# Patient Record
Sex: Male | Born: 1950
Health system: Southern US, Community
[De-identification: ages and names within clinical notes are randomized; demographics above are authoritative.]

## PROBLEM LIST (undated history)

## (undated) DIAGNOSIS — I10 Essential (primary) hypertension: Secondary | ICD-10-CM

## (undated) DIAGNOSIS — Q211 Atrial septal defect: Secondary | ICD-10-CM

## (undated) DIAGNOSIS — E785 Hyperlipidemia, unspecified: Secondary | ICD-10-CM

## (undated) DIAGNOSIS — I341 Nonrheumatic mitral (valve) prolapse: Secondary | ICD-10-CM

## (undated) DIAGNOSIS — Z9289 Personal history of other medical treatment: Secondary | ICD-10-CM

## (undated) DIAGNOSIS — R931 Abnormal findings on diagnostic imaging of heart and coronary circulation: Secondary | ICD-10-CM

## (undated) DIAGNOSIS — C801 Malignant (primary) neoplasm, unspecified: Secondary | ICD-10-CM

## (undated) HISTORY — DX: Personal history of other medical treatment: Z92.89

## (undated) HISTORY — DX: Atrial septal defect: Q21.1

## (undated) HISTORY — DX: Hyperlipidemia, unspecified: E78.5

## (undated) HISTORY — DX: Nonrheumatic mitral (valve) prolapse: I34.1

## (undated) HISTORY — DX: Abnormal findings on diagnostic imaging of heart and coronary circulation: R93.1

## (undated) HISTORY — PX: COLONOSCOPY: SHX174

## (undated) HISTORY — PX: I & D EXTREMITY: SHX5045

## (undated) HISTORY — DX: Malignant (primary) neoplasm, unspecified: C80.1

## (undated) HISTORY — DX: Essential (primary) hypertension: I10

---

## 1993-06-10 HISTORY — PX: PROSTATE SURGERY: SHX751

## 1998-06-02 ENCOUNTER — Emergency Department (HOSPITAL_COMMUNITY): Admission: EM | Admit: 1998-06-02 | Discharge: 1998-06-02 | Payer: Self-pay | Admitting: Internal Medicine

## 1998-06-02 ENCOUNTER — Encounter: Payer: Self-pay | Admitting: Internal Medicine

## 2002-03-22 ENCOUNTER — Ambulatory Visit (HOSPITAL_COMMUNITY): Admission: RE | Admit: 2002-03-22 | Discharge: 2002-03-22 | Payer: Self-pay | Admitting: Gastroenterology

## 2002-08-21 ENCOUNTER — Emergency Department (HOSPITAL_COMMUNITY): Admission: EM | Admit: 2002-08-21 | Discharge: 2002-08-21 | Payer: Self-pay | Admitting: Emergency Medicine

## 2002-08-22 ENCOUNTER — Emergency Department (HOSPITAL_COMMUNITY): Admission: EM | Admit: 2002-08-22 | Discharge: 2002-08-22 | Payer: Self-pay | Admitting: Emergency Medicine

## 2005-06-10 DIAGNOSIS — I341 Nonrheumatic mitral (valve) prolapse: Secondary | ICD-10-CM

## 2005-06-10 DIAGNOSIS — Q211 Atrial septal defect, unspecified: Secondary | ICD-10-CM

## 2005-06-10 HISTORY — DX: Nonrheumatic mitral (valve) prolapse: I34.1

## 2005-06-10 HISTORY — PX: HERNIA REPAIR: SHX51

## 2005-06-10 HISTORY — PX: MITRAL VALVE REPAIR: SHX2039

## 2005-06-10 HISTORY — DX: Atrial septal defect: Q21.1

## 2005-06-10 HISTORY — PX: CARDIAC CATHETERIZATION: SHX172

## 2005-06-10 HISTORY — DX: Atrial septal defect, unspecified: Q21.10

## 2006-01-20 ENCOUNTER — Ambulatory Visit: Payer: Self-pay | Admitting: Internal Medicine

## 2006-02-05 ENCOUNTER — Ambulatory Visit (HOSPITAL_COMMUNITY): Admission: RE | Admit: 2006-02-05 | Discharge: 2006-02-05 | Payer: Self-pay | Admitting: Cardiology

## 2006-02-05 ENCOUNTER — Encounter (INDEPENDENT_AMBULATORY_CARE_PROVIDER_SITE_OTHER): Payer: Self-pay | Admitting: Cardiology

## 2006-06-10 DIAGNOSIS — Z9289 Personal history of other medical treatment: Secondary | ICD-10-CM

## 2006-06-10 HISTORY — DX: Personal history of other medical treatment: Z92.89

## 2006-06-26 ENCOUNTER — Ambulatory Visit: Payer: Self-pay | Admitting: Psychology

## 2006-10-23 DIAGNOSIS — Z87898 Personal history of other specified conditions: Secondary | ICD-10-CM

## 2006-10-23 DIAGNOSIS — Z8546 Personal history of malignant neoplasm of prostate: Secondary | ICD-10-CM | POA: Insufficient documentation

## 2006-10-24 ENCOUNTER — Encounter: Payer: Self-pay | Admitting: Internal Medicine

## 2006-10-24 ENCOUNTER — Ambulatory Visit: Payer: Self-pay | Admitting: Internal Medicine

## 2006-12-19 ENCOUNTER — Encounter: Payer: Self-pay | Admitting: Internal Medicine

## 2007-09-28 ENCOUNTER — Telehealth: Payer: Self-pay | Admitting: Internal Medicine

## 2007-09-30 ENCOUNTER — Telehealth (INDEPENDENT_AMBULATORY_CARE_PROVIDER_SITE_OTHER): Payer: Self-pay | Admitting: *Deleted

## 2007-10-02 ENCOUNTER — Encounter: Payer: Self-pay | Admitting: Internal Medicine

## 2007-10-06 ENCOUNTER — Encounter: Payer: Self-pay | Admitting: Internal Medicine

## 2007-10-14 ENCOUNTER — Encounter: Payer: Self-pay | Admitting: Internal Medicine

## 2007-10-21 ENCOUNTER — Ambulatory Visit: Payer: Self-pay | Admitting: Internal Medicine

## 2007-10-21 DIAGNOSIS — C61 Malignant neoplasm of prostate: Secondary | ICD-10-CM

## 2007-10-21 DIAGNOSIS — E785 Hyperlipidemia, unspecified: Secondary | ICD-10-CM

## 2007-10-21 DIAGNOSIS — I080 Rheumatic disorders of both mitral and aortic valves: Secondary | ICD-10-CM

## 2007-10-21 LAB — CONVERTED CEMR LAB
Bilirubin Urine: NEGATIVE
Blood in Urine, dipstick: NEGATIVE
Glucose, Urine, Semiquant: NEGATIVE
Ketones, urine, test strip: NEGATIVE
Nitrite: NEGATIVE
Protein, U semiquant: NEGATIVE
Specific Gravity, Urine: 1.01
Urobilinogen, UA: NEGATIVE
WBC Urine, dipstick: NEGATIVE
pH: 6

## 2008-09-02 ENCOUNTER — Ambulatory Visit: Payer: Self-pay | Admitting: Internal Medicine

## 2008-10-04 ENCOUNTER — Encounter (INDEPENDENT_AMBULATORY_CARE_PROVIDER_SITE_OTHER): Payer: Self-pay | Admitting: *Deleted

## 2008-10-12 ENCOUNTER — Encounter: Payer: Self-pay | Admitting: Internal Medicine

## 2008-10-17 ENCOUNTER — Ambulatory Visit: Payer: Self-pay | Admitting: Internal Medicine

## 2008-10-17 LAB — CONVERTED CEMR LAB
Bilirubin Urine: NEGATIVE
Blood in Urine, dipstick: NEGATIVE
Glucose, Urine, Semiquant: NEGATIVE
Ketones, urine, test strip: NEGATIVE
Nitrite: NEGATIVE
Protein, U semiquant: NEGATIVE
Specific Gravity, Urine: 1.02
Urobilinogen, UA: 0.2
WBC Urine, dipstick: NEGATIVE
pH: 6

## 2008-11-01 ENCOUNTER — Encounter: Payer: Self-pay | Admitting: Internal Medicine

## 2009-02-22 ENCOUNTER — Encounter: Payer: Self-pay | Admitting: Internal Medicine

## 2009-06-22 ENCOUNTER — Telehealth (INDEPENDENT_AMBULATORY_CARE_PROVIDER_SITE_OTHER): Payer: Self-pay | Admitting: *Deleted

## 2010-06-13 ENCOUNTER — Encounter: Payer: Self-pay | Admitting: Internal Medicine

## 2010-06-20 ENCOUNTER — Encounter: Payer: Self-pay | Admitting: Internal Medicine

## 2010-06-27 ENCOUNTER — Encounter: Payer: Self-pay | Admitting: Internal Medicine

## 2010-06-29 ENCOUNTER — Encounter: Payer: Self-pay | Admitting: Internal Medicine

## 2010-06-29 ENCOUNTER — Other Ambulatory Visit: Payer: Self-pay | Admitting: Internal Medicine

## 2010-06-29 ENCOUNTER — Ambulatory Visit
Admission: RE | Admit: 2010-06-29 | Discharge: 2010-06-29 | Payer: Self-pay | Source: Home / Self Care | Attending: Internal Medicine | Admitting: Internal Medicine

## 2010-06-29 DIAGNOSIS — I10 Essential (primary) hypertension: Secondary | ICD-10-CM | POA: Insufficient documentation

## 2010-06-29 DIAGNOSIS — R51 Headache: Secondary | ICD-10-CM | POA: Insufficient documentation

## 2010-06-29 DIAGNOSIS — R519 Headache, unspecified: Secondary | ICD-10-CM | POA: Insufficient documentation

## 2010-06-29 DIAGNOSIS — R5383 Other fatigue: Secondary | ICD-10-CM

## 2010-06-29 DIAGNOSIS — R5381 Other malaise: Secondary | ICD-10-CM | POA: Insufficient documentation

## 2010-06-29 LAB — CBC WITH DIFFERENTIAL/PLATELET
Basophils Absolute: 0 10*3/uL (ref 0.0–0.1)
Basophils Relative: 0.4 % (ref 0.0–3.0)
Eosinophils Absolute: 0.1 10*3/uL (ref 0.0–0.7)
Eosinophils Relative: 1.1 % (ref 0.0–5.0)
HCT: 43 % (ref 39.0–52.0)
Hemoglobin: 14.6 g/dL (ref 13.0–17.0)
Lymphocytes Relative: 19.2 % (ref 12.0–46.0)
Lymphs Abs: 1.5 10*3/uL (ref 0.7–4.0)
MCHC: 33.8 g/dL (ref 30.0–36.0)
MCV: 90 fl (ref 78.0–100.0)
Monocytes Absolute: 0.7 10*3/uL (ref 0.1–1.0)
Monocytes Relative: 8.9 % (ref 3.0–12.0)
Neutro Abs: 5.5 10*3/uL (ref 1.4–7.7)
Neutrophils Relative %: 70.4 % (ref 43.0–77.0)
Platelets: 222 10*3/uL (ref 150.0–400.0)
RBC: 4.78 Mil/uL (ref 4.22–5.81)
RDW: 13.6 % (ref 11.5–14.6)
WBC: 7.9 10*3/uL (ref 4.5–10.5)

## 2010-06-29 LAB — SEDIMENTATION RATE: Sed Rate: 10 mm/hr (ref 0–22)

## 2010-07-02 ENCOUNTER — Telehealth (INDEPENDENT_AMBULATORY_CARE_PROVIDER_SITE_OTHER): Payer: Self-pay | Admitting: *Deleted

## 2010-07-10 NOTE — Progress Notes (Signed)
Summary: vaccine info  Phone Note Call from Patient   Caller: Patient email Summary of Call: pt sent dr hopper E-mail wanting to know if we have certain vaccine(hep A&B, tetanus,typhoid) that he need for his [place] trip. left message to call office................Marland KitchenFelecia Deloach CMA  June 22, 2009 10:33 AM  left message to call office..................Marland KitchenFelecia Deloach CMA  June 23, 2009 8:33 AM  left message to call office.................Marland KitchenFelecia Deloach CMA  June 26, 2009 8:22 AM   Follow-up for Phone Call        pt return call pt need to know last tetanus shot pt already recieved other vaccine [facility] pt advise of information...........Marland KitchenFelecia Deloach CMA  June 27, 2009 11:06 AM

## 2010-07-12 NOTE — Progress Notes (Signed)
Summary: Lab results  Phone Note Outgoing Call Call back at [facility] Phone [phone]   Call placed by: Shonna Chock CMA,  July 02, 2010 4:54 PM Call placed to: Patient Details for Reason: Lab Results Summary of Call: Spoke with patient, all information below ok'd. Copy of labs mailed  Great results; no blood count abnormalities; sed rate measures inflammation . This low value rules out Polymyalgia Rheumatica. Please report any change in symptoms or progression. William Lewis CMA  July 02, 2010 4:55 PM

## 2010-07-12 NOTE — Assessment & Plan Note (Signed)
Summary: headache, achey, nausea, ///sph   Vital Signs:  Patient profile:   60 year old male Height:      71.5 inches (181.61 cm) Weight:      219.13 pounds (99.60 kg) BMI:     30.25 Temp:     98.1 degrees F (36.72 degrees C) oral Resp:     15 per minute BP sitting:   140 / 100  (left arm) Cuff size:   regular  Vitals Entered By: Lucious Groves CMA (June 29, 2010 12:30 PM) CC: Not feeling well-Acut onset of HA, nausea, and neck ache that began on Monday./kb, Headaches Is Patient Diabetic? No Comments Patient denies fever, vomiting, abd pain, cough, and congestion. It seems to be worsened by coffee and somewhat relieved by Advil.   CC:  Not feeling well-Acut onset of HA, nausea, and neck ache that began on Monday./kb, and Headaches.  History of Present Illness:    Onset 01/16 as malaise followed by joint & muscle pain , especially  upper back & neck with headache. The patient reports nausea, but denies vomiting, sweats, tearing of eyes, nasal congestion, sinus pain, sinus pressure, photophobia, and phonophobia.  The headache is described as intermittent and dull.  The location of the pain is  bitemporal as "pressure" .  High-risk features (red flags) include new type of headache and age >50 years.  The patient denies the following high-risk features: fever, vision loss or change, focal weakness, altered mental status, rash, and pain worse with exertion.  The headaches are precipitated by  coffee.  Prior treatment has included a NSAID with some response. No PMH of migraines; "I never get headaches".  He had Flu shot in Oct 2011. BP was 128/90 @ Dr Landry Dyke 01/18; Altace was increased.  Current Medications (verified): 1)  Metoprolol Succinate 25 Mg Xr24h-Tab (Metoprolol Succinate) .... 1/2 Tab Two Times A Day 2)  Altace 5 Mg Caps (Ramipril) .Marland Kitchen.. 1 By Mouth Once Daily 3)  Crestor 10 Mg Tabs (Rosuvastatin Calcium) .Marland Kitchen.. 1 By Mouth Qd 4)  Asa 81mg  .... 1 By Mouth Once Daily  Allergies  (verified): No Known Drug Allergies  Physical Exam  General:  well-nourished,in no acute distress; alert,appropriate and cooperative throughout examination Head:  Normocephalic and atraumatic without obvious abnormalities. No  temple or cervical spine tenderness Eyes:  No corneal or conjunctival inflammation noted. EOMI. Perrla. Field of Vision grossly normal.Slight ptosis OS Ears:  External ear exam shows no significant lesions or deformities.  Otoscopic examination reveals clear canals, tympanic membranes are intact bilaterally without bulging, retraction, inflammation or discharge. Hearing is grossly normal bilaterally.Some wax Nose:  External nasal examination shows no deformity or inflammation. Nasal mucosa are pink and moist without lesions or exudates. Mouth:  Oral mucosa and oropharynx without lesions or exudates.  Teeth in good repair. No tongue deviation. Slight decrease L nasolabial fold Neck:  No deformities, masses, or tenderness noted. Slight pain with L lat rotation Heart:  Normal rate and regular rhythm. S1 and S2 normal without gallop, murmur, click, rub or other extra sounds. Pulses:  R and L carotid  pulses are full and equal bilaterally w/o bruits Neurologic:  alert & oriented X3, cranial nerves II-XII intact, strength normal in all extremities, sensation intact to light touch, gait normal, DTRs symmetrical and normal, finger-to-nose normal, and Romberg negative.   Skin:  Intact without suspicious lesions or rashes Cervical Nodes:  No lymphadenopathy noted Axillary Nodes:  No palpable lymphadenopathy Psych:  memory intact for recent and  remote, normally interactive, and good eye contact.     Impression & Recommendations:  Problem # 1:  HEADACHE (ICD-784.0) R/O PMR His updated medication list for this problem includes:    Metoprolol Succinate 25 Mg Xr24h-tab (Metoprolol succinate) .Marland Kitchen... 1/2 tab two times a day    Tramadol Hcl 50 Mg Tabs (Tramadol hcl) .Marland Kitchen... 1 every 6 hrs  as needed for headache  Orders: Venipuncture (04540) TLB-Sedimentation Rate (ESR) (85652-ESR) Prescription Created Electronically [phone])  Problem # 2:  MALAISE (ICD-780.79)  Orders: Venipuncture (14782) TLB-CBC Platelet - w/Differential (85025-CBCD)  Problem # 3:  HYPERTENSION (ICD-401.9) suboptimal control His updated medication list for this problem includes:    Metoprolol Succinate 25 Mg Xr24h-tab (Metoprolol succinate) .Marland Kitchen... 1/2 tab two times a day    Altace 5 Mg Caps (Ramipril) .Marland Kitchen... 1 by mouth once daily  Complete Medication List: 1)  Metoprolol Succinate 25 Mg Xr24h-tab (Metoprolol succinate) .... 1/2 tab two times a day 2)  Altace 5 Mg Caps (Ramipril) .Marland Kitchen.. 1 by mouth once daily 3)  Crestor 10 Mg Tabs (Rosuvastatin calcium) .Marland Kitchen.. 1 by mouth qd 4)  Asa 81mg   .... 1 by mouth once daily 5)  Tramadol Hcl 50 Mg Tabs (Tramadol hcl) .Marland Kitchen.. 1 every 6 hrs as needed for headache  Patient Instructions: 1)  Check your Blood Pressure regularly. If it is above: 135/85 ON AVERAGE increase Metoprolol to  25 mg two times a day . Prescriptions: TRAMADOL HCL 50 MG TABS (TRAMADOL HCL) 1 every 6 hrs as needed for headache  #30 x 0   Entered and Authorized by:   Marga Melnick MD   Signed by:   Marga Melnick MD on 06/29/2010   Method used:   Electronically to        [facility]. [phone]* (retail)       [address]       [place], [place]  [address]       Ph: [phone]       Fax: [phone]   RxID:   [id number]    Orders Added: 1)  Est. Patient Level IV [95638] 2)  Venipuncture [75643] 3)  TLB-CBC Platelet - w/Differential [85025-CBCD] 4)  TLB-Sedimentation Rate (ESR) [85652-ESR] 5)  Prescription Created Electronically [phone]  Appended Document: headache, achey, nausea, ///sph

## 2010-07-18 NOTE — Letter (Signed)
Summary: E Mail from Patient with Concerns  E Mail from Patient with Concerns   Imported By: Lanelle Bal 07/10/2010 12:25:24  _____________________________________________________________________  External Attachment:    Type:   Image     Comment:   External Document

## 2010-07-26 NOTE — Letter (Signed)
Summary: [facility]  [facility]   Imported By: Lanelle Bal 07/16/2010 14:14:30  _____________________________________________________________________  External Attachment:    Type:   Image     Comment:   External Document

## 2010-10-26 NOTE — Letter (Signed)
January 28, 2006     Lennette Bihari, MD  [address]  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  At that time, I felt that his murmur was at least a 1-1/2 to a 2 on  a scale of 6.  At his last FAA exam, the murmur was graded as a grade 1/2.   When I obtained your evaluation of November 13, 2005, you also felt that there  was at least a grade 2 systolic murmur and had recommended a transesophageal  echocardiogram for further evaluation.  Specifically, you were concerned  about valvular heart disease or a patent foramen ovale.   With this significant increase in the grade of the murmur, he cannot be  cleared for [facility] certification until your evaluation is completed and the FAA  has had a chance to review this data.   I called him personally and informed him of this;  I will help him pursue  the certification after we have your complete evaluation.   Sincerely,     Titus Dubin. Alwyn Ren, MD, FACP, [facility]   [facility]  DD:  01/28/2006  DT:  01/29/2006  Job #:  [id number]  CC:   Mosetta Putt, MD  [name]

## 2011-03-26 ENCOUNTER — Encounter: Payer: Self-pay | Admitting: Internal Medicine

## 2011-03-27 ENCOUNTER — Encounter: Payer: Self-pay | Admitting: Internal Medicine

## 2011-03-27 ENCOUNTER — Ambulatory Visit (INDEPENDENT_AMBULATORY_CARE_PROVIDER_SITE_OTHER): Payer: BC Managed Care – PPO | Admitting: Internal Medicine

## 2011-03-27 VITALS — BP 130/84 | HR 65 | Temp 98.3°F | Resp 12 | Ht 72.0 in | Wt 215.4 lb

## 2011-03-27 DIAGNOSIS — I10 Essential (primary) hypertension: Secondary | ICD-10-CM

## 2011-03-27 DIAGNOSIS — Z23 Encounter for immunization: Secondary | ICD-10-CM

## 2011-03-27 DIAGNOSIS — Z8546 Personal history of malignant neoplasm of prostate: Secondary | ICD-10-CM

## 2011-03-27 DIAGNOSIS — I080 Rheumatic disorders of both mitral and aortic valves: Secondary | ICD-10-CM

## 2011-03-27 DIAGNOSIS — Z125 Encounter for screening for malignant neoplasm of prostate: Secondary | ICD-10-CM

## 2011-03-27 DIAGNOSIS — Z0000 Encounter for general adult medical examination without abnormal findings: Secondary | ICD-10-CM

## 2011-03-27 DIAGNOSIS — E785 Hyperlipidemia, unspecified: Secondary | ICD-10-CM

## 2011-03-27 NOTE — Patient Instructions (Addendum)
Preventive Health Care: Exercise at least 30-45 minutes a day,  3-4 days a week.  Eat a low-fat diet with lots of fruits and vegetables, up to 7-9 servings per day. Consume less than 40 grams of sugar per day from foods & drinks with High Fructose Corn Sugar as # 1,2,3 or # 4 on label. Recommended annual  fasting Labs : BMET,Lipids, hepatic panel, CBC & dif, TSH, PSA.  Blood Pressure Goal  Ideally is an AVERAGE < 135/85. This AVERAGE should be calculated from @ least 5-7 BP readings taken @ different times of day on different days of week. You should not respond to isolated BP readings , but rather the AVERAGE for that week

## 2011-03-27 NOTE — Progress Notes (Signed)
  Subjective:    Patient ID: William Lewis, male    DOB: February 05, 1951, 60 y.o.   MRN: [id number]  HPI  William Lewis  is here for a physical; he has no acute issues.     Review of Systems HYPERTENSION: Disease Monitoring: Blood pressure range-not monitored regularly  Chest pain, palpitations- no      Dyspnea- no Medications: Compliance- yes  Lightheadedness,Syncope- no    Edema- no  HYPERLIPIDEMIA: Medications: Compliance- yes  Abd pain, bowel changes- no   Muscle aches- no        Objective:   Physical Exam Gen.: Healthy and well-nourished in appearance. Alert, appropriate and cooperative throughout exam. Head: Normocephalic without obvious abnormalities Eyes: No corneal or conjunctival inflammation noted. Pupils equal round reactive to light and accommodation. Fundal exam is benign without hemorrhages, exudate, papilledema. Extraocular motion intact. Vision grossly normal. Ears: External  ear exam reveals no significant lesions or deformities. Canals clear .TMs normal. Hearing is grossly normal bilaterally. Nose: External nasal exam reveals no deformity or inflammation. Nasal mucosa are pink and moist. No lesions or exudates noted.  Mouth: Oral mucosa and oropharynx reveal no lesions or exudates. Teeth in good repair. Neck: No deformities, masses, or tenderness noted. Range of motion &  Thyroid normal. Lungs: Normal respiratory effort; chest expands symmetrically. Lungs are clear to auscultation without rales, wheezes, or increased work of breathing. Heart: Normal rate and rhythm. Normal S1 and S2. No gallop, click, or rub. S 4 with slight slurring ; no  murmur. Abdomen: Bowel sounds normal; abdomen soft and nontender. No masses, organomegaly or hernias noted. Genitalia/ DRE: negative genitalia except for mild varicoele on L ; PSA drawn   .                                                                                   Musculoskeletal/extremities: No deformity or scoliosis noted  of  the thoracic or lumbar spine. No clubbing, cyanosis, edema, or deformity noted. Range of motion  normal .Tone & strength  normal.Joints normal. Nail health  good. Vascular: Carotid, radial artery, dorsalis pedis and  posterior tibial pulses are full and equal. No bruits present. Neurologic: Alert and oriented x3. Deep tendon reflexes symmetrical and normal.          Skin: Intact without suspicious lesions or rashes. 3 x 3 mm papule 4th finger Lymph: No cervical, axillary, or inguinal lymphadenopathy present. Psych: Mood and affect are normal. Normally interactive                                                                                         Assessment & Plan:  #1 comprehensive physical exam; no acute findings #2 see Problem List with Assessments & Recommendations Plan: see Orders

## 2011-06-11 DIAGNOSIS — R931 Abnormal findings on diagnostic imaging of heart and coronary circulation: Secondary | ICD-10-CM

## 2011-06-11 HISTORY — DX: Abnormal findings on diagnostic imaging of heart and coronary circulation: R93.1

## 2011-12-23 ENCOUNTER — Ambulatory Visit (INDEPENDENT_AMBULATORY_CARE_PROVIDER_SITE_OTHER): Payer: BC Managed Care – PPO | Admitting: Family Medicine

## 2011-12-23 ENCOUNTER — Encounter: Payer: Self-pay | Admitting: Family Medicine

## 2011-12-23 VITALS — BP 132/80 | HR 60 | Temp 98.2°F | Ht 72.0 in | Wt 215.3 lb

## 2011-12-23 DIAGNOSIS — R5383 Other fatigue: Secondary | ICD-10-CM

## 2011-12-23 DIAGNOSIS — R5381 Other malaise: Secondary | ICD-10-CM

## 2011-12-23 DIAGNOSIS — R0789 Other chest pain: Secondary | ICD-10-CM

## 2011-12-23 NOTE — Progress Notes (Signed)
  Subjective:    Patient ID: William Lewis, male    DOB: February 23, 1951, 61 y.o.   MRN: [id number]  HPI Chest tightness and fatigue- 'i haven't been feeling that well.  i don't feel really bad, just a lack of energy'.  sxs started 2-4 weeks ago.  Has hx of mitral valve repair 2007- sees Dr Tresa Endo (cards).  Last seen 8 weeks ago and started on Vit D.  Played golf yesterday w/out difficulty.  Chest tightness is independent of exercise.  Admits to increased stress over the last few months.  Mild SOB at times.  No difference in sleep patterns/habits.  But is waking up and 'over analyzing things'.  No CP.  Chest tightness has no notable relation to food.  sxs seem to improve while golfing.   Review of Systems For ROS see HPI     Objective:   Physical Exam  Constitutional: He is oriented to person, place, and time. He appears well-developed and well-nourished. No distress.  HENT:  Head: Normocephalic and atraumatic.  Eyes: Conjunctivae and EOM are normal. Pupils are equal, round, and reactive to light.  Neck: Normal range of motion. Neck supple. No thyromegaly present.  Cardiovascular: Normal rate, regular rhythm and intact distal pulses.   Murmur (faint mitral click) heard. Pulmonary/Chest: Effort normal and breath sounds normal. No respiratory distress.  Abdominal: Soft. Bowel sounds are normal. He exhibits no distension.  Musculoskeletal: He exhibits no edema.  Lymphadenopathy:    He has no cervical adenopathy.  Neurological: He is alert and oriented to person, place, and time. No cranial nerve deficit.  Skin: Skin is warm and dry.  Psychiatric: He has a normal mood and affect. His behavior is normal.          Assessment & Plan:

## 2011-12-23 NOTE — Patient Instructions (Addendum)
We'll notify you of your lab results Try and keep track of where and when symptoms occur, how long your symptoms last, what makes them better or worse, etc This may all be stress related- try and find an outlet! Call with any questions or concerns Hang in there!

## 2011-12-24 LAB — CBC WITH DIFFERENTIAL/PLATELET
Basophils Absolute: 0 10*3/uL (ref 0.0–0.1)
Basophils Relative: 0.5 % (ref 0.0–3.0)
Eosinophils Absolute: 0.1 10*3/uL (ref 0.0–0.7)
Lymphocytes Relative: 21.9 % (ref 12.0–46.0)
MCHC: 32.5 g/dL (ref 30.0–36.0)
MCV: 90 fl (ref 78.0–100.0)
Monocytes Absolute: 0.8 10*3/uL (ref 0.1–1.0)
Neutrophils Relative %: 63.7 % (ref 43.0–77.0)
Platelets: 212 10*3/uL (ref 150.0–400.0)
RBC: 4.59 Mil/uL (ref 4.22–5.81)
RDW: 13.9 % (ref 11.5–14.6)

## 2011-12-24 LAB — BASIC METABOLIC PANEL
BUN: 19 mg/dL (ref 6–23)
GFR: 74.25 mL/min (ref >60.00)
Potassium: 4.7 mEq/L (ref 3.5–5.1)

## 2011-12-24 NOTE — Assessment & Plan Note (Signed)
New.  Since pt's sxs improve w/ activity and worsen w/ stress suspect that this is anxiety related.  EKG done- see document for interpretation- no cause for chest tightness identified.  Pt to try and monitor sxs- if they don't improve, will refer to cards for complete evaluation.  Reviewed supportive care and red flags that should prompt return.  Pt expressed understanding and is in agreement w/ plan.

## 2011-12-24 NOTE — Assessment & Plan Note (Signed)
New.  Check labs to r/o metabolic cause but again suspect this is due to pt's current stress level and anxiety.  Encouraged him to continue his regular exercise as this will be a good release.  Pt expressed understanding and is in agreement w/ plan.

## 2011-12-25 LAB — TSH: TSH: 2.25 u[IU]/mL (ref 0.35–5.50)

## 2012-03-30 ENCOUNTER — Encounter: Payer: Self-pay | Admitting: Internal Medicine

## 2012-03-30 ENCOUNTER — Ambulatory Visit (INDEPENDENT_AMBULATORY_CARE_PROVIDER_SITE_OTHER): Payer: BC Managed Care – PPO | Admitting: Internal Medicine

## 2012-03-30 VITALS — BP 126/84 | HR 70 | Temp 98.4°F | Resp 12 | Ht 72.5 in | Wt 213.4 lb

## 2012-03-30 DIAGNOSIS — E559 Vitamin D deficiency, unspecified: Secondary | ICD-10-CM

## 2012-03-30 NOTE — Patient Instructions (Addendum)
Preventive Health Care: Exercise at least 30-45 minutes a day,  3-4 days a week.  Eat a low-fat diet with lots of fruits and vegetables, up to 7-9 servings per day.  Consume less than 40 grams of sugar per day from foods & drinks with High Fructose Corn Sugar as # 1,2,3 or # 4 on label. Health Care Power of Attorney & Living Will. Complete if not in place ; these place you in charge of your health care decisions. Apply Cort Aid OTC twice a day to the involved tissues. Do not get this topical steroid into eyes. Use hypoallergenic cleansing motions.  Please  schedule fasting Labs : Lipids, hepatic panel, PSA. PLEASE BRING THESE INSTRUCTIONS TO FOLLOW UP  LAB APPOINTMENT.This will guarantee correct labs are drawn, eliminating need for repeat blood sampling ( needle sticks ! ). Diagnoses /Codes: V70.0  If you activate My Chart; the results can be released to you as soon as they populate from the lab. If you choose not to use this program; the labs have to be reviewed, copied & mailed   causing a delay in getting the results to you.

## 2012-03-30 NOTE — Progress Notes (Signed)
  Subjective:    Patient ID: William Lewis, male    DOB: 10/04/1950, 61 y.o.   MRN: [id number]  HPI William Lewis  is here for a physical; he has no acute issues .      Review of Systems Based on the results of the advanced cholesterol test; he was placed on 50,000 international units of vitamin D 3 for 6-8 weeks by his cardiologist. He has presently on 2000 international units daily. He is on a heart healthy diet and exercises on a regular basis. He walks at least a mile per day in addition to playing tennis. He denies associated chest pain, palpitations, dyspnea, or claudication. He is compliant with his statin; he denies abdominal pain, bowel change, or myalgias.  His wife noted a rash over the lower back area several days ago. It is nonpruritic; there is no historical clue to its etiology. He denies extrinsic symptoms such as itchy, watery eyes; sneezing; wheezing, or angioedema    Objective:   Physical Exam Gen.: Healthy and well-nourished in appearance. Alert, appropriate and cooperative throughout exam. Head: Normocephalic without obvious abnormalities  Eyes: No corneal or conjunctival inflammation noted. Pupils equal round reactive to light and accommodation. Fundal exam is benign without hemorrhages, exudate, papilledema. Extraocular motion intact. Vision grossly normal. Ears: External  ear exam reveals no significant lesions or deformities. Canals clear .TMs normal. Hearing is grossly normal bilaterally. Nose: External nasal exam reveals no deformity or inflammation. Nasal mucosa are pink and moist. No lesions or exudates noted.   Mouth: Oral mucosa and oropharynx reveal no lesions or exudates. Teeth in good repair. Neck: No deformities, masses, or tenderness noted. Range of motion & Thyroid normal. Lungs: Normal respiratory effort; chest expands symmetrically. Lungs are clear to auscultation without rales, wheezes, or increased work of breathing. Heart: Normal rate and rhythm. Normal  S1 and S2. No gallop, click, or rub. NO murmur. Abdomen: Bowel sounds normal; abdomen soft and nontender. No masses, organomegaly or hernias noted. Genitalia/DRE: Prostate surgically absent. Genital exam unremarkable except for small varices on the left Musculoskeletal/extremities: No deformity or scoliosis noted of  the thoracic or lumbar spine. No clubbing, cyanosis, edema, or deformity noted. Range of motion  normal .Tone & strength  normal.Joints normal. Nail health  good. Vascular: Carotid, radial artery, dorsalis pedis and  posterior tibial pulses are full and equal. No bruits present. Neurologic: Alert and oriented x3. Deep tendon reflexes symmetrical and normal.          Skin: She has scattered truncal plaques which are faintly pink and blanch. The largest is 3 x 5 cm over the lower mid back. Lymph: No cervical, axillary, or inguinal lymphadenopathy present. Psych: Mood and affect are normal. Normally interactive                                                                                          Assessment & Plan:  #1 comprehensive physical exam #2 truncal rash is asymptomatic; blanching characteristics suggest vascular etiology. #3 dyslipidemia monitored by his cardiologist. A copy of the  [facility] panel will be requested for his chart  Plan: see Orders

## 2012-03-31 ENCOUNTER — Other Ambulatory Visit (INDEPENDENT_AMBULATORY_CARE_PROVIDER_SITE_OTHER): Payer: BC Managed Care – PPO

## 2012-03-31 DIAGNOSIS — Z0000 Encounter for general adult medical examination without abnormal findings: Secondary | ICD-10-CM

## 2012-03-31 DIAGNOSIS — E559 Vitamin D deficiency, unspecified: Secondary | ICD-10-CM

## 2012-03-31 LAB — HEPATIC FUNCTION PANEL
ALT: 21 U/L (ref 0–53)
AST: 23 U/L (ref 0–37)
Bilirubin, Direct: 0.1 mg/dL (ref 0.0–0.3)
Total Bilirubin: 0.6 mg/dL (ref 0.3–1.2)
Total Protein: 6.9 g/dL (ref 6.0–8.3)

## 2012-03-31 LAB — LIPID PANEL
Cholesterol: 199 mg/dL (ref 0–200)
HDL: 57.5 mg/dL (ref >39.00)

## 2012-04-02 LAB — PSA: PSA: 0 ng/mL — ABNORMAL LOW (ref 0.10–4.00)

## 2012-04-05 ENCOUNTER — Encounter: Payer: Self-pay | Admitting: Internal Medicine

## 2012-04-06 LAB — VITAMIN D 1,25 DIHYDROXY: Vitamin D2 1, 25 (OH)2: 14 pg/mL

## 2012-04-20 ENCOUNTER — Encounter: Payer: Self-pay | Admitting: Internal Medicine

## 2012-04-21 NOTE — Telephone Encounter (Signed)
I have had same scheduling experience with that office if it makes you feel any better

## 2012-09-24 ENCOUNTER — Other Ambulatory Visit (HOSPITAL_COMMUNITY): Payer: Self-pay | Admitting: Cardiovascular Disease

## 2012-09-24 DIAGNOSIS — I10 Essential (primary) hypertension: Secondary | ICD-10-CM

## 2012-10-07 ENCOUNTER — Ambulatory Visit (HOSPITAL_COMMUNITY): Payer: BC Managed Care – PPO

## 2013-02-19 ENCOUNTER — Telehealth: Payer: Self-pay | Admitting: Cardiovascular Disease

## 2013-02-19 DIAGNOSIS — R5381 Other malaise: Secondary | ICD-10-CM

## 2013-02-19 DIAGNOSIS — E782 Mixed hyperlipidemia: Secondary | ICD-10-CM

## 2013-02-19 DIAGNOSIS — Z79899 Other long term (current) drug therapy: Secondary | ICD-10-CM

## 2013-02-19 NOTE — Telephone Encounter (Signed)
Would like to know if he needs to have lab work done before his visi t in November .Marland Kitchen If so please send him an order for lab work .   Thanks

## 2013-02-19 NOTE — Telephone Encounter (Signed)
Lab(s) ordered and directions to present FASTING to any [facility] to have completed.  Lab slip mailed.

## 2013-02-26 ENCOUNTER — Encounter: Payer: Self-pay | Admitting: Internal Medicine

## 2013-04-13 ENCOUNTER — Telehealth: Payer: Self-pay

## 2013-04-13 NOTE — Telephone Encounter (Addendum)
Left message for call back Identifiable  Medication List and allergies: reviewed and updated  90 day supply/mail order: [facility] Local prescriptions: [facility] [place]  Immunizations due: UTD  A/P:   No changes to FH or PSH (brother committed suicide) CCS--06/2012--Dr Medoff--polyps--2019 Tdap--08/2008 Zostavax--03/2011 PSA--03/2012--low 0.00 Flu Vaccine--02/2013  To Discuss with Provider: Has questions/will discuss with provider

## 2013-04-14 ENCOUNTER — Encounter: Payer: Self-pay | Admitting: Internal Medicine

## 2013-04-14 ENCOUNTER — Ambulatory Visit (INDEPENDENT_AMBULATORY_CARE_PROVIDER_SITE_OTHER): Payer: BC Managed Care – PPO | Admitting: Internal Medicine

## 2013-04-14 VITALS — BP 126/76 | HR 80 | Temp 98.8°F | Wt 214.0 lb

## 2013-04-14 DIAGNOSIS — E559 Vitamin D deficiency, unspecified: Secondary | ICD-10-CM

## 2013-04-14 DIAGNOSIS — R5381 Other malaise: Secondary | ICD-10-CM

## 2013-04-14 DIAGNOSIS — Z8546 Personal history of malignant neoplasm of prostate: Secondary | ICD-10-CM

## 2013-04-14 DIAGNOSIS — Z79899 Other long term (current) drug therapy: Secondary | ICD-10-CM

## 2013-04-14 DIAGNOSIS — Z0000 Encounter for general adult medical examination without abnormal findings: Secondary | ICD-10-CM

## 2013-04-14 DIAGNOSIS — R5383 Other fatigue: Secondary | ICD-10-CM

## 2013-04-14 DIAGNOSIS — Z86010 Personal history of colonic polyps: Secondary | ICD-10-CM | POA: Insufficient documentation

## 2013-04-14 DIAGNOSIS — N5231 Erectile dysfunction following radical prostatectomy: Secondary | ICD-10-CM

## 2013-04-14 DIAGNOSIS — N529 Male erectile dysfunction, unspecified: Secondary | ICD-10-CM

## 2013-04-14 LAB — CBC WITH DIFFERENTIAL/PLATELET
Basophils Absolute: 0 10*3/uL (ref 0.0–0.1)
Eosinophils Absolute: 0.1 10*3/uL (ref 0.0–0.7)
Eosinophils Relative: 1.4 % (ref 0.0–5.0)
HCT: 42.7 % (ref 39.0–52.0)
Lymphocytes Relative: 17.1 % (ref 12.0–46.0)
Lymphs Abs: 1.4 10*3/uL (ref 0.7–4.0)
Monocytes Relative: 7.8 % (ref 3.0–12.0)
Neutro Abs: 6 10*3/uL (ref 1.4–7.7)
Neutrophils Relative %: 73.2 % (ref 43.0–77.0)
Platelets: 230 10*3/uL (ref 150.0–400.0)
RDW: 14.5 % (ref 11.5–14.6)
WBC: 8.1 10*3/uL (ref 4.5–10.5)

## 2013-04-14 LAB — LIPID PANEL
Cholesterol: 179 mg/dL (ref 0–200)
HDL: 62.3 mg/dL (ref >39.00)
Total CHOL/HDL Ratio: 3
Triglycerides: 122 mg/dL (ref 0.0–149.0)
VLDL: 24.4 mg/dL (ref 0.0–40.0)

## 2013-04-14 LAB — COMPREHENSIVE METABOLIC PANEL
ALT: 21 U/L (ref 0–53)
BUN: 17 mg/dL (ref 6–23)
CO2: 27 mEq/L (ref 19–32)
Calcium: 9.5 mg/dL (ref 8.4–10.5)
Chloride: 102 mEq/L (ref 96–112)
GFR: 77.27 mL/min (ref >60.00)
Glucose, Bld: 89 mg/dL (ref 70–99)
Sodium: 137 mEq/L (ref 135–145)
Total Bilirubin: 0.4 mg/dL (ref 0.3–1.2)
Total Protein: 7 g/dL (ref 6.0–8.3)

## 2013-04-14 LAB — TSH: TSH: 2.39 u[IU]/mL (ref 0.35–5.50)

## 2013-04-14 LAB — PSA: PSA: 0 ng/mL — ABNORMAL LOW (ref 0.10–4.00)

## 2013-04-14 NOTE — Patient Instructions (Signed)
Your next office appointment will be determined based upon review of your pending labs. Those instructions will be transmitted to you through My Chart . 

## 2013-04-14 NOTE — Progress Notes (Signed)
  Subjective:    Patient ID: William Lewis, male    DOB: 05-Feb-1951, 62 y.o.   MRN: [id number]  HPI  He is here for a physical;acute issues denied.     Review of Systems A heart healthy diet is followed; exercise encompasses 30-60 minutes 2-3  times per week as walking, tennis, golf & biking without symptoms.  Family history is negative for premature coronary disease. There is medication compliance with the statin.  Low dose ASA taken. Specifically denied are  chest pain, palpitations, dyspnea, or claudication.  Significant abdominal symptoms, memory deficit, or myalgias not present.     Objective:   Physical Exam Gen.: Healthy and well-nourished in appearance. Alert, appropriate and cooperative throughout exam.  Head: Normocephalic without obvious abnormalities  Eyes: No corneal or conjunctival inflammation noted. Pupils equal round reactive to light and accommodation. Extraocular motion intact.  Ears: External  ear exam reveals no significant lesions or deformities. Canals clear .TMs normal. Hearing is grossly normal bilaterally. Nose: External nasal exam reveals no deformity or inflammation. Nasal mucosa are pink and moist. No lesions or exudates noted.  Mouth: Oral mucosa and oropharynx reveal no lesions or exudates. Teeth in good repair. Neck: No deformities, masses, or tenderness noted. Range of motion & Thyroid normal. Lungs: Normal respiratory effort; chest expands symmetrically. Lungs are clear to auscultation without rales, wheezes, or increased work of breathing. Heart: Normal rate and rhythm. Normal S1 and S2. No gallop, click, or rub. S4 w/o murmur. Abdomen: Bowel sounds normal; abdomen soft and nontender. No masses, organomegaly or hernias noted. Genitalia: Genitalia normal except for left  granuloma & varices. Prostate absent Musculoskeletal/extremities: No deformity or scoliosis noted of  the thoracic or lumbar spine.   No clubbing, cyanosis, edema, or significant  extremity  deformity noted. Range of motion normal .Tone & strength normal. Post op changes R thumb Hand joints normal . Fingernail / toenail health good. Able to lie down & sit up w/o help. Negative SLR bilaterally Vascular: Carotid, radial artery,  and  posterior tibial pulses are full and equal.Dorsalis pedis decreased but good hair growth over the feet and no ischemic skin changes. No bruits present. Neurologic: Alert and oriented x3. Deep tendon reflexes symmetrical and normal.        Skin: Intact without suspicious lesions or rashes. Lymph: No cervical, axillary, or inguinal lymphadenopathy present. Psych: Mood and affect are normal. Normally interactive                                                                                        Assessment & Plan:  #1 comprehensive physical exam; no acute findings  Plan: see Orders  & Recommendations

## 2013-04-14 NOTE — Addendum Note (Signed)
Addended by: Silvio Pate D on: 04/14/2013 10:46 AM   Modules accepted: Orders

## 2013-04-18 ENCOUNTER — Encounter: Payer: Self-pay | Admitting: Internal Medicine

## 2013-04-18 LAB — VITAMIN D 1,25 DIHYDROXY
Vitamin D2 1, 25 (OH)2: <8 pg/mL
Vitamin D3 1, 25 (OH)2: 64 pg/mL

## 2013-04-26 ENCOUNTER — Encounter: Payer: Self-pay | Admitting: Cardiology

## 2013-04-26 ENCOUNTER — Ambulatory Visit (HOSPITAL_COMMUNITY)
Admission: RE | Admit: 2013-04-26 | Discharge: 2013-04-26 | Disposition: A | Discharge status: Home / Self Care | Payer: BC Managed Care – PPO | Source: Ambulatory Visit | Attending: Cardiology | Admitting: Cardiology

## 2013-04-26 DIAGNOSIS — E785 Hyperlipidemia, unspecified: Secondary | ICD-10-CM | POA: Insufficient documentation

## 2013-04-26 DIAGNOSIS — I10 Essential (primary) hypertension: Secondary | ICD-10-CM | POA: Insufficient documentation

## 2013-04-26 DIAGNOSIS — Z9889 Other specified postprocedural states: Secondary | ICD-10-CM | POA: Insufficient documentation

## 2013-04-26 NOTE — Progress Notes (Signed)
2D Echo Performed 04/26/2013    Augustin Bun, RCS  

## 2013-04-27 ENCOUNTER — Encounter: Payer: Self-pay | Admitting: Cardiovascular Disease

## 2013-04-28 ENCOUNTER — Encounter: Payer: Self-pay | Admitting: Cardiovascular Disease

## 2013-04-28 ENCOUNTER — Ambulatory Visit (INDEPENDENT_AMBULATORY_CARE_PROVIDER_SITE_OTHER): Payer: BC Managed Care – PPO | Admitting: Cardiovascular Disease

## 2013-04-28 VITALS — BP 138/88 | HR 71 | Ht 72.0 in | Wt 214.8 lb

## 2013-04-28 DIAGNOSIS — I10 Essential (primary) hypertension: Secondary | ICD-10-CM

## 2013-04-28 DIAGNOSIS — Z9889 Other specified postprocedural states: Secondary | ICD-10-CM

## 2013-04-28 DIAGNOSIS — E785 Hyperlipidemia, unspecified: Secondary | ICD-10-CM

## 2013-04-28 NOTE — Patient Instructions (Signed)
Your physician recommends that you schedule a follow-up appointment in: 1 YEAR. No changes were made today in your therapy. 

## 2013-04-28 NOTE — Progress Notes (Signed)
Patient ID: William Lewis, male   DOB: November 10, 1950, 62 y.o.   MRN: [id number]     HPI: William Lewis is a 62 y.o. male who presents to the office for a six-month cardiology followup evaluation.  William Lewis underwent mitral valve repair robotically by Dr. Lamount Cohen [facility] a 04/10/2006 for significant mitral valve prolapse with partial flail posterior P2 leaflet. 4+ preoperative mitral regurgitation and also had an ASD as well as a patent foramen ovale which were also successfully repaired. At the time of his surgery, his left atrial appendage was closed.  Preoperatively, he was documented to have normal coronary arteries by cardiac catheterization. He recently underwent a 18 month followup echo Doppler study which was done this week and continues to show excellent result. Left ventricular ejection fraction was 60-65% and he did not have regional wall motion abnormalities. His mitral annular ring prosthesis was present. There is no mention in his current echo of any residual mitral regurgitation whereas on his prior rectal [facility] 2013 he was felt to have mild MR. Alert other valvular anatomy was normal.  She has remained fairly active. He plays tennis several days per week and also does ride a bike. He has had a stressful year this past year with his brother dying and having to deal with his estate, as well as additional care taking responsibilities for a loss. He is unaware of any chest tightness episodes. He does have a history of vitamin D insufficiency but read but recent laboratory done by Dr. Alwyn Ren now reveals a normal vitamin D level at 64. He is continued to take vitamin D3 2000 units daily. He does have a history of hypertension and this has been fairly well controlled on Avapro 5 mg as well as metoprolol succinate 25 mg daily which he has been taking a 12.5 twice a day regimen. Evaluate Dr. Alwyn Ren his blood pressure was stable. Another time when his blood pressure checked at the  [employer] was slightly elevated at approximately 140/88. He denies shortness of breath. He denies change in exercise. There is remote history of prostate CA which developed at age 61 at which time he underwent surgery.   Past Medical History  Diagnosis Date  . Other and unspecified hyperlipidemia     Dr Daphene Jaeger  . HTN (hypertension)   . MVP (mitral valve prolapse) 2007    mitral valve repair in [place]  . ASD (atrial septal defect) 2007    repaired with PFO in [place]  . Echocardiogram abnormal 2013    EF 50-55%, normal diastolic parameters, mild mitral regur. with his post leaflet of MV calcified  . History of stress test 2008    neg.for ischemia    Past Surgical History  Procedure Laterality Date  . Mitral valve repair  2007    Robotic surgery @ [address] , [place] and ASD repair and PFO repair  . Hernia repair  2007  . Prostate surgery  1995    TURP , Dr Aldean Ast  . Colonoscopy  2005 ; 2010;2014    polyp 2010 , Dr Kinnie Scales  . I&d extremity      thumb, for infection  . Cardiac catheterization  2007    EF 65-70%, significant mitral prolapse wth 4+ MR, normal coronary arteries     No Known Allergies  Current Outpatient Prescriptions  Medication Sig Dispense Refill  . aspirin 81 MG tablet Take 81 mg by mouth daily.        Marland Kitchen  Cholecalciferol (VITAMIN D3) 2000 UNITS TABS Take by mouth daily.      . fish oil-omega-3 fatty acids 1000 MG capsule Take by mouth daily.      . metoprolol succinate (TOPROL-XL) 25 MG 24 hr tablet Take 25 mg by mouth. 1/2 by twice daily      . ramipril (ALTACE) 5 MG capsule Take 5 mg by mouth daily.        . rosuvastatin (CRESTOR) 20 MG tablet Take 20 mg by mouth daily.       No current facility-administered medications for this visit.    History   Social History  . Marital Status: Married    Spouse Name: N/A    Number of Children: N/A  . Years of Education: N/A   Occupational History  . Not on file.   Social History Main Topics    . Smoking status: Former Smoker    Quit date: 06/10/1977  . Smokeless tobacco: Never Used     Comment: from age [age], up to 1 ppd or less  . Alcohol Use: 8.4 oz/week    14 Glasses of wine per week  . Drug Use: No  . Sexual Activity: Not on file   Other Topics Concern  . Not on file   Social History Narrative   Gets reg exercise    Family History  Problem Relation Age of Onset  . Hypertension Father   . Cancer Father     throat ; smoker  . Prostate cancer Father   . Stroke Paternal Grandmother     late 11s  . Heart disease Neg Hx   . Diabetes Neg Hx   . AAA (abdominal aortic aneurysm) Maternal Grandmother    Social history is notable that he is married he has 2 children. He does exercise. Is no tobacco or alcohol use.  ROS is negative for fevers, chills or night sweats. He denies skin changes. He denies visual symptoms. He denies PND orthopnea. He is unaware of palpitations. He denies chest pressure. He denies cough or sputum production wheezing. He denies change in bowel or bladder habits. He denies any GU or GI symptoms. He denies myalgias. There is no edema. Is no history of diabetes. No history of other endocrine problems. Other comprehensive 12 point system review is negative.  PE BP 138/88  Pulse 71  Ht 6' (1.829 m)  Wt 214 lb 12.8 oz (97.433 kg)  BMI 29.13 kg/m2  Repeat blood pressure taken by me was 122/84 General: Alert, oriented, no distress.  Skin: normal turgor, no rashes HEENT: Normocephalic, atraumatic. Pupils round and reactive; sclera anicteric;no lid lag.  Nose without nasal septal hypertrophy Mouth/Parynx benign; Mallinpatti scale 2 Neck: No JVD, no carotid briuts Lungs: clear to ausculatation and percussion; no wheezing or rales Heart: RRR, s1 s2 normal several systolic clicks. I did not hear any murmur of mitral regurgitation today. Abdomen: soft, nontender; no hepatosplenomehaly, BS+; abdominal aorta nontender and not dilated by palpation. Pulses  2+ Extremities: no clubbing cyanosis or edema, Homan's sign negative  Neurologic: grossly nonfocal Psychologic: normal affect and mood.  ECG: Sinus at 71 beats per minute. No significant change the  LABS:  BMET    Component Value Date/Time   NA 137 04/14/2013 1046   K 4.5 04/14/2013 1046   CL 102 04/14/2013 1046   CO2 27 04/14/2013 1046   GLUCOSE 89 04/14/2013 1046   BUN 17 04/14/2013 1046   CREATININE 1.0 04/14/2013 1046   CALCIUM 9.5 04/14/2013 1046  Hepatic Function Panel     Component Value Date/Time   PROT 7.0 04/14/2013 1046   ALBUMIN 4.3 04/14/2013 1046   AST 27 04/14/2013 1046   ALT 21 04/14/2013 1046   ALKPHOS 44 04/14/2013 1046   BILITOT 0.4 04/14/2013 1046   BILIDIR 0.1 03/31/2012 0907     CBC    Component Value Date/Time   WBC 8.1 04/14/2013 1046   RBC 4.78 04/14/2013 1046   HGB 13.9 04/14/2013 1046   HCT 42.7 04/14/2013 1046   PLT 230.0 04/14/2013 1046   MCV 89.2 04/14/2013 1046   MCHC 32.7 04/14/2013 1046   RDW 14.5 04/14/2013 1046   LYMPHSABS 1.4 04/14/2013 1046   MONOABS 0.6 04/14/2013 1046   EOSABS 0.1 04/14/2013 1046   BASOSABS 0.0 04/14/2013 1046     BNP No results found for this basename: probnp    Lipid Panel     Component Value Date/Time   CHOL 179 04/14/2013 1046   TRIG 122.0 04/14/2013 1046   HDL 62.30 04/14/2013 1046   CHOLHDL 3 04/14/2013 1046   VLDL 24.4 04/14/2013 1046   LDLCALC 92 04/14/2013 1046     RADIOLOGY: No results found.    ASSESSMENT AND PLAN: Cardiovascular standpoint, [name] is doing well now 7 years status post da Vinci robot mitral valve repair surgery with preoperative severe MR due to a ruptured chordae which time he had placement of 4 new Gore-Tex chordae, 2 from the medial, 2 from the lateral muscle which were brought to the edges of the posterior leaflet. He also had repair of a small atrial septal defect as well as a patent foramen ovale. Preoperatively he had normal coronary arteries. He is without anginal  symptomatology. His most recent echo Doppler study continues to show excellent repair. I did review the laboratory that was recently done done by Dr. Alwyn Ren. He is tolerating his dose of Crestor 20 mg and fish oil. His blood pressure is stable. Resting pulse is in the 70s. I did suggest that he does note more blood pressure lability in his blood pressure tends to consistently run over 140 systolically he can increase his Toprol to 37.5 mg rather than 25 mg daily. I will see him in one year for followup cardiologic evaluation or sooner if problems arise.     Lennette Bihari, MD, [facility]  04/28/2013 9:53 AM

## 2013-11-01 ENCOUNTER — Other Ambulatory Visit: Payer: Self-pay | Admitting: Cardiovascular Disease

## 2013-11-02 NOTE — Telephone Encounter (Signed)
Rx was sent to pharmacy electronically. 

## 2013-11-15 ENCOUNTER — Encounter: Payer: Self-pay | Admitting: Internal Medicine

## 2013-11-15 ENCOUNTER — Ambulatory Visit (INDEPENDENT_AMBULATORY_CARE_PROVIDER_SITE_OTHER): Payer: BC Managed Care – PPO | Admitting: Internal Medicine

## 2013-11-15 VITALS — BP 128/84 | HR 67 | Temp 98.1°F | Wt 215.6 lb

## 2013-11-15 DIAGNOSIS — L255 Unspecified contact dermatitis due to plants, except food: Secondary | ICD-10-CM

## 2013-11-15 DIAGNOSIS — L237 Allergic contact dermatitis due to plants, except food: Secondary | ICD-10-CM

## 2013-11-15 MED ORDER — PREDNISONE 20 MG PO TABS: 20.0000 mg | ORAL_TABLET | Freq: Two times a day (BID) | ORAL | Status: DC

## 2013-11-15 MED ORDER — MOMETASONE FUROATE 0.1 % EX OINT: TOPICAL_OINTMENT | Freq: Two times a day (BID) | CUTANEOUS | Status: DC

## 2013-11-15 MED ORDER — HYDROXYZINE HCL 10 MG PO TABS: 10.0000 mg | ORAL_TABLET | Freq: Four times a day (QID) | ORAL | Status: DC | PRN

## 2013-11-15 NOTE — Progress Notes (Signed)
   Subjective:    Patient ID: William Lewis, male    DOB: Sep 19, 1950, 63 y.o.   MRN: [id number]  HPI  He was exposed to poison ivy 11/10/13 in his backyard. This has progressed as papules which involve the extremities, thorax, neck, and face.  These are pruritic raised papules worse in the left antecubital area.  He believes that he's been exposed repeatedly by his dogs which play in the yard.  He used Community education officer and cortisone 10 with some benefit   Review of Systems   He specifically denies itchy, watery eyes, sneezing  He's had no swelling of his lips or tongue or other signs of angioedema  He has no wheezing, dyspnea,       Objective:   Physical Exam  General appearance:good health ;well nourished; no acute distress or increased work of breathing is present.  No  lymphadenopathy about the head, neck, or axilla noted.   Eyes: No conjunctival inflammation or lid edema is present. There is no scleral icterus.  Ears:  External ear exam shows no significant lesions or deformities.   Nose:  External nasal examination shows no deformity or inflammation. Nasal mucosa are pink and moist without lesions or exudates. No septal dislocation or deviation.No obstruction to airflow.   Oral exam: Dental hygiene is good; lips and gums are healthy appearing.There is no oropharyngeal erythema or exudate noted.   Neck:  No deformities, masses, or tenderness noted.     Heart:  Normal rate and regular rhythm. S1 and S2 normal without gallop, murmur, click, rub or other extra sounds.   Lungs:Chest clear to auscultation; no wheezes, rhonchi,rales ,or rubs present.No increased work of breathing.    Extremities:  No cyanosis, edema, or clubbing  noted    Skin: There are  slightly macerated papular lesions in left antecubital area which are erythematous. He has lesions over the anterior chest as well as right extremities well.         Assessment & Plan:  #1 severe , diffuse poison ivy w/o  associated respirstory complications See orders

## 2013-11-15 NOTE — Progress Notes (Signed)
Pre visit review using our clinic review tool, if applicable. No additional management support is needed unless otherwise documented below in the visit note. 

## 2013-11-15 NOTE — Patient Instructions (Signed)
   If you could possibly have been exposed to poison ivy; immediately shower & use soap liberally to remove oil 

## 2014-03-15 ENCOUNTER — Encounter: Payer: Self-pay | Admitting: Internal Medicine

## 2014-03-18 ENCOUNTER — Telehealth: Payer: Self-pay | Admitting: Cardiovascular Disease

## 2014-03-18 DIAGNOSIS — I10 Essential (primary) hypertension: Secondary | ICD-10-CM

## 2014-03-18 DIAGNOSIS — Z8546 Personal history of malignant neoplasm of prostate: Secondary | ICD-10-CM

## 2014-03-18 DIAGNOSIS — R5383 Other fatigue: Secondary | ICD-10-CM

## 2014-03-18 DIAGNOSIS — E559 Vitamin D deficiency, unspecified: Secondary | ICD-10-CM

## 2014-03-18 DIAGNOSIS — E785 Hyperlipidemia, unspecified: Secondary | ICD-10-CM

## 2014-03-18 DIAGNOSIS — Z79899 Other long term (current) drug therapy: Secondary | ICD-10-CM

## 2014-03-18 NOTE — Telephone Encounter (Signed)
Need a lab order sent to him before his appointment on 05/02/14 .Marland Kitchen   Thanks

## 2014-03-18 NOTE — Telephone Encounter (Signed)
LAB SLIP MAILED

## 2014-04-25 LAB — CBC WITH DIFFERENTIAL/PLATELET
Basophils Absolute: 0.1 10*3/uL (ref 0.0–0.1)
Basophils Relative: 1 % (ref 0–1)
EOS PCT: 2 % (ref 0–5)
Eosinophils Absolute: 0.1 10*3/uL (ref 0.0–0.7)
HCT: 42.1 % (ref 39.0–52.0)
Hemoglobin: 14.3 g/dL (ref 13.0–17.0)
Lymphocytes Relative: 24 % (ref 12–46)
Lymphs Abs: 1.2 10*3/uL (ref 0.7–4.0)
MCH: 29.1 pg (ref 26.0–34.0)
MCHC: 34 g/dL (ref 30.0–36.0)
MCV: 85.6 fL (ref 78.0–100.0)
MONOS PCT: 9 % (ref 3–12)
Monocytes Absolute: 0.5 10*3/uL (ref 0.1–1.0)
Neutro Abs: 3.3 10*3/uL (ref 1.7–7.7)
Neutrophils Relative %: 64 % (ref 43–77)
Platelets: 318 10*3/uL (ref 150–400)
RBC: 4.92 MIL/uL (ref 4.22–5.81)
RDW: 13.5 % (ref 11.5–15.5)
WBC: 5.1 10*3/uL (ref 4.0–10.5)

## 2014-04-25 LAB — COMPREHENSIVE METABOLIC PANEL
ALBUMIN: 4.2 g/dL (ref 3.5–5.2)
ALT: 16 U/L (ref 0–53)
AST: 15 U/L (ref 0–37)
Alkaline Phosphatase: 50 U/L (ref 39–117)
BUN: 19 mg/dL (ref 6–23)
CALCIUM: 9.4 mg/dL (ref 8.4–10.5)
CHLORIDE: 103 meq/L (ref 96–112)
CO2: 23 mEq/L (ref 19–32)
Creat: 0.95 mg/dL (ref 0.50–1.35)
GLUCOSE: 90 mg/dL (ref 70–99)
POTASSIUM: 4.7 meq/L (ref 3.5–5.3)
SODIUM: 137 meq/L (ref 135–145)
TOTAL PROTEIN: 6.8 g/dL (ref 6.0–8.3)
Total Bilirubin: 0.4 mg/dL (ref 0.2–1.2)

## 2014-04-25 LAB — LIPID PANEL
CHOL/HDL RATIO: 2.9 ratio
Cholesterol: 124 mg/dL (ref 0–200)
HDL: 43 mg/dL (ref >39)
LDL Cholesterol: 65 mg/dL (ref 0–99)
Triglycerides: 78 mg/dL (ref <150)
VLDL: 16 mg/dL (ref 0–40)

## 2014-04-25 LAB — TSH: TSH: 2.255 u[IU]/mL (ref 0.350–4.500)

## 2014-04-26 LAB — PSA: PSA: <0.01 ng/mL (ref <=4.00)

## 2014-05-02 ENCOUNTER — Encounter: Payer: Self-pay | Admitting: Cardiovascular Disease

## 2014-05-02 ENCOUNTER — Ambulatory Visit (INDEPENDENT_AMBULATORY_CARE_PROVIDER_SITE_OTHER): Payer: BC Managed Care – PPO | Admitting: Cardiovascular Disease

## 2014-05-02 VITALS — BP 130/80 | HR 57 | Ht 72.0 in | Wt 214.9 lb

## 2014-05-02 DIAGNOSIS — Z9889 Other specified postprocedural states: Secondary | ICD-10-CM

## 2014-05-02 DIAGNOSIS — I080 Rheumatic disorders of both mitral and aortic valves: Secondary | ICD-10-CM

## 2014-05-02 DIAGNOSIS — I10 Essential (primary) hypertension: Secondary | ICD-10-CM

## 2014-05-02 DIAGNOSIS — E785 Hyperlipidemia, unspecified: Secondary | ICD-10-CM

## 2014-05-02 NOTE — Progress Notes (Signed)
Patient ID: William Lewis, male   DOB: [birth year], 63 y.o.   MRN: [id number]     HPI: William Lewis is a 63 y.o. male who presents to the office for a one year cardiology followup evaluation.  [name] underwent mitral valve repair robotically by Dr. Elveria Rising [facility] a 04/10/2006 for significant mitral valve prolapse with partial flail posterior P2 leaflet. He had 4+ preoperative mitral regurgitation and also had an ASD as well as a patent foramen ovale which were also successfully repaired. At the time of his surgery, his left atrial appendage was closed.  Preoperatively, he was documented to have normal coronary arteries by cardiac catheterization. Last year he underwent a 18 month followup echo Doppler study which continued to show excellent result. Left ventricular ejection fraction was 60-65% and he did not have regional wall motion abnormalities. His mitral annular ring prosthesis was present. There is no mention in his current echo of any residual mitral regurgitation whereas on his prior echo in 2013 he was felt to have mild MR. All other valvular anatomy was normal.  He has a remote history of prostate CA which developed at age 5 at which time he underwent surgery.  Over the past year, William Lewis has continued to be active.  He plays tennis regularly.  He recently went on a bike trip to [place] and was biking up to 40 miles per day.  He tolerated this well.  Unfortunate, his mother died in 12-09-2022.  Suddenly secondary to apparent metastatic CA to her spine.  He is without chest pain.  He denies any arrhythmia.  He states his blood pressure has been well controlled.  He recently underwent a one-year follow-up blood work on 04/25/2014.  His cholesterol is 124, triglycerides 78, HDL 43, LDL 65.  TSH was normal at 2.25.  PSA remained less than 0.01.  His hemoglobin and hematocrit were stable at 14.3 and 42.1.  Renal function as well as LFTs were normal.   Past Medical History    Diagnosis Date  . Other and unspecified hyperlipidemia     Dr Ellouise Newer  . HTN (hypertension)   . MVP (mitral valve prolapse) 2007    mitral valve repair in [place]  . ASD (atrial septal defect) 2007    repaired with PFO in [place]  . Echocardiogram abnormal 2013    EF 41-74%, normal diastolic parameters, mild mitral regur. with his post leaflet of MV calcified  . History of stress test 2008    neg.for ischemia    Past Surgical History  Procedure Laterality Date  . Mitral valve repair  2007    Robotic surgery @ [address] , [place] and ASD repair and PFO repair  . Hernia repair  2007  . Prostate surgery  1995    TURP , Dr Serita Butcher  . Colonoscopy  2005 ; 2010;2014    polyp 2010 , Dr Earlean Shawl  . I&d extremity      thumb, for infection  . Cardiac catheterization  2007    EF 65-70%, significant mitral prolapse wth 4+ MR, normal coronary arteries     No Known Allergies  Current Outpatient Prescriptions  Medication Sig Dispense Refill  . aspirin 81 MG tablet Take 81 mg by mouth daily.      . Cholecalciferol (VITAMIN D3) 2000 UNITS TABS Take by mouth daily.    . CRESTOR 20 MG tablet TAKE 1 BY MOUTH AT BEDTIME 90 tablet 1  . fish oil-omega-3 fatty acids  1000 MG capsule Take by mouth daily.    . metoprolol succinate (TOPROL-XL) 25 MG 24 hr tablet TAKE 1/2 BY MOUTH TWICE DAILY 90 tablet 1  . ramipril (ALTACE) 5 MG capsule TAKE 1 BY MOUTH DAILY 90 capsule 1   No current facility-administered medications for this visit.    History   Social History  . Marital Status: Married    Spouse Name: N/A    Number of Children: N/A  . Years of Education: N/A   Occupational History  . Not on file.   Social History Main Topics  . Smoking status: Former Smoker    Quit date: 06/10/1977  . Smokeless tobacco: Never Used     Comment: from age [age], up to 1 ppd or less  . Alcohol Use: 8.4 oz/week    14 Glasses of wine per week  . Drug Use: No  . Sexual Activity: Not on file   Other  Topics Concern  . Not on file   Social History Narrative   Gets reg exercise    Family History  Problem Relation Age of Onset  . Hypertension Father   . Cancer Father     throat ; smoker  . Prostate cancer Father   . Stroke Paternal Grandmother     late 66s  . Heart disease Neg Hx   . Diabetes Neg Hx   . AAA (abdominal aortic aneurysm) Maternal Grandmother    Social history is notable that he is married he has 2 children. He does exercise. Is no tobacco or alcohol use.   \ROS General: Negative; No fevers, chills, or night sweats;  HEENT: Negative; No changes in vision or hearing, sinus congestion, difficulty swallowing Pulmonary: Negative; No cough, wheezing, shortness of breath, hemoptysis Cardiovascular: Negative; No chest pain, presyncope, syncope, palpitations GI: Negative; No nausea, vomiting, diarrhea, or abdominal pain GU: Negative; No dysuria, hematuria, or difficulty voiding Musculoskeletal: Negative; no myalgias, joint pain, or weakness Hematologic/Oncology: Negative; no easy bruising, bleeding Endocrine: Negative; no heat/cold intolerance; no diabetes Neuro: Negative; no changes in balance, headaches Skin: Negative; No rashes or skin lesions Psychiatric: Negative; No behavioral problems, depression Sleep: Negative; No snoring, daytime sleepiness, hypersomnolence, bruxism, restless legs, hypnogognic hallucinations, no cataplexy Other comprehensive 14 point system review is negative.  PE BP 130/80 mmHg  Pulse 57  Ht 6' (1.829 m)  Wt 214 lb 14.4 oz (97.478 kg)  BMI 29.14 kg/m2  Repeat blood pressure taken by me was 122/84 General: Alert, oriented, no distress.  Skin: normal turgor, no rashes HEENT: Normocephalic, atraumatic. Pupils round and reactive; sclera anicteric;no lid lag.  Nose without nasal septal hypertrophy Mouth/Parynx benign; Mallinpatti scale 2 Neck: No JVD, no carotid bruits with normal carotid upstroke Chest wall: Nontender to  palpation Lungs: clear to ausculatation and percussion; no wheezing or rales Heart: RRR, s1 s2 normal several systolic clicks. I did not hear any murmur of mitral regurgitation today. Abdomen: soft, nontender; no hepatosplenomehaly, BS+; abdominal aorta nontender and not dilated by palpation. Back: No CVA tenderness Pulses 2+ Extremities: no clubbing cyanosis or edema, Homan's sign negative  Neurologic: grossly nonfocal Psychologic: normal affect and mood.  ECG (independently read by me): NSR at 57; PR interval 220 ms.  QTc interval normal  Prior November 2014 ECG: Sinus at 71 beats per minute. No significant change the  LABS:  BMET    Component Value Date/Time   NA 137 04/14/2013 1046   K 4.5 04/14/2013 1046   CL 102 04/14/2013 1046  CO2 27 04/14/2013 1046   GLUCOSE 89 04/14/2013 1046   BUN 17 04/14/2013 1046   CREATININE 1.0 04/14/2013 1046   CALCIUM 9.5 04/14/2013 1046     Hepatic Function Panel     Component Value Date/Time   PROT 7.0 04/14/2013 1046   ALBUMIN 4.3 04/14/2013 1046   AST 27 04/14/2013 1046   ALT 21 04/14/2013 1046   ALKPHOS 44 04/14/2013 1046   BILITOT 0.4 04/14/2013 1046   BILIDIR 0.1 03/31/2012 0907     CBC    Component Value Date/Time   WBC 8.1 04/14/2013 1046   RBC 4.78 04/14/2013 1046   HGB 13.9 04/14/2013 1046   HCT 42.7 04/14/2013 1046   PLT 230.0 04/14/2013 1046   MCV 89.2 04/14/2013 1046   MCHC 32.7 04/14/2013 1046   RDW 14.5 04/14/2013 1046   LYMPHSABS 1.4 04/14/2013 1046   MONOABS 0.6 04/14/2013 1046   EOSABS 0.1 04/14/2013 1046   BASOSABS 0.0 04/14/2013 1046     BNP No results found for: PROBNP  Lipid Panel     Component Value Date/Time   CHOL 179 04/14/2013 1046   TRIG 122.0 04/14/2013 1046   HDL 62.30 04/14/2013 1046   CHOLHDL 3 04/14/2013 1046   VLDL 24.4 04/14/2013 1046   LDLCALC 92 04/14/2013 1046     RADIOLOGY: No results found.    ASSESSMENT AND PLAN:   William Lewis is doing well now 8 years status  post da Vinci robot mitral valve repair surgery with preoperative severe MR due to a ruptured chordae which time he had placement of 4 new Gore-Tex chordae, 2 from the medial, 2 from the lateral muscle which were brought to the edges of the posterior leaflet. He also had repair of a small atrial septal defect as well as a patent foramen ovale. Preoperatively he had normal coronary arteries. He is without anginal symptomatology. His echo Doppler study from one year ago  shows excellent repair. I did review the laboratory that he had done from last week.  I reviewed this with him in detail.  He remains active.  His blood pressure today is controlled on Toprol-XL 25 mg daily for which she's taken in the 12.5 twice a day regimen, and REM of April 5 milligrams.  He is on Crestor 20 mg and tolerating this well.  His weight is stable at 214.  He continues to exercise regularly.  He'll continue current therapy.  I will see him in one year for reevaluation, and prior to that office visit he will undergo a 2 year follow-up echo Doppler study.    Troy Sine, MD, [facility]  05/02/2014 3:39 PM

## 2014-05-02 NOTE — Patient Instructions (Signed)
Your physician has requested that you have an echocardiogram and office visit in 1 year.  Echocardiography is a painless test that uses sound waves to create images of your heart. It provides your doctor with information about the size and shape of your heart and how well your heart's chambers and valves are working. This procedure takes approximately one hour. There are no restrictions for this procedure.

## 2014-05-04 LAB — VITAMIN D 1,25 DIHYDROXY
Vitamin D 1, 25 (OH)2 Total: 45 pg/mL (ref 18–72)
Vitamin D2 1, 25 (OH)2: <8 pg/mL
Vitamin D3 1, 25 (OH)2: 45 pg/mL

## 2014-05-06 ENCOUNTER — Encounter: Payer: Self-pay | Admitting: Cardiovascular Disease

## 2014-05-09 ENCOUNTER — Encounter: Payer: Self-pay | Admitting: Internal Medicine

## 2014-05-16 ENCOUNTER — Ambulatory Visit (INDEPENDENT_AMBULATORY_CARE_PROVIDER_SITE_OTHER): Payer: BC Managed Care – PPO | Admitting: Internal Medicine

## 2014-05-16 ENCOUNTER — Encounter: Payer: Self-pay | Admitting: Internal Medicine

## 2014-05-16 VITALS — BP 108/70 | HR 63 | Temp 98.2°F | Resp 14 | Ht 72.0 in | Wt 215.0 lb

## 2014-05-16 DIAGNOSIS — Z0189 Encounter for other specified special examinations: Secondary | ICD-10-CM

## 2014-05-16 DIAGNOSIS — E785 Hyperlipidemia, unspecified: Secondary | ICD-10-CM

## 2014-05-16 DIAGNOSIS — Z0000 Encounter for general adult medical examination without abnormal findings: Secondary | ICD-10-CM

## 2014-05-16 DIAGNOSIS — Z23 Encounter for immunization: Secondary | ICD-10-CM

## 2014-05-16 NOTE — Patient Instructions (Signed)
Prevnar 13 recommended at this time with the pneumococcal 23 Valent immunization in 1 year.

## 2014-05-16 NOTE — Progress Notes (Signed)
   Subjective:    Patient ID: William Lewis, male    DOB: 11/15/1950, 63 y.o.   MRN: [id number]  HPI He is here for a physical;acute issues denied.  He is compliant with his statin and antihypertensive medications without adverse effects.  He's on a heart healthy diet. He exercises 3-4 times a week without cardiopulmonary symptoms.  There is no family history of premature coronary disease or heart attack. There is family history of intra-abdominal cancer. His colonoscopy is up-to-date.        Review of Systems  Significant headaches, epistaxis, chest pain, palpitations, exertional dyspnea, claudication, paroxysmal nocturnal dyspnea, or edema absent. No GI symptoms , memory loss or myalgias       Objective:   Physical Exam Gen.: Healthy and well-nourished in appearance. Alert, appropriate and cooperative throughout exam. Appears younger than stated age  Head: Normocephalic without obvious abnormalities; no alopecia  Eyes: No corneal or conjunctival inflammation noted. Pupils equal round reactive to light and accommodation. Extraocular motion intact.  Ears: External  ear exam reveals no significant lesions or deformities. Canals clear .TMs normal. Hearing is grossly normal bilaterally. Nose: External nasal exam reveals no deformity or inflammation. Nasal mucosa are pink and moist. No lesions or exudates noted.   Mouth: Oral mucosa and oropharynx reveal no lesions or exudates. Teeth in good repair. Neck: No deformities, masses, or tenderness noted. Range of motion & Thyroid normal. Lungs: Normal respiratory effort; chest expands symmetrically. Lungs are clear to auscultation without rales, wheezes, or increased work of breathing. Heart: Normal rate and rhythm. Normal S1 and S2. No gallop, click, or rub. No murmur. Repeat BP 120/82 Abdomen: Bowel sounds normal; abdomen soft and nontender. No masses, organomegaly or hernias noted. Genitalia: as per Urology                             Musculoskeletal/extremities: No deformity or scoliosis noted of  the thoracic or lumbar spine.  No clubbing, cyanosis, edema, or significant extremity  deformity noted.  Range of motion normal . Tone & strength normal. Hand joints normal   Fingernail / toenail health good. Minor crepitus of knees  Able to lie down & sit up w/o help.  Negative SLR bilaterally Vascular: Carotid, radial artery, dorsalis pedis and  posterior tibial pulses are full and equal. No bruits present. Neurologic: Alert and oriented x3. Deep tendon reflexes symmetrical and normal.  Gait normal .      Skin: Intact without suspicious lesions or rashes. Lymph: No cervical, axillary lymphadenopathy present. Psych: Mood and affect are normal. Normally interactive                                                                                        Assessment & Plan:  #1 comprehensive physical exam; no acute findings  Plan: see Orders  & Recommendations

## 2014-05-16 NOTE — Progress Notes (Signed)
Pre visit review using our clinic review tool, if applicable. No additional management support is needed unless otherwise documented below in the visit note. 

## 2014-06-14 ENCOUNTER — Other Ambulatory Visit: Payer: Self-pay | Admitting: *Deleted

## 2014-06-14 MED ORDER — METOPROLOL SUCCINATE ER 25 MG PO TB24: ORAL_TABLET | ORAL | Status: DC

## 2014-06-14 MED ORDER — RAMIPRIL 5 MG PO CAPS: ORAL_CAPSULE | ORAL | Status: DC

## 2014-06-14 MED ORDER — ROSUVASTATIN CALCIUM 20 MG PO TABS: ORAL_TABLET | ORAL | Status: DC

## 2015-01-31 ENCOUNTER — Encounter: Payer: Self-pay | Admitting: Cardiovascular Disease

## 2015-01-31 MED ORDER — ROSUVASTATIN CALCIUM 20 MG PO TABS: ORAL_TABLET | ORAL | Status: DC

## 2015-02-20 ENCOUNTER — Encounter: Payer: Self-pay | Admitting: Internal Medicine

## 2015-04-11 ENCOUNTER — Telehealth: Payer: Self-pay | Admitting: *Deleted

## 2015-04-11 NOTE — Telephone Encounter (Signed)
Patient signed ROI received via fax from [facility]. Forwarded to [place] to scan/email to medical records. JG//CMA

## 2015-05-01 ENCOUNTER — Encounter: Payer: Self-pay | Admitting: Cardiovascular Disease

## 2015-05-03 ENCOUNTER — Encounter: Payer: Self-pay | Admitting: Cardiovascular Disease

## 2015-05-03 ENCOUNTER — Ambulatory Visit (INDEPENDENT_AMBULATORY_CARE_PROVIDER_SITE_OTHER): Payer: BLUE CROSS/BLUE SHIELD | Admitting: Cardiovascular Disease

## 2015-05-03 VITALS — BP 144/98 | HR 64 | Ht 72.0 in | Wt 218.3 lb

## 2015-05-03 DIAGNOSIS — E785 Hyperlipidemia, unspecified: Secondary | ICD-10-CM

## 2015-05-03 DIAGNOSIS — E559 Vitamin D deficiency, unspecified: Secondary | ICD-10-CM

## 2015-05-03 DIAGNOSIS — Z9889 Other specified postprocedural states: Secondary | ICD-10-CM

## 2015-05-03 DIAGNOSIS — R5383 Other fatigue: Secondary | ICD-10-CM | POA: Diagnosis not present

## 2015-05-03 DIAGNOSIS — C61 Malignant neoplasm of prostate: Secondary | ICD-10-CM

## 2015-05-03 DIAGNOSIS — Z79899 Other long term (current) drug therapy: Secondary | ICD-10-CM | POA: Diagnosis not present

## 2015-05-03 DIAGNOSIS — Z125 Encounter for screening for malignant neoplasm of prostate: Secondary | ICD-10-CM

## 2015-05-03 DIAGNOSIS — I10 Essential (primary) hypertension: Secondary | ICD-10-CM

## 2015-05-03 LAB — LIPID PANEL
CHOL/HDL RATIO: 2.6 ratio (ref <=5.0)
Cholesterol: 156 mg/dL (ref 125–200)
HDL: 60 mg/dL (ref >=40)
LDL Cholesterol: 82 mg/dL (ref <130)
Triglycerides: 72 mg/dL (ref <150)
VLDL: 14 mg/dL (ref <30)

## 2015-05-03 LAB — COMPLETE METABOLIC PANEL WITH GFR
ALBUMIN: 4.2 g/dL (ref 3.6–5.1)
ALK PHOS: 52 U/L (ref 40–115)
ALT: 16 U/L (ref 9–46)
AST: 17 U/L (ref 10–35)
BILIRUBIN TOTAL: 0.5 mg/dL (ref 0.2–1.2)
BUN: 18 mg/dL (ref 7–25)
CALCIUM: 9.2 mg/dL (ref 8.6–10.3)
CO2: 27 mmol/L (ref 20–31)
Chloride: 104 mmol/L (ref 98–110)
Creat: 0.92 mg/dL (ref 0.70–1.25)
GFR, EST NON AFRICAN AMERICAN: 86 mL/min (ref >=60)
GLUCOSE: 94 mg/dL (ref 65–99)
POTASSIUM: 5 mmol/L (ref 3.5–5.3)
Sodium: 140 mmol/L (ref 135–146)
Total Protein: 6.7 g/dL (ref 6.1–8.1)

## 2015-05-03 LAB — TSH: TSH: 2.683 u[IU]/mL (ref 0.350–4.500)

## 2015-05-03 NOTE — Progress Notes (Signed)
Patient ID: William Lewis, male   DOB: 11-11-1950, 64 y.o.   MRN: [id number]     HPI: William Lewis is a 64 y.o. male who presents to the office for a one year cardiology followup evaluation.  [name] underwent mitral valve repair robotically by Dr. Elveria Rising [facility] a 04/10/2006 for significant mitral valve prolapse with partial flail posterior P2 leaflet. He had 4+ preoperative mitral regurgitation and also had an ASD as well as a patent foramen ovale which were also successfully repaired. At the time of his surgery, his left atrial appendage was closed.  Preoperatively, he was documented to have normal coronary arteries by cardiac catheterization. Last year he underwent a 18 month followup echo Doppler study which continued to show excellent result. Left ventricular ejection fraction was 60-65% and he did not have regional wall motion abnormalities. His mitral annular ring prosthesis was present. There is no mention in his current echo of any residual mitral regurgitation whereas on his prior echo in 2013 he was felt to have mild MR. All other valvular anatomy was normal.  He has a remote history of prostate CA which developed at age 28 at which time he underwent surgery.  Over the past year, [name] has continued to be active and feels that he has had a "good year."  He plays tennis regularly, approximately 2-3 days per week.  He rides a bike but has not been riding as much recently with the cold weather.  Last year he  went on a bike trip to [place] and was biking up to 40 miles per day.  He tolerated this well.  Over the past 2 years, he had lost his brother and last year lost his mother which had led to some increased stress. Presently, he is without chest pain. He denies any arrhythmia.  There is no change in exercise tolerance.  He denies any dyspnea.  He states his blood pressure has been well controlled.  Laboratory one year ago on 04/25/2014: cholesterol is 124, triglycerides  78, HDL 43, LDL 65.  TSH was normal at 2.25.  PSA remained less than 0.01.  His hemoglobin and hematocrit were stable at 14.3 and 42.1.  Renal function as well as LFTs were normal.  He is fasting today.   Past Medical History  Diagnosis Date  . Other and unspecified hyperlipidemia     Dr Ellouise Newer  . HTN (hypertension)   . MVP (mitral valve prolapse) 2007    mitral valve repair in [place]  . ASD (atrial septal defect) 2007    repaired with PFO in [place]  . Echocardiogram abnormal 2013    EF 53-29%, normal diastolic parameters, mild mitral regur. with his post leaflet of MV calcified  . History of stress test 2008    neg.for ischemia    Past Surgical History  Procedure Laterality Date  . Mitral valve repair  2007    Robotic surgery @ [address] , [place] and ASD repair and PFO repair  . Hernia repair  2007  . Prostate surgery  1995    TURP , Dr Serita Butcher  . Colonoscopy  2005 ; 2010;2014    polyp 2010 , Dr Earlean Shawl  . I&d extremity      thumb, for infection  . Cardiac catheterization  2007    EF 65-70%, significant mitral prolapse wth 4+ MR, normal coronary arteries     No Known Allergies  Current Outpatient Prescriptions  Medication Sig Dispense Refill  .  aspirin 81 MG tablet Take 81 mg by mouth daily.      . Cholecalciferol (VITAMIN D3) 2000 UNITS TABS Take by mouth daily.    . fish oil-omega-3 fatty acids 1000 MG capsule Take by mouth daily.    . metoprolol succinate (TOPROL-XL) 25 MG 24 hr tablet TAKE 1/2 BY MOUTH TWICE DAILY 90 tablet 3  . ramipril (ALTACE) 5 MG capsule TAKE 1 BY MOUTH DAILY 90 capsule 3  . rosuvastatin (CRESTOR) 20 MG tablet TAKE 1 BY MOUTH AT BEDTIME 90 tablet 3   No current facility-administered medications for this visit.    Social History   Social History  . Marital Status: Married    Spouse Name: N/A  . Number of Children: N/A  . Years of Education: N/A   Occupational History  . Not on file.   Social History Main Topics  . Smoking  status: Former Smoker    Quit date: 06/10/1977  . Smokeless tobacco: Never Used     Comment: from age [age], up to 1 ppd or less  . Alcohol Use: 8.4 oz/week    14 Glasses of wine per week  . Drug Use: No  . Sexual Activity: Not on file   Other Topics Concern  . Not on file   Social History Narrative   Gets reg exercise    Family History  Problem Relation Age of Onset  . Hypertension Father   . Cancer Father     throat ; smoker  . Prostate cancer Father   . Stroke Paternal Grandmother     late 26s  . Heart disease Neg Hx   . Diabetes Neg Hx   . AAA (abdominal aortic aneurysm) Maternal Grandmother   . Cancer Mother 45    ? intra-abdominal   Social history is notable that he is married he has 2 children. He does exercise. Is no tobacco or alcohol use.  His daughter is an attorney in [place] involved with healthcare and a large firm doing exceptionally well.  His son is working in [place].   ROS General: Negative; No fevers, chills, or night sweats;  HEENT: Negative; No changes in vision or hearing, sinus congestion, difficulty swallowing Pulmonary: Negative; No cough, wheezing, shortness of breath, hemoptysis Cardiovascular: Negative; No chest pain, presyncope, syncope, palpitations GI: Negative; No nausea, vomiting, diarrhea, or abdominal pain GU: Negative; No dysuria, hematuria, or difficulty voiding Musculoskeletal: Negative; no myalgias, joint pain, or weakness Hematologic/Oncology: Negative; no easy bruising, bleeding Endocrine: Negative; no heat/cold intolerance; no diabetes Neuro: Negative; no changes in balance, headaches Skin: Negative; No rashes or skin lesions Psychiatric: Negative; No behavioral problems, depression Sleep: Negative; No snoring, daytime sleepiness, hypersomnolence, bruxism, restless legs, hypnogognic hallucinations, no cataplexy Other comprehensive 14 point system review is negative.  PE BP 144/98 mmHg  Pulse 64  Ht 6' (1.829 m)  Wt 218  lb 4.8 oz (99.02 kg)  BMI 29.60 kg/m2  Repeat blood pressure taken by me was 128/86 supine and 130/86 standing.  Wt Readings from Last 3 Encounters:  05/03/15 218 lb 4.8 oz (99.02 kg)  05/16/14 215 lb (97.523 kg)  05/02/14 214 lb 14.4 oz (97.478 kg)   General: Alert, oriented, no distress.  Skin: normal turgor, no rashes HEENT: Normocephalic, atraumatic. Pupils round and reactive; sclera anicteric;no lid lag.  Nose without nasal septal hypertrophy Mouth/Parynx benign; Mallinpatti scale 2 Neck: No JVD, no carotid bruits with normal carotid upstroke Chest wall: Nontender to palpation Lungs: clear to ausculatation and percussion; no wheezing  or rales Heart: RRR, s1 s2 normal several systolic clicks. I did not hear any murmur of mitral regurgitation. Abdomen: soft, nontender; no hepatosplenomehaly, BS+; abdominal aorta nontender and not dilated by palpation. Back: No CVA tenderness Pulses 2+ Extremities: no clubbing cyanosis or edema, Homan's sign negative  Neurologic: grossly nonfocal Psychologic: normal affect and mood.  ECG (independently read by me): Normal sinus rhythm with an isolated PAC.  Normal intervals.  No ST segment changes.  November 2015 ECG (independently read by me): NSR at 57; PR interval 220 ms.  QTc interval normal  Prior November 2014 ECG: Sinus at 71 beats per minute. No significant change the  LABS:  BMP Latest Ref Rng 04/25/2014 04/14/2013 12/23/2011  Glucose 70 - 99 mg/dL 90 89 92  BUN 6 - 23 mg/dL 19 17 19   Creatinine 0.50 - 1.35 mg/dL 0.95 1.0 1.1  Sodium 135 - 145 mEq/L 137 137 146(H)  Potassium 3.5 - 5.3 mEq/L 4.7 4.5 4.7  Chloride 96 - 112 mEq/L 103 102 107  CO2 19 - 32 mEq/L 23 27 24   Calcium 8.4 - 10.5 mg/dL 9.4 9.5 9.5   Hepatic Function Latest Ref Rng 04/25/2014 04/14/2013 03/31/2012  Total Protein 6.0 - 8.3 g/dL 6.8 7.0 6.9  Albumin 3.5 - 5.2 g/dL 4.2 4.3 4.1  AST 0 - 37 U/L 15 27 23   ALT 0 - 53 U/L 16 21 21   Alk Phosphatase 39 - 117 U/L 50  44 42  Total Bilirubin 0.2 - 1.2 mg/dL 0.4 0.4 0.6  Bilirubin, Direct 0.0 - 0.3 mg/dL - - 0.1   CBC Latest Ref Rng 04/25/2014 04/14/2013 12/23/2011  WBC 4.0 - 10.5 K/uL 5.1 8.1 6.3  Hemoglobin 13.0 - 17.0 g/dL 14.3 13.9 13.4  Hematocrit 39.0 - 52.0 % 42.1 42.7 41.3  Platelets 150 - 400 K/uL 318 230.0 212.0   Lab Results  Component Value Date   MCV 85.6 04/25/2014   MCV 89.2 04/14/2013   MCV 90.0 12/23/2011   Lab Results  Component Value Date   TSH 2.255 04/25/2014  No results found for: HGBA1C   Lab Results  Component Value Date   PSA <0.01 04/25/2014   PSA 0.00* 04/14/2013   PSA 0.00 Repeated and verified X2.* 03/31/2012   Lipid Panel     Component Value Date/Time   CHOL 124 04/25/2014 0955   TRIG 78 04/25/2014 0955   HDL 43 04/25/2014 0955   CHOLHDL 2.9 04/25/2014 0955   VLDL 16 04/25/2014 0955   LDLCALC 65 04/25/2014 0955   RADIOLOGY: No results found.    ASSESSMENT AND PLAN:  Mr. 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 He also had repair of a small atrial septal defect as well as a patent foramen ovale. Preoperatively he had normal coronary arteries. He is without anginal symptomatology.  Over the past year.  He is remained entirely asymptomatic.  He exercises regularly.  He denies any change in exercise capacity.  Specifically, there are no arrhythmias.  He denies shortness of breath.  His cardiac exam today does not reveal any residual murmur.  His blood pressure today is controlled on ramipril 5 mg and Toprol-XL 12.5 mg which he takes twice  a day.  He continues to be on aspirin 81 mg.  He is on Crestor 20 mg for hyperlipidemia.  One year ago.  His LDL was excellent at 65.  He  is fasting today.  A complete set of blood work will be obtained and he also requested that I check his PSA and vitamin D level in addition to his other routine laboratory.  He will continue current therapy as prescribed.  I will contact him regarding the results of his blood work.  In one year, I amscheduling him for three-year follow-up echo Doppler study.  I will see him back in the office for reevaluation.  Time spent: 25 minutes Troy Sine, MD, [facility]  05/03/2015 10:47 AM

## 2015-05-03 NOTE — Patient Instructions (Signed)
Your physician wants you to follow-up in: 1 Year. You will receive a reminder letter in the mail two months in advance. If you don't receive a letter, please call our office to schedule the follow-up appointment.  Your physician has requested that you have an echocardiogram in 1 Year. Echocardiography is a painless test that uses sound waves to create images of your heart. It provides your doctor with information about the size and shape of your heart and how well your heart's chambers and valves are working. This procedure takes approximately one hour. There are no restrictions for this procedure.  Your physician recommends that you return for lab work in: Today  If you need a refill on your cardiac medications before your next appointment, please call your pharmacy.      Happy Thanksgiving!!!

## 2015-05-04 LAB — PSA

## 2015-05-04 LAB — VITAMIN D 25 HYDROXY (VIT D DEFICIENCY, FRACTURES): VIT D 25 HYDROXY: 40 ng/mL (ref 30–100)

## 2015-05-07 ENCOUNTER — Encounter: Payer: Self-pay | Admitting: Cardiovascular Disease

## 2015-05-08 ENCOUNTER — Other Ambulatory Visit: Payer: Self-pay | Admitting: *Deleted

## 2015-05-08 MED ORDER — RAMIPRIL 5 MG PO CAPS: ORAL_CAPSULE | ORAL | Status: DC

## 2015-05-08 MED ORDER — METOPROLOL SUCCINATE ER 25 MG PO TB24: ORAL_TABLET | ORAL | Status: DC

## 2015-05-08 MED ORDER — ROSUVASTATIN CALCIUM 20 MG PO TABS: ORAL_TABLET | ORAL | Status: DC

## 2015-05-08 NOTE — Telephone Encounter (Signed)
Refilled medications as requested 

## 2015-05-10 ENCOUNTER — Telehealth: Payer: Self-pay | Admitting: Internal Medicine

## 2015-05-10 NOTE — Telephone Encounter (Signed)
I called pt about his CPE in Dec.  He said that he knows you personally and would like to speak with you about transferring or what to do about his cpe.  Can you call him when you get a chance.

## 2015-05-10 NOTE — Telephone Encounter (Signed)
  I am transitioning to full retirement as of 06/09/2015. Over the next several weeks; I shall be in the clinic on an abbreviated schedule . You should transfer your primary care to another primary care provider to guarantee continuity of care. Dr Billey Gosling has joined [place] here @ [facility] ; I enthusiastically recommend her to you because of her clinical skills and compassion. Also Terri Piedra, NP @ [facility] is an outstanding Primary Care practitioner who truly cares about his patients. If you need medication refills;a physical or update of your medical record ;this would be an excellent opportunity to schedule an appointment with Dr Quay Burow or Marya Amsler.  It has been an Surveyor, minerals and blessing to have served you as your physician. I wish you the best in the future. [employer]

## 2015-05-19 ENCOUNTER — Encounter: Payer: Self-pay | Admitting: Internal Medicine

## 2015-05-19 ENCOUNTER — Ambulatory Visit (INDEPENDENT_AMBULATORY_CARE_PROVIDER_SITE_OTHER): Payer: BLUE CROSS/BLUE SHIELD | Admitting: Internal Medicine

## 2015-05-19 ENCOUNTER — Other Ambulatory Visit: Payer: BLUE CROSS/BLUE SHIELD

## 2015-05-19 VITALS — BP 124/70 | HR 75 | Temp 98.1°F | Ht 72.0 in | Wt 218.0 lb

## 2015-05-19 DIAGNOSIS — Z1159 Encounter for screening for other viral diseases: Secondary | ICD-10-CM

## 2015-05-19 DIAGNOSIS — Z0000 Encounter for general adult medical examination without abnormal findings: Secondary | ICD-10-CM

## 2015-05-19 DIAGNOSIS — Z23 Encounter for immunization: Secondary | ICD-10-CM

## 2015-05-19 NOTE — Progress Notes (Signed)
   Subjective:    Patient ID: William Lewis, male    DOB: [birth year], 64 y.o.   MRN: [id number]  HPI The patient is here for a physical  to assess status of active health conditions.  PMH, FH, & Social History reviewed & updated.No change in William Lewis as recorded.   He has been compliant with his medicines without adverse effects; he is on a heart healthy diet. He does want to lose 10 pounds.  He plays tennis 3 times a week for 2 hours and walks a mile at least 7 days a week. He has no associated cardiopulmonary symptoms.   He is not checking his blood pressure at home.  He quit smoking 1981. He will have 2-3 glasses of wine nightly.  Colonoscopy was normal in 2014; repeat is due 2024. He has no active GI symptoms  Review of systems is negative except for nocturia 1.  He sees his urologist annually; PSA is monitored.  His chart was reviewed. He had extensive lab studies in November of this year. Lipids were at goal. Thyroid function test and liver function tests were normal.  Review of Systems  Chest pain, palpitations, tachycardia, exertional dyspnea, paroxysmal nocturnal dyspnea, claudication or edema are absent. No unexplained weight loss, abdominal pain, significant dyspepsia, dysphagia, melena, rectal bleeding, or persistently small caliber stools. Dysuria, pyuria, hematuria, frequency, or polyuria are denied. Change in hair, skin, nails denied. No bowel changes of constipation or diarrhea. No intolerance to heat or cold.     Objective:   Physical Exam  Pertinent or positive findings include: There is slight alopecia over the crown. He has some crepitus the knees.  General appearance :adequately nourished; in no distress.  Eyes: No conjunctival inflammation or scleral icterus is present.  Oral exam:  Lips and gums are healthy appearing.There is no oropharyngeal erythema or exudate noted. Dental hygiene is good.  Heart:  Normal rate and regular rhythm. S1 and S2 normal  without gallop, murmur, click, rub or other extra sounds    Lungs:Chest clear to auscultation; no wheezes, rhonchi,rales ,or rubs present.No increased work of breathing.   Abdomen: bowel sounds normal, soft and non-tender without masses, organomegaly or hernias noted.  No guarding or rebound.   Vascular : all pulses equal ; no bruits present.  Skin:Warm & dry.  Intact without suspicious lesions or rashes ; no tenting or jaundice   Lymphatic: No lymphadenopathy is noted about the head, neck, axilla.   Neuro: Strength, tone & DTRs normal.     Assessment & Plan:  #1 comprehensive physical exam; no acute findings  Plan: see Orders  & Recommendations

## 2015-05-19 NOTE — Progress Notes (Signed)
Pre visit review using our clinic review tool, if applicable. No additional management support is needed unless otherwise documented below in the visit note. 

## 2015-05-19 NOTE — Patient Instructions (Addendum)
  The following nutritional changes may help weight loss.  #1 White carbohydrates (potatoes, rice, bread, and pasta) cause a high spike of the sugar level which stays elevated for a significant period of time (called sugar"load").  For example a  baked potato has a cup of sugar and a  french fry  2 teaspoons of sugar.  More complex carbs such as yams, wild  rice, whole grained bread &  wheat pasta have been much lower spike and persistent load of sugar than the white carbs.   #2 Consume less than 40 Grams (preferably ZERO) of sugar per day from foods & drinks with High Fructose Corn Syrup (HFCS) sugar as #1,2,3 or # 4 on label.[employer], [employer] do not carry products with HFCS.   #3Alcohol intake is recommended as less than 1-2 drinks per day for men &  less than 1 drink per day for women.

## 2015-05-20 LAB — HEPATITIS C ANTIBODY: HCV Ab: NEGATIVE

## 2015-05-23 ENCOUNTER — Telehealth: Payer: Self-pay | Admitting: Internal Medicine

## 2015-05-23 NOTE — Telephone Encounter (Signed)
Pt left a message on mychart appt request "This was done on my visit with Dr. Linna Darner on Friday 05/19/2015. Please update my reminder. Thanks" He's talking about his Hep C screening.  He must be getting reminders to get his Hep C checked and its already done.  Are you able to update this

## 2015-05-24 NOTE — Telephone Encounter (Signed)
Reviewed chart and it is showing completed. This screening takes a few days to result sometimes. That may be the reason for the delay in it showing the same on the pt end of my chart.

## 2015-10-31 ENCOUNTER — Other Ambulatory Visit: Payer: Self-pay | Admitting: Cardiovascular Disease

## 2015-10-31 NOTE — Telephone Encounter (Signed)
Rx(s) sent to pharmacy electronically.  

## 2015-12-08 ENCOUNTER — Emergency Department (HOSPITAL_COMMUNITY): Payer: BLUE CROSS/BLUE SHIELD

## 2015-12-08 ENCOUNTER — Encounter (HOSPITAL_COMMUNITY)
Admission: EM | Disposition: A | Discharge status: Home / Self Care | Payer: Self-pay | Source: Home / Self Care | Attending: Emergency Medicine

## 2015-12-08 ENCOUNTER — Encounter (HOSPITAL_COMMUNITY): Payer: Self-pay | Admitting: Physical Medicine and Rehabilitation

## 2015-12-08 ENCOUNTER — Ambulatory Visit (HOSPITAL_COMMUNITY)
Admission: EM | Admit: 2015-12-08 | Discharge: 2015-12-08 | Disposition: A | Discharge status: Home / Self Care | Payer: BLUE CROSS/BLUE SHIELD | Attending: Emergency Medicine | Admitting: Emergency Medicine

## 2015-12-08 DIAGNOSIS — K222 Esophageal obstruction: Secondary | ICD-10-CM

## 2015-12-08 DIAGNOSIS — X58XXXA Exposure to other specified factors, initial encounter: Secondary | ICD-10-CM | POA: Diagnosis not present

## 2015-12-08 DIAGNOSIS — Z87891 Personal history of nicotine dependence: Secondary | ICD-10-CM | POA: Diagnosis not present

## 2015-12-08 DIAGNOSIS — E785 Hyperlipidemia, unspecified: Secondary | ICD-10-CM | POA: Insufficient documentation

## 2015-12-08 DIAGNOSIS — I10 Essential (primary) hypertension: Secondary | ICD-10-CM | POA: Insufficient documentation

## 2015-12-08 DIAGNOSIS — Z7982 Long term (current) use of aspirin: Secondary | ICD-10-CM | POA: Diagnosis not present

## 2015-12-08 DIAGNOSIS — T18128A Food in esophagus causing other injury, initial encounter: Secondary | ICD-10-CM | POA: Diagnosis not present

## 2015-12-08 LAB — CBC WITH DIFFERENTIAL/PLATELET
BASOS ABS: 0 10*3/uL (ref 0.0–0.1)
Basophils Relative: 0 %
Eosinophils Absolute: 0 10*3/uL (ref 0.0–0.7)
Eosinophils Relative: 0 %
HEMATOCRIT: 44.5 % (ref 39.0–52.0)
HEMOGLOBIN: 14.4 g/dL (ref 13.0–17.0)
LYMPHS PCT: 12 %
Lymphs Abs: 1.1 10*3/uL (ref 0.7–4.0)
MCH: 28.4 pg (ref 26.0–34.0)
MCHC: 32.4 g/dL (ref 30.0–36.0)
MCV: 87.8 fL (ref 78.0–100.0)
MONO ABS: 0.5 10*3/uL (ref 0.1–1.0)
Monocytes Relative: 6 %
NEUTROS ABS: 7.5 10*3/uL (ref 1.7–7.7)
NEUTROS PCT: 82 %
Platelets: 271 10*3/uL (ref 150–400)
RBC: 5.07 MIL/uL (ref 4.22–5.81)
RDW: 14.2 % (ref 11.5–15.5)
WBC: 9.2 10*3/uL (ref 4.0–10.5)

## 2015-12-08 LAB — I-STAT CHEM 8, ED
BUN: 20 mg/dL (ref 6–20)
CHLORIDE: 103 mmol/L (ref 101–111)
CREATININE: 1.1 mg/dL (ref 0.61–1.24)
Calcium, Ion: 1.08 mmol/L — ABNORMAL LOW (ref 1.12–1.23)
GLUCOSE: 96 mg/dL (ref 65–99)
HCT: 45 % (ref 39.0–52.0)
HEMOGLOBIN: 15.3 g/dL (ref 13.0–17.0)
POTASSIUM: 3.8 mmol/L (ref 3.5–5.1)
Sodium: 140 mmol/L (ref 135–145)
TCO2: 22 mmol/L (ref 0–100)

## 2015-12-08 SURGERY — CANCELLED PROCEDURE

## 2015-12-08 SURGERY — EGD (ESOPHAGOGASTRODUODENOSCOPY)
Anesthesia: Moderate Sedation

## 2015-12-08 MED ORDER — FENTANYL CITRATE (PF) 100 MCG/2ML IJ SOLN
INTRAMUSCULAR | Status: AC
  Filled 2015-12-08: qty 2

## 2015-12-08 MED ORDER — GLUCAGON HCL RDNA (DIAGNOSTIC) 1 MG IJ SOLR
1.0000 mg | Freq: Once | INTRAMUSCULAR | Status: AC
  Administered 2015-12-08: 1 mg via INTRAVENOUS
  Filled 2015-12-08: qty 1

## 2015-12-08 MED ORDER — MIDAZOLAM HCL 5 MG/ML IJ SOLN
INTRAMUSCULAR | Status: AC
  Filled 2015-12-08: qty 2

## 2015-12-08 NOTE — ED Notes (Signed)
Pt states he was eating lunch and piece of chicken became lodged in throat. Pt vomiting upon arrival to ED. Respirations unlabored.

## 2015-12-08 NOTE — ED Provider Notes (Signed)
CSN: [id number]     Arrival date & time 12/08/15  1304 History   None    Chief Complaint  Patient presents with  . Foreign Body     (Consider location/radiation/quality/duration/timing/severity/associated sxs/prior Treatment) HPI Complains of food bolus stuck in his esophagus. He was eating chicken at 1 PM today. Feels food stuck at mid esophagus. No treatment prior to coming here. He has difficulty swallowing saliva. No other associated symptoms. Nothing makes discomfort better or worse. Past Medical History  Diagnosis Date  . Other and unspecified hyperlipidemia     Dr Ellouise Newer  . HTN (hypertension)   . MVP (mitral valve prolapse) 2007    mitral valve repair in [place]  . ASD (atrial septal defect) 2007    repaired with PFO in [place]  . Echocardiogram abnormal 2013    EF [id number], normal diastolic parameters, mild mitral regur. with his post leaflet of MV calcified  . History of stress test 2008    neg.for ischemia   Past Surgical History  Procedure Laterality Date  . Mitral valve repair  2007    Robotic surgery @ [address] , [place] and ASD repair and PFO repair  . Hernia repair  2007  . Prostate surgery  1995    TURP , Dr Serita Butcher  . Colonoscopy  2005 ; 2010;2014    polyp 2010 , Dr Earlean Shawl  . I&d extremity      thumb, for infection  . Cardiac catheterization  2007    EF 65-70%, significant mitral prolapse wth 4+ MR, normal coronary arteries    Family History  Problem Relation Age of Onset  . Hypertension Father   . Cancer Father     throat ; smoker  . Prostate cancer Father   . Stroke Paternal Grandmother     late 14s  . Heart disease Neg Hx   . Diabetes Neg Hx   . AAA (abdominal aortic aneurysm) Maternal Grandmother   . Cancer Mother 79    ? intra-abdominal   Social History  Substance Use Topics  . Smoking status: Former Smoker    Quit date: 06/10/1977  . Smokeless tobacco: Never Used     Comment: from age [age], up to 1 ppd or less  . Alcohol Use:  8.4 oz/week    14 Glasses of wine per week    Review of Systems  Constitutional: Negative.   HENT: Positive for trouble swallowing.   Respiratory: Negative.   Cardiovascular: Negative.   Gastrointestinal: Negative.        Dysphagia  Musculoskeletal: Negative.   Skin: Negative.   Neurological: Negative.   Psychiatric/Behavioral: Negative.   All other systems reviewed and are negative.     Allergies  Review of patient's allergies indicates no known allergies.  Home Medications   Prior to Admission medications   Medication Sig Start Date End Date Taking? Authorizing Provider  aspirin 81 MG tablet Take 81 mg by mouth daily.      Historical Provider, MD  Cholecalciferol (VITAMIN D3) 2000 UNITS TABS Take by mouth daily.    Historical Provider, MD  fish oil-omega-3 fatty acids 1000 MG capsule Take by mouth daily.    Historical Provider, MD  metoprolol succinate (TOPROL-XL) 25 MG 24 hr tablet TAKE 1/2 BY MOUTH TWICE DAILY 10/31/15   Troy Sine, MD  ramipril (ALTACE) 5 MG capsule TAKE 1 BY MOUTH DAILY 10/31/15   Troy Sine, MD  rosuvastatin (CRESTOR) 20 MG tablet TAKE 1 BY MOUTH AT  BEDTIME 10/31/15   Troy Sine, MD   BP 150/88 mmHg  Pulse 52  Temp(Src) 98 F (36.7 C) (Oral)  Resp 18  Ht 6' (1.829 m)  Wt 212 lb (96.163 kg)  BMI 28.75 kg/m2  SpO2 98% Physical Exam  Constitutional: He appears well-developed and well-nourished. He appears distressed.  Appears mildly uncomfortable. Spitting into emesis container  HENT:  Head: Normocephalic and atraumatic.  Eyes: Conjunctivae are normal. Pupils are equal, round, and reactive to light.  Neck: Neck supple. No tracheal deviation present. No thyromegaly present.  Cardiovascular: Normal rate and regular rhythm.   No murmur heard. Pulmonary/Chest: Effort normal and breath sounds normal.  Abdominal: Soft. Bowel sounds are normal. He exhibits no distension. There is no tenderness.  Musculoskeletal: Normal range of motion. He  exhibits no edema or tenderness.  Neurological: He is alert. Coordination normal.  Skin: Skin is warm and dry. No rash noted.  Psychiatric: He has a normal mood and affect.  Nursing note and vitals reviewed.   ED Course  Procedures (including critical care time) Labs Review Labs Reviewed - No data to display  Imaging Review No results found. I have personally reviewed and evaluated these images and lab results as part of my medical decision-making.   EKG Interpretation None     Treated with IV glucagon 242 p.m. at 3:24 PM nurse came to get me the patient was in severe pain after treatment with IV glucagon. Patient reports that he doesn't feel that food both moved. He retched one time. He states pain is improving steadily since being given glucagon.  At 4:25 PM patient is asymptomatic and able to drink without difficulty. He feels that food bolus has passed. Gastroenterology recontacted. Results for orders placed or performed during the hospital encounter of 12/08/15  CBC with Differential/Platelet  Result Value Ref Range   WBC 9.2 4.0 - 10.5 K/uL   RBC 5.07 4.22 - 5.81 MIL/uL   Hemoglobin 14.4 13.0 - 17.0 g/dL   HCT 44.5 39.0 - 52.0 %   MCV 87.8 78.0 - 100.0 fL   MCH 28.4 26.0 - 34.0 pg   MCHC 32.4 30.0 - 36.0 g/dL   RDW 14.2 11.5 - 15.5 %   Platelets 271 150 - 400 K/uL   Neutrophils Relative % 82 %   Neutro Abs 7.5 1.7 - 7.7 K/uL   Lymphocytes Relative 12 %   Lymphs Abs 1.1 0.7 - 4.0 K/uL   Monocytes Relative 6 %   Monocytes Absolute 0.5 0.1 - 1.0 K/uL   Eosinophils Relative 0 %   Eosinophils Absolute 0.0 0.0 - 0.7 K/uL   Basophils Relative 0 %   Basophils Absolute 0.0 0.0 - 0.1 K/uL  I-stat chem 8, ed  Result Value Ref Range   Sodium 140 135 - 145 mmol/L   Potassium 3.8 3.5 - 5.1 mmol/L   Chloride 103 101 - 111 mmol/L   BUN 20 6 - 20 mg/dL   Creatinine, Ser 1.10 0.61 - 1.24 mg/dL   Glucose, Bld 96 65 - 99 mg/dL   Calcium, Ion 1.08 (L) 1.12 - 1.23 mmol/L   TCO2  22 0 - 100 mmol/L   Hemoglobin 15.3 13.0 - 17.0 g/dL   HCT 45.0 39.0 - 52.0 %   Dg Chest Port 1 View  12/08/2015  CLINICAL DATA:  Dysphagia EXAM: PORTABLE CHEST 1 VIEW COMPARISON:  None. FINDINGS: Lungs are clear. Heart size and pulmonary vascularity are normal. No adenopathy. No pneumomediastinum or pneumothorax evident.  No bone lesions. Patient is status post mitral valve replacement. IMPRESSION: No edema or consolidation. No apparent pneumothorax or pneumomediastinum. Status post mitral valve replacement. Electronically Signed   By: Lowella Grip III M.D.   On: 12/08/2015 16:00   Chest x-ray viewed by me MDM  I attempted to call Dr. Earlean Shawl, patient's gastroenterologist who is unavailable and out of town.[facility] gastroenterology service consulted for endoscopy. Final diagnoses:  None   Esophageal food bolus has passed, I don't feel that he needs emergent endoscopy. He can follow-up with Dr. Earlean Shawl as outpatient.. Gastroenterology recontacted. Spoke with Ms. Trixie Deis. She suggests PPI, liquid diet for 24 hours. Small bites. Follow-up with Dr. Earlean Shawl as outpatient Diagnosis esophageal impaction     Orlie Dakin, MD 12/08/15 [phone]

## 2015-12-08 NOTE — Discharge Instructions (Signed)
Stay on clear liquids for the next 24 hours. Take Prilosec OTC as directed. You can start to eat 24 hours from now. Make sure that you chew your food well and eat small bites. Return if you feel worse for any reason . Contact Dr. Earlean Shawl on 12/11/2015 to arrange for the next available office appointment.

## 2015-12-08 NOTE — ED Notes (Signed)
Pt received 1 dose of glucagon, reports worsening pain when he swallows. Dr. Lenna Sciara made aware, will come to bedside to assess patient.

## 2015-12-08 NOTE — ED Notes (Signed)
Pt. Stated, "I feel much better."

## 2016-03-11 ENCOUNTER — Telehealth: Payer: Self-pay | Admitting: Cardiovascular Disease

## 2016-03-11 ENCOUNTER — Encounter: Payer: Self-pay | Admitting: Internal Medicine
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
## 2016-03-11 NOTE — Telephone Encounter (Signed)
Returned call. Patient wants to get previsit labwork, but wants lab slips in-hand so that he can also get labwork done for Dr. Quay Burow, if needed, prior to both visits (sees [place] Dec 6th and Dr. Quay Burow Dec 11th).  Aware I will route to Dr. Evette Georges nurse to review and order routine labwork. Pt voiced thanks for call.

## 2016-04-16 ENCOUNTER — Encounter: Payer: Self-pay | Admitting: Cardiovascular Disease

## 2016-04-22 ENCOUNTER — Ambulatory Visit (HOSPITAL_COMMUNITY): Payer: BLUE CROSS/BLUE SHIELD | Attending: Cardiology

## 2016-04-22 ENCOUNTER — Other Ambulatory Visit: Payer: Self-pay

## 2016-04-22 DIAGNOSIS — Z9889 Other specified postprocedural states: Secondary | ICD-10-CM

## 2016-05-15 ENCOUNTER — Ambulatory Visit (INDEPENDENT_AMBULATORY_CARE_PROVIDER_SITE_OTHER): Payer: BLUE CROSS/BLUE SHIELD | Admitting: Cardiovascular Disease

## 2016-05-15 ENCOUNTER — Encounter: Payer: Self-pay | Admitting: Cardiovascular Disease

## 2016-05-15 VITALS — BP 130/80 | HR 58 | Ht 72.0 in | Wt 216.0 lb

## 2016-05-15 DIAGNOSIS — I10 Essential (primary) hypertension: Secondary | ICD-10-CM

## 2016-05-15 DIAGNOSIS — Z9889 Other specified postprocedural states: Secondary | ICD-10-CM

## 2016-05-15 DIAGNOSIS — E785 Hyperlipidemia, unspecified: Secondary | ICD-10-CM | POA: Diagnosis not present

## 2016-05-15 DIAGNOSIS — C61 Malignant neoplasm of prostate: Secondary | ICD-10-CM

## 2016-05-15 DIAGNOSIS — I080 Rheumatic disorders of both mitral and aortic valves: Secondary | ICD-10-CM | POA: Diagnosis not present

## 2016-05-15 DIAGNOSIS — Z79899 Other long term (current) drug therapy: Secondary | ICD-10-CM | POA: Diagnosis not present

## 2016-05-15 DIAGNOSIS — Z8546 Personal history of malignant neoplasm of prostate: Secondary | ICD-10-CM

## 2016-05-15 LAB — COMPREHENSIVE METABOLIC PANEL
ALBUMIN: 4.4 g/dL (ref 3.6–5.1)
ALT: 15 U/L (ref 9–46)
AST: 19 U/L (ref 10–35)
Alkaline Phosphatase: 55 U/L (ref 40–115)
BUN: 16 mg/dL (ref 7–25)
CALCIUM: 9.4 mg/dL (ref 8.6–10.3)
CHLORIDE: 105 mmol/L (ref 98–110)
CO2: 24 mmol/L (ref 20–31)
Creat: 1.07 mg/dL (ref 0.70–1.25)
Glucose, Bld: 94 mg/dL (ref 65–99)
POTASSIUM: 5.3 mmol/L (ref 3.5–5.3)
SODIUM: 139 mmol/L (ref 135–146)
TOTAL PROTEIN: 6.8 g/dL (ref 6.1–8.1)
Total Bilirubin: 0.5 mg/dL (ref 0.2–1.2)

## 2016-05-15 LAB — CBC
HEMATOCRIT: 45.4 % (ref 38.5–50.0)
HEMOGLOBIN: 14.6 g/dL (ref 13.2–17.1)
MCH: 28.5 pg (ref 27.0–33.0)
MCHC: 32.2 g/dL (ref 32.0–36.0)
MCV: 88.7 fL (ref 80.0–100.0)
MPV: 9.9 fL (ref 7.5–12.5)
Platelets: 255 10*3/uL (ref 140–400)
RBC: 5.12 MIL/uL (ref 4.20–5.80)
RDW: 14.4 % (ref 11.0–15.0)
WBC: 5.4 10*3/uL (ref 3.8–10.8)

## 2016-05-15 LAB — TSH: TSH: 2.9 mIU/L (ref 0.40–4.50)

## 2016-05-15 LAB — LIPID PANEL
CHOL/HDL RATIO: 2.4 ratio (ref <5.0)
CHOLESTEROL: 157 mg/dL (ref <200)
HDL: 65 mg/dL (ref >40)
LDL CALC: 78 mg/dL (ref <100)
TRIGLYCERIDES: 69 mg/dL (ref <150)
VLDL: 14 mg/dL (ref <30)

## 2016-05-15 LAB — PSA: PSA: 0.1 ng/mL (ref <=4.0)

## 2016-05-15 NOTE — Patient Instructions (Signed)
Your physician recommends that you return for lab work in: FASTING.  Your physician wants you to follow-up in: 1 year or sooner if needed. You will receive a reminder letter in the mail two months in advance. If you don't receive a letter, please call our office to schedule the follow-up appointment.  If you need a refill on your cardiac medications before your next appointment, please call your pharmacy.     

## 2016-05-17 NOTE — Progress Notes (Signed)
Patient ID: William Lewis, male   DOB: 1950/07/08, 65 y.o.   MRN: [id number]     HPI: William Lewis is a 65 y.o. male who presents to the office for a one year cardiology followup evaluation.  Mr. [name] underwent mitral valve repair robotically by Dr. Elveria Rising [facility] a 04/10/2006 for significant mitral valve prolapse with partial flail posterior P2 leaflet. He had 4+ preoperative mitral regurgitation and also had an ASD as well as a patent foramen ovale which were also successfully repaired. At the time of his surgery, his left atrial appendage was closed.  Preoperatively, he was documented to have normal coronary arteries by cardiac catheterization. Last year he underwent a 18 month followup echo Doppler study which continued to show excellent result. Left ventricular ejection fraction was 60-65% and he did not have regional wall motion abnormalities. His mitral annular ring prosthesis was present. There is no mention in his current echo of any residual mitral regurgitation whereas on his prior echo in 2013 he was felt to have mild MR. All other valvular anatomy was normal.  He has a remote history of prostate CA which developed at age 54 at which time he underwent surgery.  And I saw him last year he was continuing to be active and had a good year.,he was playing teis regularly and riding his bike. Over the past 3 years, he had lost his brother and last year lost his mother which had led to some increased stress. He continues to do well.  He is still working at least 40-50 hours per week.  He is biking less, but playing tennis and golf without cardiovascular restrictions. He is unaware of any palpitations. He denies any chest pain.  He denies presyncope or syncope.  In June 2017 he presented to the emergency room after a piece of chicken had gotten stuck in his esophagus.  He required IV glucagon and subsequently underwent endoscopy by Dr. Earlean Shawl which reportedly was unremarkable.   He  underwent a follow-up echo Doppler study on 04/22/2016.  Ejection fraction is 60-65%.  Mild LVHwithout wall motion abnormalities.  There was grade 1 diastolic dysfunction.  He is a setting aorta was minimally icreased at 41 mm.  His mitral annulus ring/repair was intact.  There was no mention of mitral regurgion.  There was mild biatrial enlargement.  He had normal pulmonary pressures.  He is fasting today and has not had lab work since last year.  He presents for evaluation.   Past Medical History:  Diagnosis Date  . ASD (atrial septal defect) 2007   repaired with PFO in [place]  . Echocardiogram abnormal 2013   EF 34-28%, normal diastolic parameters, mild mitral regur. with his post leaflet of MV calcified  . History of stress test 2008   neg.for ischemia  . HTN (hypertension)   . MVP (mitral valve prolapse) 2007   mitral valve repair in [place]  . Other and unspecified hyperlipidemia    Dr Ellouise Newer    Past Surgical History:  Procedure Laterality Date  . CARDIAC CATHETERIZATION  2007   EF 65-70%, significant mitral prolapse wth 4+ MR, normal coronary arteries   . COLONOSCOPY  2005 ; 2010;2014   polyp 2010 , Dr Earlean Shawl  . HERNIA REPAIR  2007  . I&D EXTREMITY     thumb, for infection  . MITRAL VALVE REPAIR  2007   Robotic surgery @ [address] [place] , [place] and ASD repair and PFO repair  . PROSTATE  SURGERY  1995   TURP , Dr Serita Butcher    No Known Allergies  Current Outpatient Prescriptions  Medication Sig Dispense Refill  . aspirin 81 MG tablet Take 81 mg by mouth daily.      . Cholecalciferol (VITAMIN D3) 2000 UNITS TABS Take by mouth daily.    . fish oil-omega-3 fatty acids 1000 MG capsule Take by mouth daily.    . metoprolol succinate (TOPROL-XL) 25 MG 24 hr tablet TAKE 1/2 BY MOUTH TWICE DAILY 90 tablet 2  . ramipril (ALTACE) 5 MG capsule TAKE 1 BY MOUTH DAILY 90 capsule 2  . rosuvastatin (CRESTOR) 20 MG tablet TAKE 1 BY MOUTH AT BEDTIME 90 tablet 2   No current  facility-administered medications for this visit.     Social History   Social History  . Marital status: Married    Spouse name: N/A  . Number of children: N/A  . Years of education: N/A   Occupational History  . Not on file.   Social History Main Topics  . Smoking status: Former Smoker    Quit date: 06/10/1977  . Smokeless tobacco: Never Used     Comment: from age [age], up to 1 ppd or less  . Alcohol use 8.4 oz/week    14 Glasses of wine per week  . Drug use: No  . Sexual activity: Not on file   Other Topics Concern  . Not on file   Social History Narrative   Gets reg exercise    Family History  Problem Relation Age of Onset  . Hypertension Father   . Cancer Father     throat ; smoker  . Prostate cancer Father   . Stroke Paternal Grandmother     late 21s  . Heart disease Neg Hx   . Diabetes Neg Hx   . AAA (abdominal aortic aneurysm) Maternal Grandmother   . Cancer Mother 87    ? intra-abdominal   Social history is notable that he is married he has 2 children. He does exercise. Is no tobacco or alcohol use.  His daughter is an attorney in [place] involved with healthcare and a large firm doing exceptionally well.  His son is working in [place].   ROS General: Negative; No fevers, chills, or night sweats;  HEENT: Negative; No changes in vision or hearing, sinus congestion, difficulty swallowing Pulmonary: Negative; No cough, wheezing, shortness of breath, hemoptysis Cardiovascular: Negative; No chest pain, presyncope, syncope, palpitations GI: Negative; No nausea, vomiting, diarrhea, or abdominal pain GU: Negative; No dysuria, hematuria, or difficulty voiding Musculoskeletal: Negative; no myalgias, joint pain, or weakness Hematologic/Oncology: Negative; no easy bruising, bleeding Endocrine: Negative; no heat/cold intolerance; no diabetes Neuro: Negative; no changes in balance, headaches Skin: Negative; No rashes or skin lesions Psychiatric: Negative; No  behavioral problems, depression Sleep: Negative; No snoring, daytime sleepiness, hypersomnolence, bruxism, restless legs, hypnogognic hallucinations, no cataplexy Other comprehensive 14 point system review is negative.  PE BP 130/80 (BP Location: Right Arm, Patient Position: Sitting, Cuff Size: Normal)   Pulse (!) 58   Ht 6' (1.829 m)   Wt 216 lb (98 kg)   BMI 29.29 kg/m   Repeat blood pressure taken by me was 128/86 supine and 130/86 standing.  Wt Readings from Last 3 Encounters:  05/15/16 216 lb (98 kg)  12/08/15 212 lb (96.2 kg)  05/19/15 218 lb (98.9 kg)   General: Alert, oriented, no distress.  Skin: normal turgor, no rashes HEENT: Normocephalic, atraumatic. Pupils round and reactive; sclera anicteric;no  lid lag.  Nose without nasal septal hypertrophy Mouth/Parynx benign; Mallinpatti scale 2 Neck: No JVD, no carotid bruits with normal carotid upstroke Chest wall: Nontender to palpation Lungs: clear to ausculatation and percussion; no wheezing or rales Heart: RRR, s1 s2 normal several systolic clicks. I did not hear any murmur of mitral regurgitation. Abdomen: soft, nontender; no hepatosplenomehaly, BS+; abdominal aorta nontender and not dilated by palpation. Back: No CVA tenderness Pulses 2+ Extremities: no clubbing cyanosis or edema, Homan's sign negative  Neurologic: grossly nonfocal Psychologic: normal affect and mood.  ECG (independently read by me): sinus bradycardia 59 bpm with a PAC.  Normal intervals.  NST segment changes.  November 2016 ECG (independently read by me): Normal sinus rhythm with an isolated PAC.  Normal intervals.  No ST segment changes.  November 2015 ECG (independently read by me): NSR at 57; PR interval 220 ms.  QTc interval normal  Prior November 2014 ECG: Sinus at 71 beats per minute. No significant change the  LABS:  BMP Latest Ref Rng & Units 05/15/2016 12/08/2015 05/03/2015  Glucose 65 - 99 mg/dL 94 96 94  BUN 7 - 25 mg/dL 16 20 18     Creatinine 0.70 - 1.25 mg/dL 1.07 1.10 0.92  Sodium 135 - 146 mmol/L 139 140 140  Potassium 3.5 - 5.3 mmol/L 5.3 3.8 5.0  Chloride 98 - 110 mmol/L 105 103 104  CO2 20 - 31 mmol/L 24 - 27  Calcium 8.6 - 10.3 mg/dL 9.4 - 9.2   Hepatic Function Latest Ref Rng & Units 05/15/2016 05/03/2015 04/25/2014  Total Protein 6.1 - 8.1 g/dL 6.8 6.7 6.8  Albumin 3.6 - 5.1 g/dL 4.4 4.2 4.2  AST 10 - 35 U/L 19 17 15   ALT 9 - 46 U/L 15 16 16   Alk Phosphatase 40 - 115 U/L 55 52 50  Total Bilirubin 0.2 - 1.2 mg/dL 0.5 0.5 0.4  Bilirubin, Direct 0.0 - 0.3 mg/dL - - -   CBC Latest Ref Rng & Units 05/15/2016 12/08/2015 12/08/2015  WBC 3.8 - 10.8 K/uL 5.4 - 9.2  Hemoglobin 13.2 - 17.1 g/dL 14.6 15.3 14.4  Hematocrit 38.5 - 50.0 % 45.4 45.0 44.5  Platelets 140 - 400 K/uL 255 - 271   Lab Results  Component Value Date   MCV 88.7 05/15/2016   MCV 87.8 12/08/2015   MCV 85.6 04/25/2014   Lab Results  Component Value Date   TSH 2.90 05/15/2016  No results found for: HGBA1C   Lab Results  Component Value Date   PSA 0.1 05/15/2016   PSA <0.01 05/03/2015   PSA <0.01 04/25/2014   Lipid Panel     Component Value Date/Time   CHOL 157 05/15/2016 0953   TRIG 69 05/15/2016 0953   HDL 65 05/15/2016 0953   CHOLHDL 2.4 05/15/2016 0953   VLDL 14 05/15/2016 0953   LDLCALC 78 05/15/2016 0953   RADIOLOGY: No results found.    ASSESSMENT AND PLAN: Mr. Armistead is a 65 year old Caucasian male who is 10 years status post da Vinci robotic mitral valve repair surgery with preoperative severe MR due to a ruptured chordae which time he had placement of 4 new Gore-Tex chordae, 2 from the medial, 2 from the lateral muscle which were brought to the edges of the posterior leaflet. He also had repair of a small atrial septal defect as well as a patent foramen ovale. Preoperatively he had normal coronary arteries. He is without anginal symptomatology.  Over the past year.  He is  remained entirely asymptomatic.  He exercises  regularly.  He denies any change in exercise capacity.  Specifically, there are no arrhythmias.  He denies shortness of breath.  Is blood pressure today is well controlled on his regimen consisting of ramipril 5 mg and Toprol 12.5 mg daily. He's asymptomatic with reference to palpitations. He is on Crestor 20 mg N omega-3 fatty acids for hyperlipidemia.he is fasting today and repeat blood work will be obtained and with his remote prostate history I will recheck a PSA per his request.  I reviewed his echo Doppler study with him in detail.  The present study continues to demonstrate competence of his valve repair and ASD/PFO repair. I will contact him regarding his laboratory.  He will be establishing with a new primary physician, Dr. Celso Amy since Dr. Linna Darner has retired.  I will see him in one year for reevaluation.  Time spent: 25 minutes Troy Sine, MD, [facility]  05/17/2016 6:04 PM
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
## 2016-05-19 NOTE — Assessment & Plan Note (Signed)
lipid panel  Well controlled Continue daily statin Regular exercise and healthy diet encouraged

## 2016-05-19 NOTE — Patient Instructions (Addendum)
All other Health Maintenance issues reviewed.   All recommended immunizations and age-appropriate screenings are up-to-date or discussed.  No immunizations administered today.   Medications reviewed and updated.  No changes recommended at this time.  Your prescription(s) have been submitted to your pharmacy. Please take as directed and contact our office if you believe you are having problem(s) with the medication(s).   Please followup in one year for a physical   Health Maintenance, Male A healthy lifestyle and preventative care can promote health and wellness.  Maintain regular health, dental, and eye exams.  Eat a healthy diet. Foods like vegetables, fruits, whole grains, low-fat dairy products, and lean protein foods contain the nutrients you need and are low in calories. Decrease your intake of foods high in solid fats, added sugars, and salt. Get information about a proper diet from your health care provider, if necessary.  Regular physical exercise is one of the most important things you can do for your health. Most adults should get at least 150 minutes of moderate-intensity exercise (any activity that increases your heart rate and causes you to sweat) each week. In addition, most adults need muscle-strengthening exercises on 2 or more days a week.   Maintain a healthy weight. The body mass index (BMI) is a screening tool to identify possible weight problems. It provides an estimate of body fat based on height and weight. Your health care provider can find your BMI and can help you achieve or maintain a healthy weight. For males 20 years and older:  A BMI below 18.5 is considered underweight.  A BMI of 18.5 to 24.9 is normal.  A BMI of 25 to 29.9 is considered overweight.  A BMI of 30 and above is considered obese.  Maintain normal blood lipids and cholesterol by exercising and minimizing your intake of saturated fat. Eat a balanced diet with plenty of fruits and vegetables.  Blood tests for lipids and cholesterol should begin at age 75 and be repeated every 5 years. If your lipid or cholesterol levels are high, you are over age 60, or you are at high risk for heart disease, you may need your cholesterol levels checked more frequently.Ongoing high lipid and cholesterol levels should be treated with medicines if diet and exercise are not working.  If you smoke, find out from your health care provider how to quit. If you do not use tobacco, do not start.  Lung cancer screening is recommended for adults aged 74-80 years who are at high risk for developing lung cancer because of a history of smoking. A yearly low-dose CT scan of the lungs is recommended for people who have at least a 30-pack-year history of smoking and are current smokers or have quit within the past 15 years. A pack year of smoking is smoking an average of 1 pack of cigarettes a day for 1 year (for example, a 30-pack-year history of smoking could mean smoking 1 pack a day for 30 years or 2 packs a day for 15 years). Yearly screening should continue until the smoker has stopped smoking for at least 15 years. Yearly screening should be stopped for people who develop a health problem that would prevent them from having lung cancer treatment.  If you choose to drink alcohol, do not have more than 2 drinks per day. One drink is considered to be 12 oz (360 mL) of beer, 5 oz (150 mL) of wine, or 1.5 oz (45 mL) of liquor.  Avoid the use of  street drugs. Do not share needles with anyone. Ask for help if you need support or instructions about stopping the use of drugs.  High blood pressure causes heart disease and increases the risk of stroke. High blood pressure is more likely to develop in:  People who have blood pressure in the end of the normal range (100-139/85-89 mm Hg).  People who are overweight or obese.  People who are African American.  If you are 63-56 years of age, have your blood pressure checked  every 3-5 years. If you are 78 years of age or older, have your blood pressure checked every year. You should have your blood pressure measured twice-once when you are at a hospital or clinic, and once when you are not at a hospital or clinic. Record the average of the two measurements. To check your blood pressure when you are not at a hospital or clinic, you can use:  An automated blood pressure machine at a pharmacy.  A home blood pressure monitor.  If you are 49-84 years old, ask your health care provider if you should take aspirin to prevent heart disease.  Diabetes screening involves taking a blood sample to check your fasting blood sugar level. This should be done once every 3 years after age 72 if you are at a normal weight and without risk factors for diabetes. Testing should be considered at a younger age or be carried out more frequently if you are overweight and have at least 1 risk factor for diabetes.  Colorectal cancer can be detected and often prevented. Most routine colorectal cancer screening begins at the age of 24 and continues through age 4. However, your health care provider may recommend screening at an earlier age if you have risk factors for colon cancer. On a yearly basis, your health care provider may provide home test kits to check for hidden blood in the stool. A small camera at the end of a tube may be used to directly examine the colon (sigmoidoscopy or colonoscopy) to detect the earliest forms of colorectal cancer. Talk to your health care provider about this at age 8 when routine screening begins. A direct exam of the colon should be repeated every 5-10 years through age 62, unless early forms of precancerous polyps or small growths are found.  People who are at an increased risk for hepatitis B should be screened for this virus. You are considered at high risk for hepatitis B if:  You were born in a country where hepatitis B occurs often. Talk with your health care  provider about which countries are considered high risk.  Your parents were born in a high-risk country and you have not received a shot to protect against hepatitis B (hepatitis B vaccine).  You have HIV or AIDS.  You use needles to inject street drugs.  You live with, or have sex with, someone who has hepatitis B.  You are a man who has sex with other men (MSM).  You get hemodialysis treatment.  You take certain medicines for conditions like cancer, organ transplantation, and autoimmune conditions.  Hepatitis C blood testing is recommended for all people born from [birth year] through 1967 and any individual with known risk factors for hepatitis C.  Healthy men should no longer receive prostate-specific antigen (PSA) blood tests as part of routine cancer screening. Talk to your health care provider about prostate cancer screening.  Testicular cancer screening is not recommended for adolescents or adult males who have no symptoms. Screening  includes self-exam, a health care provider exam, and other screening tests. Consult with your health care provider about any symptoms you have or any concerns you have about testicular cancer.  Practice safe sex. Use condoms and avoid high-risk sexual practices to reduce the spread of sexually transmitted infections (STIs).  You should be screened for STIs, including gonorrhea and chlamydia if:  You are sexually active and are younger than 24 years.  You are older than 24 years, and your health care provider tells you that you are at risk for this type of infection.  Your sexual activity has changed since you were last screened, and you are at an increased risk for chlamydia or gonorrhea. Ask your health care provider if you are at risk.  If you are at risk of being infected with HIV, it is recommended that you take a prescription medicine daily to prevent HIV infection. This is called pre-exposure prophylaxis (PrEP). You are considered at risk if:  You  are a man who has sex with other men (MSM).  You are a heterosexual man who is sexually active with multiple partners.  You take drugs by injection.  You are sexually active with a partner who has HIV.  Talk with your health care provider about whether you are at high risk of being infected with HIV. If you choose to begin PrEP, you should first be tested for HIV. You should then be tested every 3 months for as long as you are taking PrEP.  Use sunscreen. Apply sunscreen liberally and repeatedly throughout the day. You should seek shade when your shadow is shorter than you. Protect yourself by wearing long sleeves, pants, a wide-brimmed hat, and sunglasses year round whenever you are outdoors.  Tell your health care provider of new moles or changes in moles, especially if there is a change in shape or color. Also, tell your health care provider if a mole is larger than the size of a pencil eraser.  A one-time screening for abdominal aortic aneurysm (AAA) and surgical repair of large AAAs by ultrasound is recommended for men aged 52-75 years who are current or former smokers.  Stay current with your vaccines (immunizations). This information is not intended to replace advice given to you by your health care provider. Make sure you discuss any questions you have with your health care provider. Document Released: 11/23/2007 Document Revised: 06/17/2014 Document Reviewed: 02/28/2015 Elsevier Interactive Patient Education  2017 [employer].

## 2016-05-20 ENCOUNTER — Ambulatory Visit (INDEPENDENT_AMBULATORY_CARE_PROVIDER_SITE_OTHER): Payer: BLUE CROSS/BLUE SHIELD | Admitting: Internal Medicine

## 2016-05-20 ENCOUNTER — Encounter: Payer: Self-pay | Admitting: Internal Medicine

## 2016-05-20 VITALS — BP 120/84 | HR 73 | Temp 98.3°F | Resp 16 | Ht 72.0 in | Wt 215.0 lb

## 2016-05-20 DIAGNOSIS — M79605 Pain in left leg: Secondary | ICD-10-CM | POA: Diagnosis not present

## 2016-05-20 DIAGNOSIS — I10 Essential (primary) hypertension: Secondary | ICD-10-CM | POA: Diagnosis not present

## 2016-05-20 DIAGNOSIS — Z8546 Personal history of malignant neoplasm of prostate: Secondary | ICD-10-CM

## 2016-05-20 DIAGNOSIS — E785 Hyperlipidemia, unspecified: Secondary | ICD-10-CM | POA: Diagnosis not present

## 2016-05-20 DIAGNOSIS — Z0000 Encounter for general adult medical examination without abnormal findings: Secondary | ICD-10-CM | POA: Diagnosis not present

## 2016-05-20 DIAGNOSIS — E559 Vitamin D deficiency, unspecified: Secondary | ICD-10-CM

## 2016-05-20 NOTE — Assessment & Plan Note (Signed)
?   Leg pain related to arthritis of hip or knee Refer to sports medicine - Dr Oneida Alar

## 2016-05-20 NOTE — Progress Notes (Signed)
Pre visit review using our clinic review tool, if applicable. No additional management support is needed unless otherwise documented below in the visit note. 

## 2016-05-20 NOTE — Assessment & Plan Note (Addendum)
Following with urology Recent psa 0.1

## 2016-05-20 NOTE — Assessment & Plan Note (Signed)
BP well controlled Current regimen effective and well tolerated Continue current medications at current doses  

## 2016-05-20 NOTE — Assessment & Plan Note (Signed)
Taking vit d daily

## 2016-06-25 ENCOUNTER — Encounter: Payer: Self-pay | Admitting: Sports Medicine

## 2016-06-25 ENCOUNTER — Ambulatory Visit: Payer: Self-pay

## 2016-06-25 ENCOUNTER — Ambulatory Visit (INDEPENDENT_AMBULATORY_CARE_PROVIDER_SITE_OTHER): Payer: BLUE CROSS/BLUE SHIELD | Admitting: Sports Medicine

## 2016-06-25 VITALS — BP 130/78 | Ht 72.0 in | Wt 215.0 lb

## 2016-06-25 DIAGNOSIS — M79605 Pain in left leg: Secondary | ICD-10-CM

## 2016-06-25 DIAGNOSIS — M79662 Pain in left lower leg: Secondary | ICD-10-CM | POA: Diagnosis not present

## 2016-06-25 NOTE — Assessment & Plan Note (Signed)
Instructions for HEP Use of heel lift for hiking Compression if needed  Reck prn

## 2016-06-25 NOTE — Progress Notes (Signed)
  2 years ago Pop in left calf sprinting Hard to go up steps Difficult to walk then Not much swelling Since then comes and goes Feels better at times with more exercise Feels less power on left  Past Hx Prostate cancer 1995  ROS No low back pain No sciatica Knees stable on tennis Recent URI sxs  PE Fit appearing W M, NAD BP 130/78   Ht 6' (1.829 m)   Wt 215 lb (97.5 kg)   BMI 29.16 kg/m   Left calf shows normal contour No TTP Circumference is 1.5 cms less than RT  Left Hip - norm ROM  Left Knee: Normal to inspection with no erythema or effusion or obvious bony abnormalities. Palpation normal with no warmth or joint line tenderness or patellar tenderness or condyle tenderness. ROM normal in flexion and extension and lower leg rotation. Ligaments with solid consistent endpoints including ACL, PCL, LCL, MCL. Negative Mcmurray's and provocative meniscal tests. Non painful patellar compression. Patellar and quadriceps tendons unremarkable. Hamstring and quadriceps strength is normal.  Feet show pes planus with neutral position  Ultrasound of Left Calf  Achilles tendon is normal Calcaneal spur at insertion Lateral gastroc shows normal muscle architecture but atrophy Medial gastroc shows normal architecture with exception of small area of insertion that shows increased hypoechoic changes at insertion to fascia Soleus appears normal  Summary: consistent with remote tear in medial gastrocnemius muscle but architecture is essentially normal today  Ultrasound and interpretation by Peterson Ao B. Oneida Alar, MD

## 2016-06-25 NOTE — Patient Instructions (Signed)
Calf exercises Try to do these for prevention 3 to 4 x per week Knee straight Knee bent  Your left calf is 1.5 cms smaller Evidence of an old tear that healed But you never regained full strength  Use a heel lift  That helps prevent a repeat injury

## 2016-06-26 ENCOUNTER — Ambulatory Visit: Payer: BLUE CROSS/BLUE SHIELD | Admitting: Sports Medicine

## 2016-06-28 ENCOUNTER — Encounter: Payer: Self-pay | Admitting: Internal Medicine

## 2016-06-28 ENCOUNTER — Ambulatory Visit (INDEPENDENT_AMBULATORY_CARE_PROVIDER_SITE_OTHER): Payer: BLUE CROSS/BLUE SHIELD | Admitting: Internal Medicine

## 2016-06-28 DIAGNOSIS — J209 Acute bronchitis, unspecified: Secondary | ICD-10-CM

## 2016-06-28 MED ORDER — DOXYCYCLINE HYCLATE 100 MG PO TABS: 100.0000 mg | ORAL_TABLET | Freq: Two times a day (BID) | ORAL | 0 refills | Status: DC

## 2016-06-28 NOTE — Progress Notes (Signed)
   Subjective:    Patient ID: STEPHANE AUKERMAN, male    DOB: Aug 13, 1950, 66 y.o.   MRN: [id number]  HPI The patient is a 66 YO man coming in for cough for 1 month. He denies fevers or chills. Had a cold at the onset. He has since had the drainage and congestion disappear mostly. The cough has held on and is triggered with cold air. Started around December 12th. Able to do normal activities still unless he gets into a coughing fit. Non-smoker. No real SOB. Has taken some OTC cold medications at first but they were not helpful so he stopped taking it.   Review of Systems  Constitutional: Negative for activity change, appetite change, chills, fatigue, fever and unexpected weight change.  HENT: Positive for congestion and rhinorrhea. Negative for ear discharge, ear pain, nosebleeds, postnasal drip, sinus pain, sinus pressure, sore throat and trouble swallowing.   Eyes: Negative.   Respiratory: Positive for cough. Negative for chest tightness, shortness of breath and wheezing.   Cardiovascular: Negative.   Gastrointestinal: Negative.   Musculoskeletal: Negative.       Objective:   Physical Exam  Constitutional: He is oriented to person, place, and time. He appears well-developed and well-nourished.  HENT:  Head: Normocephalic and atraumatic.  Right Ear: External ear normal.  Left Ear: External ear normal.  Oropharynx with redness and clear drainage, no nasal crusting, no sinus pressure.   Eyes: EOM are normal.  Neck: Normal range of motion.  Cardiovascular: Normal rate and regular rhythm.   Pulmonary/Chest: Effort normal. No respiratory distress. He has wheezes. He has no rales.  Some coarse rhonchi on the left side with mild expiratory wheezing.   Abdominal: Soft. He exhibits no distension. There is no tenderness. There is no rebound.  Neurological: He is alert and oriented to person, place, and time.  Skin: Skin is warm and dry.   Vitals:   06/28/16 1444  BP: 132/86  Pulse: 60  Resp:  18  Temp: 98 F (36.7 C)  TempSrc: Oral  SpO2: 98%  Weight: 214 lb (97.1 kg)  Height: 6' (1.829 m)      Assessment & Plan:

## 2016-06-28 NOTE — Progress Notes (Signed)
Pre visit review using our clinic review tool, if applicable. No additional management support is needed unless otherwise documented below in the visit note. 

## 2016-06-28 NOTE — Assessment & Plan Note (Signed)
Rx for doxycycline for coarse rhonchi on exam.

## 2016-06-28 NOTE — Patient Instructions (Signed)
We have sent in doxycycline for the lungs. Take 1 pill twice a day for 1 week.    

## 2016-07-10 ENCOUNTER — Other Ambulatory Visit: Payer: Self-pay

## 2016-07-10 MED ORDER — METOPROLOL SUCCINATE ER 25 MG PO TB24: ORAL_TABLET | ORAL | 2 refills | Status: DC

## 2016-07-10 MED ORDER — ROSUVASTATIN CALCIUM 20 MG PO TABS: ORAL_TABLET | ORAL | 2 refills | Status: DC

## 2016-07-10 MED ORDER — RAMIPRIL 5 MG PO CAPS: ORAL_CAPSULE | ORAL | 2 refills | Status: DC

## 2016-11-18 IMAGING — CR DG CHEST 1V PORT
1 series · 1 of 1 positions shown · non-contrast
Comparison: None.

CLINICAL DATA: Dysphagia

EXAM:
PORTABLE CHEST 1 VIEW

[AP]
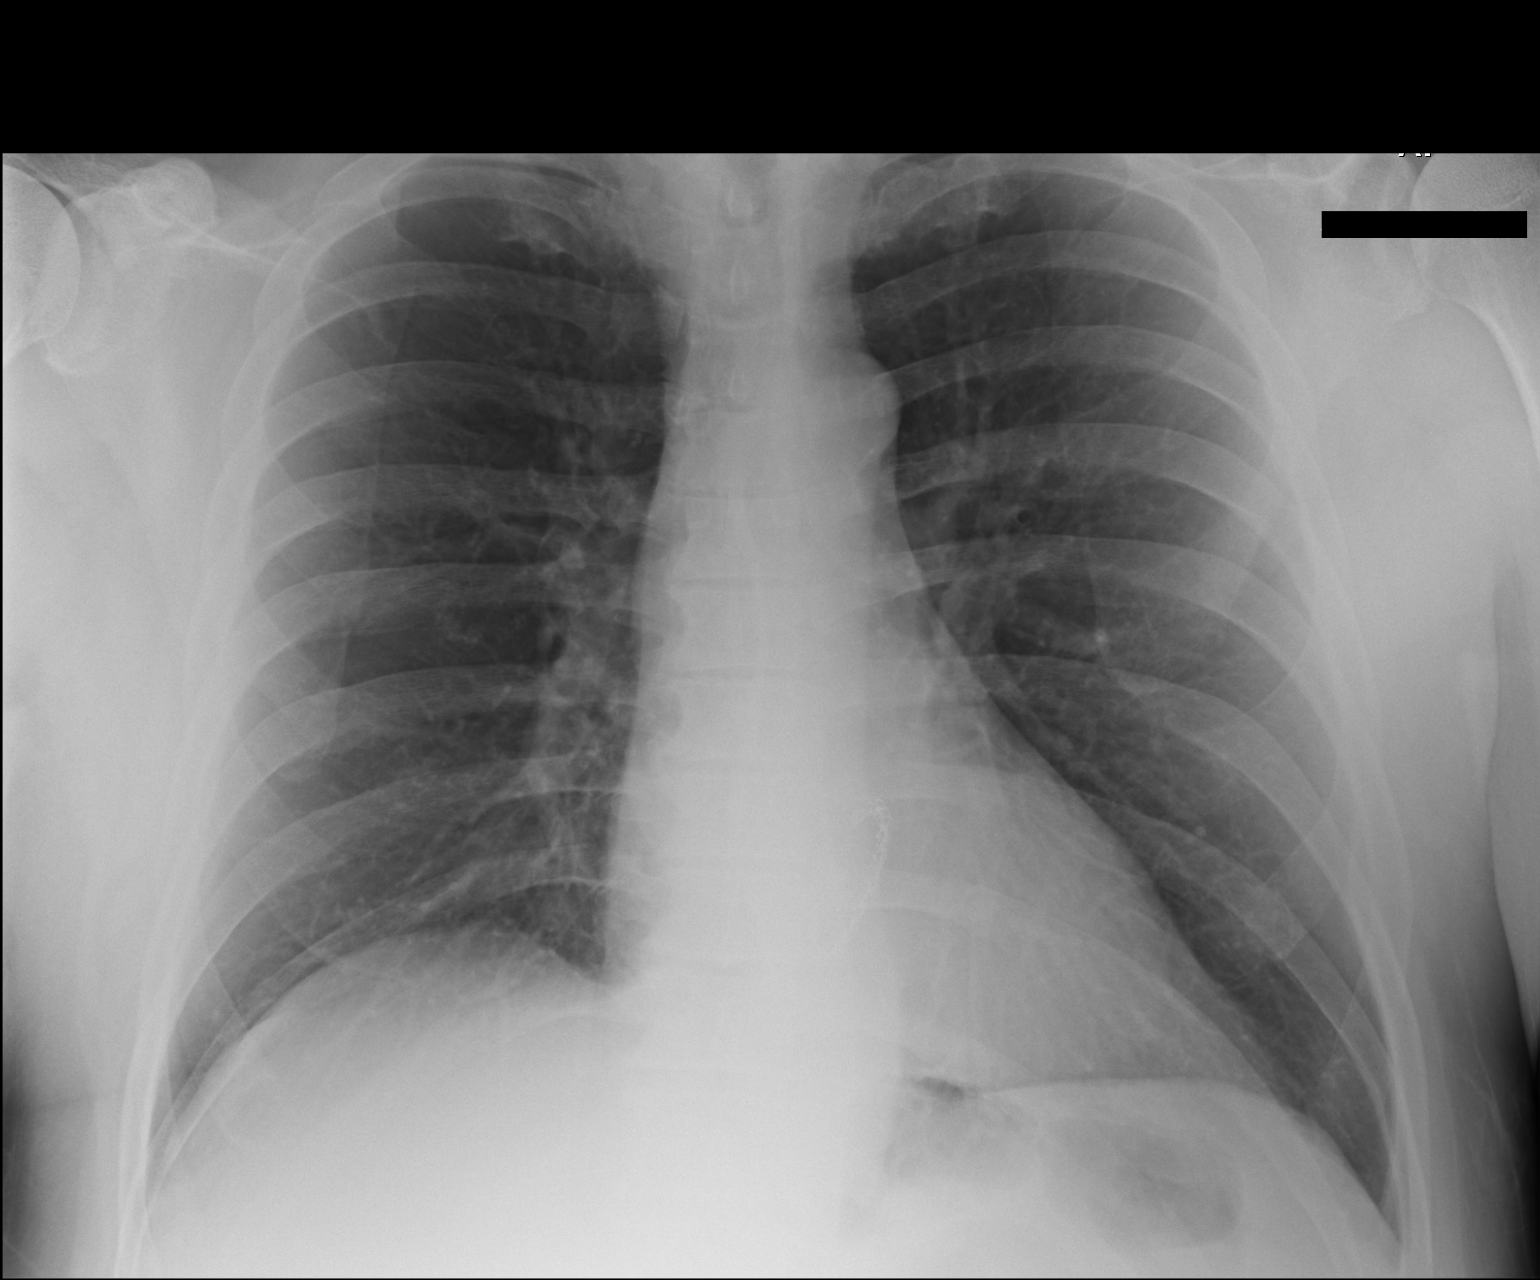

[1 of 1 positions shown; findings below may reference images not displayed]

FINDINGS: Lungs are clear. Heart size and pulmonary vascularity are normal. No
adenopathy. No pneumomediastinum or pneumothorax evident. No bone
lesions. Patient is status post mitral valve replacement.
IMPRESSION: No edema or consolidation. No apparent pneumothorax or
pneumomediastinum. Status post mitral valve replacement.

## 2017-03-03 ENCOUNTER — Encounter: Payer: Self-pay | Admitting: Internal Medicine

## 2017-03-11 ENCOUNTER — Other Ambulatory Visit: Payer: Self-pay | Admitting: Cardiovascular Disease

## 2017-03-14 ENCOUNTER — Other Ambulatory Visit: Payer: Self-pay

## 2017-03-14 MED ORDER — METOPROLOL SUCCINATE ER 25 MG PO TB24: 12.5000 mg | ORAL_TABLET | Freq: Two times a day (BID) | ORAL | 0 refills | Status: DC

## 2017-03-15 ENCOUNTER — Other Ambulatory Visit: Payer: Self-pay | Admitting: Cardiovascular Disease

## 2017-05-19 NOTE — Progress Notes (Signed)
Subjective:    Patient ID: William Lewis, male    DOB: Sep 19, 1950, 66 y.o.   MRN: [id number]  HPI      Medications and allergies reviewed with patient and updated if appropriate.  Patient Active Problem List   Diagnosis Date Noted  . Acute bronchitis 06/28/2016  . Left leg pain 05/20/2016  . S/P MVR (mitral valve repair),2007 along with ASD repair 04/26/2013  . Hx of colonic polyps 04/14/2013  . Vitamin D deficiency 03/30/2012  . Essential hypertension 06/29/2010  . Hyperlipidemia 10/21/2007  . MITRAL REGURGITATION 10/21/2007  . HX, PERSONAL, MALIGNANCY, PROSTATE 10/23/2006    Current Outpatient Medications on File Prior to Visit  Medication Sig Dispense Refill  . aspirin 81 MG tablet Take 81 mg by mouth daily.      . Cholecalciferol (VITAMIN D3) 2000 UNITS TABS Take by mouth daily.    Marland Kitchen doxycycline (VIBRA-TABS) 100 MG tablet Take 1 tablet (100 mg total) by mouth 2 (two) times daily. 14 tablet 0  . fish oil-omega-3 fatty acids 1000 MG capsule Take by mouth daily.    . metoprolol succinate (TOPROL-XL) 25 MG 24 hr tablet Take 0.5 tablets (12.5 mg total) by mouth 2 (two) times daily. 90 tablet 0  . ramipril (ALTACE) 5 MG capsule TAKE ONE CAPSULE BY MOUTH DAILY. GENERIC EQUIVALENT FOR ALTACE 90 capsule 0  . rosuvastatin (CRESTOR) 20 MG tablet TAKE 1 TABLET BY MOUTH AT BEDTIME 90 tablet 0   No current facility-administered medications on file prior to visit.     Past Medical History:  Diagnosis Date  . ASD (atrial septal defect) 2007   repaired with PFO in [place]  . Echocardiogram abnormal 2013   EF 40-98%, normal diastolic parameters, mild mitral regur. with his post leaflet of MV calcified  . History of stress test 2008   neg.for ischemia  . HTN (hypertension)   . MVP (mitral valve prolapse) 2007   mitral valve repair in [place]  . Other and unspecified hyperlipidemia    Dr Ellouise Newer    Past Surgical History:  Procedure Laterality Date  . CARDIAC  CATHETERIZATION  2007   EF 65-70%, significant mitral prolapse wth 4+ MR, normal coronary arteries   . COLONOSCOPY  2005 ; 2010;2014   polyp 2010 , Dr Earlean Shawl  . HERNIA REPAIR  2007  . I&D EXTREMITY     thumb, for infection  . MITRAL VALVE REPAIR  2007   Robotic surgery @ [address] [place] , [place] and ASD repair and PFO repair  . PROSTATE SURGERY  1995   TURP , Dr Serita Butcher    Social History   Socioeconomic History  . Marital status: Married    Spouse name: Not on file  . Number of children: Not on file  . Years of education: Not on file  . Highest education level: Not on file  Social Needs  . Financial resource strain: Not on file  . Food insecurity - worry: Not on file  . Food insecurity - inability: Not on file  . Transportation needs - medical: Not on file  . Transportation needs - non-medical: Not on file  Occupational History  . Not on file  Tobacco Use  . Smoking status: Former Smoker    Last attempt to quit: 06/10/1977    Years since quitting: 39.9  . Smokeless tobacco: Never Used  . Tobacco comment: from age [age], up to 1 ppd or less  Substance and Sexual Activity  . Alcohol use:  Yes    Alcohol/week: 8.4 oz    Types: 14 Glasses of wine per week  . Drug use: No  . Sexual activity: Not on file  Other Topics Concern  . Not on file  Social History Narrative   Gets reg exercise    Family History  Problem Relation Age of Onset  . Hypertension Father   . Cancer Father        throat ; smoker  . Prostate cancer Father   . Stroke Paternal Grandmother        late 57s  . AAA (abdominal aortic aneurysm) Maternal Grandmother   . Cancer Mother 45       ? intra-abdominal  . Heart disease Neg Hx   . Diabetes Neg Hx     Review of Systems     Objective:  There were no vitals filed for this visit. There were no vitals filed for this visit. There is no height or weight on file to calculate BMI.  Wt Readings from Last 3 Encounters:  06/28/16 214 lb (97.1 kg)    06/25/16 215 lb (97.5 kg)  05/20/16 215 lb (97.5 kg)     Physical Exam          Assessment & Plan:         This encounter was created in error - please disregard.

## 2017-05-21 ENCOUNTER — Encounter: Payer: Self-pay | Admitting: Internal Medicine

## 2017-05-21 ENCOUNTER — Ambulatory Visit (INDEPENDENT_AMBULATORY_CARE_PROVIDER_SITE_OTHER): Payer: BLUE CROSS/BLUE SHIELD | Admitting: Internal Medicine

## 2017-05-21 ENCOUNTER — Other Ambulatory Visit (INDEPENDENT_AMBULATORY_CARE_PROVIDER_SITE_OTHER): Payer: BLUE CROSS/BLUE SHIELD

## 2017-05-21 ENCOUNTER — Encounter: Payer: BLUE CROSS/BLUE SHIELD | Admitting: Internal Medicine

## 2017-05-21 VITALS — BP 136/84 | HR 77 | Temp 98.2°F | Resp 16 | Ht 72.0 in | Wt 217.0 lb

## 2017-05-21 DIAGNOSIS — E785 Hyperlipidemia, unspecified: Secondary | ICD-10-CM

## 2017-05-21 DIAGNOSIS — I10 Essential (primary) hypertension: Secondary | ICD-10-CM

## 2017-05-21 DIAGNOSIS — Z8546 Personal history of malignant neoplasm of prostate: Secondary | ICD-10-CM

## 2017-05-21 DIAGNOSIS — Z0000 Encounter for general adult medical examination without abnormal findings: Secondary | ICD-10-CM

## 2017-05-21 LAB — CBC WITH DIFFERENTIAL/PLATELET
BASOS ABS: 0.1 10*3/uL (ref 0.0–0.1)
BASOS PCT: 1 % (ref 0.0–3.0)
EOS PCT: 1.7 % (ref 0.0–5.0)
Eosinophils Absolute: 0.1 10*3/uL (ref 0.0–0.7)
HEMATOCRIT: 46.3 % (ref 39.0–52.0)
Hemoglobin: 15 g/dL (ref 13.0–17.0)
LYMPHS ABS: 1.3 10*3/uL (ref 0.7–4.0)
Lymphocytes Relative: 17.7 % (ref 12.0–46.0)
MCHC: 32.3 g/dL (ref 30.0–36.0)
MCV: 90.8 fl (ref 78.0–100.0)
Monocytes Absolute: 0.6 10*3/uL (ref 0.1–1.0)
Monocytes Relative: 9 % (ref 3.0–12.0)
NEUTROS ABS: 5.1 10*3/uL (ref 1.4–7.7)
NEUTROS PCT: 70.6 % (ref 43.0–77.0)
PLATELETS: 244 10*3/uL (ref 150.0–400.0)
RBC: 5.1 Mil/uL (ref 4.22–5.81)
RDW: 13.7 % (ref 11.5–15.5)
WBC: 7.2 10*3/uL (ref 4.0–10.5)

## 2017-05-21 LAB — COMPREHENSIVE METABOLIC PANEL
ALT: 13 U/L (ref 0–53)
AST: 15 U/L (ref 0–37)
Albumin: 4.5 g/dL (ref 3.5–5.2)
Alkaline Phosphatase: 50 U/L (ref 39–117)
BILIRUBIN TOTAL: 0.6 mg/dL (ref 0.2–1.2)
BUN: 13 mg/dL (ref 6–23)
CALCIUM: 9.3 mg/dL (ref 8.4–10.5)
CHLORIDE: 101 meq/L (ref 96–112)
CO2: 27 meq/L (ref 19–32)
Creatinine, Ser: 1.07 mg/dL (ref 0.40–1.50)
GFR: 73.02 mL/min (ref >60.00)
GLUCOSE: 95 mg/dL (ref 70–99)
POTASSIUM: 4.4 meq/L (ref 3.5–5.1)
Sodium: 138 mEq/L (ref 135–145)
Total Protein: 7.3 g/dL (ref 6.0–8.3)

## 2017-05-21 LAB — LIPID PANEL
CHOL/HDL RATIO: 3
Cholesterol: 151 mg/dL (ref 0–200)
HDL: 59.5 mg/dL (ref >39.00)
LDL CALC: 73 mg/dL (ref 0–99)
NONHDL: 91.58
TRIGLYCERIDES: 92 mg/dL (ref 0.0–149.0)
VLDL: 18.4 mg/dL (ref 0.0–40.0)

## 2017-05-21 LAB — TSH: TSH: 3.67 u[IU]/mL (ref 0.35–4.50)

## 2017-05-21 NOTE — Progress Notes (Addendum)
Subjective:    Patient ID: William Lewis, male    DOB: [birth year], 66 y.o.   MRN: [id number]  HPI The patient is here for follow up.  He has no concerns.  He denies updates.    Medications and allergies reviewed with patient and updated if appropriate.  Patient Active Problem List   Diagnosis Date Noted  . Left leg pain 05/20/2016  . S/P MVR (mitral valve repair),2007 along with ASD repair 04/26/2013  . Hx of colonic polyps 04/14/2013  . Vitamin D deficiency 03/30/2012  . Essential hypertension 06/29/2010  . Hyperlipidemia 10/21/2007  . MITRAL REGURGITATION 10/21/2007  . HX, PERSONAL, MALIGNANCY, PROSTATE 10/23/2006    Current Outpatient Medications on File Prior to Visit  Medication Sig Dispense Refill  . aspirin 81 MG tablet Take 81 mg by mouth daily.      . Cholecalciferol (VITAMIN D3) 2000 UNITS TABS Take by mouth daily.    . fish oil-omega-3 fatty acids 1000 MG capsule Take by mouth daily.    . metoprolol succinate (TOPROL-XL) 25 MG 24 hr tablet Take 0.5 tablets (12.5 mg total) by mouth 2 (two) times daily. 90 tablet 0  . ramipril (ALTACE) 5 MG capsule TAKE ONE CAPSULE BY MOUTH DAILY. GENERIC EQUIVALENT FOR ALTACE 90 capsule 0  . rosuvastatin (CRESTOR) 20 MG tablet TAKE 1 TABLET BY MOUTH AT BEDTIME 90 tablet 0   No current facility-administered medications on file prior to visit.     Past Medical History:  Diagnosis Date  . ASD (atrial septal defect) 2007   repaired with PFO in [place]  . Echocardiogram abnormal 2013   EF 40-98%, normal diastolic parameters, mild mitral regur. with his post leaflet of MV calcified  . History of stress test 2008   neg.for ischemia  . HTN (hypertension)   . MVP (mitral valve prolapse) 2007   mitral valve repair in [place]  . Other and unspecified hyperlipidemia    Dr Ellouise Newer    Past Surgical History:  Procedure Laterality Date  . CARDIAC CATHETERIZATION  2007   EF 65-70%, significant mitral prolapse wth 4+ MR, normal  coronary arteries   . COLONOSCOPY  2005 ; 2010;2014   polyp 2010 , Dr Earlean Shawl  . HERNIA REPAIR  2007  . I&D EXTREMITY     thumb, for infection  . MITRAL VALVE REPAIR  2007   Robotic surgery @ [address] [place] , [place] and ASD repair and PFO repair  . PROSTATE SURGERY  1995   TURP , Dr Serita Butcher    Social History   Socioeconomic History  . Marital status: Married    Spouse name: None  . Number of children: None  . Years of education: None  . Highest education level: None  Social Needs  . Financial resource strain: None  . Food insecurity - worry: None  . Food insecurity - inability: None  . Transportation needs - medical: None  . Transportation needs - non-medical: None  Occupational History  . None  Tobacco Use  . Smoking status: Former Smoker    Last attempt to quit: 06/10/1977    Years since quitting: 39.9  . Smokeless tobacco: Never Used  . Tobacco comment: from age [age], up to 1 ppd or less  Substance and Sexual Activity  . Alcohol use: Yes    Alcohol/week: 8.4 oz    Types: 14 Glasses of wine per week  . Drug use: No  . Sexual activity: None  Other Topics Concern  .  None  Social History Narrative   Gets reg exercise    Family History  Problem Relation Age of Onset  . Hypertension Father   . Cancer Father        throat ; smoker  . Prostate cancer Father   . Stroke Paternal Grandmother        late 72s  . AAA (abdominal aortic aneurysm) Maternal Grandmother   . Cancer Mother 64       ? intra-abdominal  . Heart disease Neg Hx   . Diabetes Neg Hx     Review of Systems  Constitutional: Negative for chills and fever.  Eyes: Negative for visual disturbance.  Respiratory: Negative for cough, shortness of breath and wheezing.   Cardiovascular: Negative for chest pain, palpitations and leg swelling.  Gastrointestinal: Negative for abdominal pain, blood in stool, constipation, diarrhea and nausea.       Rare gerd  Genitourinary: Negative for difficulty urinating,  dysuria and hematuria.  Musculoskeletal: Negative for arthralgias, back pain and myalgias.  Skin: Negative for color change and rash.  Neurological: Negative for light-headedness and headaches.  Psychiatric/Behavioral: Negative for dysphoric mood. The patient is not nervous/anxious.        Objective:   Vitals:   05/21/17 0957  BP: 136/84  Pulse: 77  Resp: 16  Temp: 98.2 F (36.8 C)  SpO2: 98%   Wt Readings from Last 3 Encounters:  05/21/17 217 lb (98.4 kg)  06/28/16 214 lb (97.1 kg)  06/25/16 215 lb (97.5 kg)   Body mass index is 29.43 kg/m.   Physical Exam    Constitutional: He appears well-developed and well-nourished. No distress.  HENT:  Head: Normocephalic and atraumatic.  Right Ear: External ear normal.  Left Ear: External ear normal.  Mouth/Throat: Oropharynx is clear and moist.  Normal ear canals and TM b/l  Eyes: Conjunctivae and EOM are normal.  Neck: Neck supple. No tracheal deviation present. No thyromegaly present.  No carotid bruit  Cardiovascular: Normal rate, regular rhythm, normal heart sounds and intact distal pulses.   No murmur heard. Pulmonary/Chest: Effort normal and breath sounds normal. No respiratory distress. He has no wheezes. He has no rales.  Abdominal: Soft. He exhibits no distension. There is no tenderness.  Genitourinary: deferred  Musculoskeletal: He exhibits no edema.  Lymphadenopathy:   He has no cervical adenopathy.  Skin: Skin is warm and dry. He is not diaphoretic.  Psychiatric: He has a normal mood and affect. His behavior is normal.       Assessment & Plan:    Physical exam: Screening blood work  ordered Immunizations  discussed shingrix, others up to date Colonoscopy   Up to date  Eye exams  Up to date  EKG  Last EKG was 2017 Exercise  Tennis 2-3 times a week, bike riding, walk dogs Weight  Has lost wiehgt an dwants to continue weight loss efforts Skin  Substance abuse  See Problem List for Assessment and Plan  of chronic medical problems.

## 2017-05-21 NOTE — Patient Instructions (Addendum)
Test(s) ordered today. Your results will be released to MyChart (or called to you) after review, usually within 72hours after test completion. If any changes need to be made, you will be notified at that same time.  All other Health Maintenance issues reviewed.   All recommended immunizations and age-appropriate screenings are up-to-date or discussed.  No immunizations administered today.   Medications reviewed and updated.  No changes recommended at this time.   Please followup in one year    Health Maintenance, Male A healthy lifestyle and preventive care is important for your health and wellness. Ask your health care provider about what schedule of regular examinations is right for you. What should I know about weight and diet? Eat a Healthy Diet  Eat plenty of vegetables, fruits, whole grains, low-fat dairy products, and lean protein.  Do not eat a lot of foods high in solid fats, added sugars, or salt.  Maintain a Healthy Weight Regular exercise can help you achieve or maintain a healthy weight. You should:  Do at least 150 minutes of exercise each week. The exercise should increase your heart rate and make you sweat (moderate-intensity exercise).  Do strength-training exercises at least twice a week.  Watch Your Levels of Cholesterol and Blood Lipids  Have your blood tested for lipids and cholesterol every 5 years starting at 68 years of age. If you are at high risk for heart disease, you should start having your blood tested when you are 68 years old. You may need to have your cholesterol levels checked more often if: ? Your lipid or cholesterol levels are high. ? You are older than 68 years of age. ? You are at high risk for heart disease.  What should I know about cancer screening? Many types of cancers can be detected early and may often be prevented. Lung Cancer  You should be screened every year for lung cancer if: ? You are a current smoker who has smoked for  at least 30 years. ? You are a former smoker who has quit within the past 15 years.  Talk to your health care provider about your screening options, when you should start screening, and how often you should be screened.  Colorectal Cancer  Routine colorectal cancer screening usually begins at 68 years of age and should be repeated every 5-10 years until you are 68 years old. You may need to be screened more often if early forms of precancerous polyps or small growths are found. Your health care provider may recommend screening at an earlier age if you have risk factors for colon cancer.  Your health care provider may recommend using home test kits to check for hidden blood in the stool.  A small camera at the end of a tube can be used to examine your colon (sigmoidoscopy or colonoscopy). This checks for the earliest forms of colorectal cancer.  Prostate and Testicular Cancer  Depending on your age and overall health, your health care provider may do certain tests to screen for prostate and testicular cancer.  Talk to your health care provider about any symptoms or concerns you have about testicular or prostate cancer.  Skin Cancer  Check your skin from head to toe regularly.  Tell your health care provider about any new moles or changes in moles, especially if: ? There is a change in a mole's size, shape, or color. ? You have a mole that is larger than a pencil eraser.  Always use sunscreen. Apply sunscreen liberally   and repeat throughout the day.  Protect yourself by wearing long sleeves, pants, a wide-brimmed hat, and sunglasses when outside.  What should I know about heart disease, diabetes, and high blood pressure?  If you are 18-39 years of age, have your blood pressure checked every 3-5 years. If you are 40 years of age or older, have your blood pressure checked every year. You should have your blood pressure measured twice-once when you are at a hospital or clinic, and once  when you are not at a hospital or clinic. Record the average of the two measurements. To check your blood pressure when you are not at a hospital or clinic, you can use: ? An automated blood pressure machine at a pharmacy. ? A home blood pressure monitor.  Talk to your health care provider about your target blood pressure.  If you are between 45-79 years old, ask your health care provider if you should take aspirin to prevent heart disease.  Have regular diabetes screenings by checking your fasting blood sugar level. ? If you are at a normal weight and have a low risk for diabetes, have this test once every three years after the age of 45. ? If you are overweight and have a high risk for diabetes, consider being tested at a younger age or more often.  A one-time screening for abdominal aortic aneurysm (AAA) by ultrasound is recommended for men aged 65-75 years who are current or former smokers. What should I know about preventing infection? Hepatitis B If you have a higher risk for hepatitis B, you should be screened for this virus. Talk with your health care provider to find out if you are at risk for hepatitis B infection. Hepatitis C Blood testing is recommended for:  Everyone born from 1945 through 1965.  Anyone with known risk factors for hepatitis C.  Sexually Transmitted Diseases (STDs)  You should be screened each year for STDs including gonorrhea and chlamydia if: ? You are sexually active and are younger than 68 years of age. ? You are older than 68 years of age and your health care provider tells you that you are at risk for this type of infection. ? Your sexual activity has changed since you were last screened and you are at an increased risk for chlamydia or gonorrhea. Ask your health care provider if you are at risk.  Talk with your health care provider about whether you are at high risk of being infected with HIV. Your health care provider may recommend a prescription  medicine to help prevent HIV infection.  What else can I do?  Schedule regular health, dental, and eye exams.  Stay current with your vaccines (immunizations).  Do not use any tobacco products, such as cigarettes, chewing tobacco, and e-cigarettes. If you need help quitting, ask your health care provider.  Limit alcohol intake to no more than 2 drinks per day. One drink equals 12 ounces of beer, 5 ounces of wine, or 1 ounces of hard liquor.  Do not use street drugs.  Do not share needles.  Ask your health care provider for help if you need support or information about quitting drugs.  Tell your health care provider if you often feel depressed.  Tell your health care provider if you have ever been abused or do not feel safe at home. This information is not intended to replace advice given to you by your health care provider. Make sure you discuss any questions you have with your health   care provider. Document Released: 11/23/2007 Document Revised: 01/24/2016 Document Reviewed: 02/28/2015 Elsevier Interactive Patient Education  2018 [employer]  

## 2017-05-21 NOTE — Assessment & Plan Note (Signed)
BP well controlled Current regimen effective and well tolerated Continue current medications at current doses Cmp, tsh 

## 2017-05-21 NOTE — Assessment & Plan Note (Signed)
Check lipid panel, tsh  Continue daily statin Regular exercise and healthy diet encouraged  

## 2017-05-21 NOTE — Assessment & Plan Note (Addendum)
Will check psa No longer following with urology

## 2017-05-22 ENCOUNTER — Encounter: Payer: Self-pay | Admitting: Internal Medicine

## 2017-05-22 LAB — PSA, TOTAL AND FREE
PSA, % Free: UNDETERMINED % (calc) (ref >25)
PSA, Free: <0.1 ng/mL

## 2017-05-23 NOTE — Telephone Encounter (Signed)
Him and his wife wanted to be on the wait list - not go to [facility] -- please put their names on the wait list.

## 2017-05-23 NOTE — Telephone Encounter (Signed)
Pt and wife are not yet on wait list.. I can add to waitlist or I can send to [facility]. Please advise patient when responding to message.

## 2017-06-01 ENCOUNTER — Other Ambulatory Visit: Payer: Self-pay | Admitting: Cardiovascular Disease

## 2017-06-02 NOTE — Telephone Encounter (Signed)
REFILL 

## 2017-06-09 ENCOUNTER — Other Ambulatory Visit: Payer: Self-pay | Admitting: *Deleted

## 2017-06-09 MED ORDER — ROSUVASTATIN CALCIUM 20 MG PO TABS: 20.0000 mg | ORAL_TABLET | Freq: Every day | ORAL | 0 refills | Status: DC

## 2017-07-09 ENCOUNTER — Encounter: Payer: Self-pay | Admitting: Internal Medicine

## 2017-07-11 MED ORDER — ZOSTER VAC RECOMB ADJUVANTED 50 MCG/0.5ML IM SUSR: 0.5000 mL | Freq: Once | INTRAMUSCULAR | 1 refills | Status: AC

## 2017-07-11 NOTE — Addendum Note (Signed)
Addended by: Terence Lux B on: 07/11/2017 07:44 AM   Modules accepted: Orders

## 2017-07-21 ENCOUNTER — Encounter: Payer: Self-pay | Admitting: Cardiovascular Disease

## 2017-07-21 ENCOUNTER — Ambulatory Visit: Payer: BLUE CROSS/BLUE SHIELD | Admitting: Cardiovascular Disease

## 2017-07-21 VITALS — BP 152/88 | HR 66 | Ht 72.0 in | Wt 215.2 lb

## 2017-07-21 DIAGNOSIS — I10 Essential (primary) hypertension: Secondary | ICD-10-CM

## 2017-07-21 DIAGNOSIS — Z8546 Personal history of malignant neoplasm of prostate: Secondary | ICD-10-CM

## 2017-07-21 DIAGNOSIS — Z9889 Other specified postprocedural states: Secondary | ICD-10-CM | POA: Diagnosis not present

## 2017-07-21 DIAGNOSIS — E785 Hyperlipidemia, unspecified: Secondary | ICD-10-CM | POA: Diagnosis not present

## 2017-07-21 MED ORDER — RAMIPRIL 5 MG PO CAPS: 5.0000 mg | ORAL_CAPSULE | Freq: Every day | ORAL | 3 refills | Status: DC

## 2017-07-21 MED ORDER — ROSUVASTATIN CALCIUM 20 MG PO TABS: 20.0000 mg | ORAL_TABLET | Freq: Every day | ORAL | 3 refills | Status: DC

## 2017-07-21 MED ORDER — METOPROLOL SUCCINATE ER 25 MG PO TB24: 12.5000 mg | ORAL_TABLET | Freq: Two times a day (BID) | ORAL | 3 refills | Status: DC

## 2017-07-21 NOTE — Patient Instructions (Signed)
Medication Instructions:  Your physician recommends that you continue on your current medications as directed. Please refer to the Current Medication list given to you today.  Testing/Procedures: Your physician has requested that you have an echocardiogram in November 2019. Echocardiography is a painless test that uses sound waves to create images of your heart. It provides your doctor with information about the size and shape of your heart and how well your heart's chambers and valves are working. This procedure takes approximately one hour. There are no restrictions for this procedure.  This will be done at our [facility] location:  [place]: Your physician wants you to follow-up in: November with Dr. Claiborne Billings (after echo). You will receive a reminder letter in the mail two months in advance. If you don't receive a letter, please call our office to schedule the follow-up appointment.   Any Other Special Instructions Will Be Listed Below (If Applicable).     If you need a refill on your cardiac medications before your next appointment, please call your pharmacy.

## 2017-07-21 NOTE — Progress Notes (Signed)
Patient ID: William Lewis, male   DOB: 04/21/1951, 67 y.o.   MRN: [id number]     HPI: William Lewis is a 67 y.o. male who presents to the office for a 14 month cardiology followup evaluation.  [name] underwent mitral valve repair robotically by Dr. Elveria Rising [facility] a 04/10/2006 for significant mitral valve prolapse with partial flail posterior P2 leaflet. He had 4+ preoperative mitral regurgitation and also had an ASD as well as a patent foramen ovale which were also successfully repaired. At the time of his surgery, his left atrial appendage was closed.  Preoperatively, he was documented to have normal coronary arteries by cardiac catheterization. Last year he underwent a 18 month followup echo Doppler study which continued to show excellent result. Left ventricular ejection fraction was 60-65% and he did not have regional wall motion abnormalities. His mitral annular ring prosthesis was present. There is no mention in his current echo of any residual mitral regurgitation whereas on his prior echo in 2013 he was felt to have mild MR. All other valvular anatomy was normal.  He has a remote history of prostate CA which developed at age 42 at which time he underwent surgery.  When I saw him in 2016  he was continuing to be active and had a good year.,he was playing teis regularly and riding his bike. He had lost his brother and is mother which had led to some increased stress. He continues to do well.  He is still working at least 40-50 hours per week.  He is biking less, but playing tennis and golf without cardiovascular restrictions. He is unaware of any palpitations. He denies any chest pain.  He denies presyncope or syncope.  In June 2017 he presented to the emergency room after a piece of chicken had gotten stuck in his esophagus.  He required IV glucagon and subsequently underwent endoscopy by Dr. Earlean Shawl which reportedly was unremarkable.   He underwent a follow-up echo Doppler  study on 04/22/2016.  Ejection fraction is 60-65%.  Mild LVHwithout wall motion abnormalities.  There was grade 1 diastolic dysfunction.  He is a setting aorta was minimally icreased at 41 mm.  His mitral annulus ring/repair was intact.  There was no mention of mitral regurgion.  There was mild biatrial enlargement.  He had normal pulmonary pressures.  Since I last saw him in December 2017, he has continued to be asymptomatic.  Specifically, he exercises regularly.  He right his bike and plays tennis and 3 groups and averages play tennis 3 days per week.  He denies any shortness of breath, PND, orthopnea.  He denies any awareness of arrhythmia.  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  Since Dr. Linna Darner retired, he establish primary care with Dr. Celso Amy.  Laboratory in December 2018 revealed total cholesterol 151, HDL 59, LDL 73, triglycerides 92.  Renal function was stable with a creatinine of 1.07.  LFTs were normal.  TSH was 3.6.  He presents for evaluation.  Past Medical History:  Diagnosis Date  . ASD (atrial septal defect) 2007   repaired with PFO in [place]  . Echocardiogram abnormal 2013   EF 50-09%, normal diastolic parameters, mild mitral regur. with his post leaflet of MV calcified  . History of stress test 2008   neg.for ischemia  . HTN (hypertension)   . MVP (mitral valve prolapse) 2007   mitral  valve repair in [place]  . Other and unspecified hyperlipidemia    Dr Ellouise Newer    Past Surgical History:  Procedure Laterality Date  . CARDIAC CATHETERIZATION  2007   EF 65-70%, significant mitral prolapse wth 4+ MR, normal coronary arteries   . COLONOSCOPY  2005 ; 2010;2014   polyp 2010 , Dr Earlean Shawl  . HERNIA REPAIR  2007  . I&D EXTREMITY     thumb, for infection  . MITRAL VALVE REPAIR  2007   Robotic surgery @ [address] [place] , [place] and ASD repair and PFO repair  . PROSTATE SURGERY  1995    TURP , Dr Serita Butcher    No Known Allergies  Current Outpatient Medications  Medication Sig Dispense Refill  . aspirin 81 MG tablet Take 81 mg by mouth daily.      . Cholecalciferol (VITAMIN D3) 2000 UNITS TABS Take by mouth daily.    . fish oil-omega-3 fatty acids 1000 MG capsule Take by mouth daily.    . metoprolol succinate (TOPROL-XL) 25 MG 24 hr tablet Take 0.5 tablets (12.5 mg total) by mouth 2 (two) times daily. 90 tablet 3  . ramipril (ALTACE) 5 MG capsule Take 1 capsule (5 mg total) by mouth daily. 90 capsule 3  . rosuvastatin (CRESTOR) 20 MG tablet Take 1 tablet (20 mg total) by mouth at bedtime. 90 tablet 3   No current facility-administered medications for this visit.     Social History   Socioeconomic History  . Marital status: Married    Spouse name: Not on file  . Number of children: Not on file  . Years of education: Not on file  . Highest education level: Not on file  Social Needs  . Financial resource strain: Not on file  . Food insecurity - worry: Not on file  . Food insecurity - inability: Not on file  . Transportation needs - medical: Not on file  . Transportation needs - non-medical: Not on file  Occupational History  . Not on file  Tobacco Use  . Smoking status: Former Smoker    Last attempt to quit: 06/10/1977    Years since quitting: 40.1  . Smokeless tobacco: Never Used  . Tobacco comment: from age [age], up to 1 ppd or less  Substance and Sexual Activity  . Alcohol use: Yes    Alcohol/week: 8.4 oz    Types: 14 Glasses of wine per week  . Drug use: No  . Sexual activity: Not on file  Other Topics Concern  . Not on file  Social History Narrative   Gets reg exercise    Family History  Problem Relation Age of Onset  . Hypertension Father   . Cancer Father        throat ; smoker  . Prostate cancer Father   . Stroke Paternal Grandmother        late 6s  . AAA (abdominal aortic aneurysm) Maternal Grandmother   . Cancer Mother 39       ?  intra-abdominal  . Heart disease Neg Hx   . Diabetes Neg Hx    Social history is notable that he is married he has 2 children. He does exercise. Is no tobacco or alcohol use.  His daughter is an attorney in [place] involved with healthcare and a large firm doing exceptionally well.  His son is working in [place].  He is expecting his first grandchild April 2019   ROS General: Negative; No fevers, chills, or night sweats;  HEENT: Negative; No changes in vision or hearing, sinus congestion, difficulty swallowing Pulmonary: Negative; No cough, wheezing, shortness of breath, hemoptysis Cardiovascular: Negative; No chest pain, presyncope, syncope, palpitations GI: Negative; No nausea, vomiting, diarrhea, or abdominal pain GU: Negative; No dysuria, hematuria, or difficulty voiding Musculoskeletal: Negative; no myalgias, joint pain, or weakness Hematologic/Oncology: Negative; no easy bruising, bleeding Endocrine: Negative; no heat/cold intolerance; no diabetes Neuro: Negative; no changes in balance, headaches Skin: Negative; No rashes or skin lesions Psychiatric: Negative; No behavioral problems, depression Sleep: Negative; No snoring, daytime sleepiness, hypersomnolence, bruxism, restless legs, hypnogognic hallucinations, no cataplexy Other comprehensive 14 point system review is negative.  PE BP (!) 152/88   Pulse 66   Ht 6' (1.829 m)   Wt 215 lb 3.2 oz (97.6 kg)   BMI 29.19 kg/m    Repeat blood pressure by me was 122/70 supine.  Standing blood pressure was 128/76.  Wt Readings from Last 3 Encounters:  07/21/17 215 lb 3.2 oz (97.6 kg)  05/21/17 217 lb (98.4 kg)  06/28/16 214 lb (97.1 kg)   General: Alert, oriented, no distress.  Skin: normal turgor, no rashes, warm and dry HEENT: Normocephalic, atraumatic. Pupils equal round and reactive to light; sclera anicteric; extraocular muscles intact;  Nose without nasal septal hypertrophy Mouth/Parynx benign; Mallinpatti scale Neck:  No JVD, no carotid bruits; normal carotid upstroke Lungs: clear to ausculatation and percussion; no wheezing or rales Chest wall: without tenderness to palpitation Heart: PMI not displaced, RRR, s1 s2 normal, I did not hear any residual murmur of MR., no diastolic murmur, no rubs, gallops, thrills, or heaves Abdomen: soft, nontender; no hepatosplenomehaly, BS+; abdominal aorta nontender and not dilated by palpation. Back: no CVA tenderness Pulses 2+ Musculoskeletal: full range of motion, normal strength, no joint deformities Extremities: no clubbing cyanosis or edema, Homan's sign negative  Neurologic: grossly nonfocal; Cranial nerves grossly wnl Psychologic: Normal mood and affect  ECG (independently read by me): Sinus rhythm at 66 bpm with PAC.  Borderline first-degree block with a PR interval of 22 ms  December 2017 ECG (independently read by me): sinus bradycardia 59 bpm with a PAC.  Normal intervals.  NST segment changes.  November 2016 ECG (independently read by me): Normal sinus rhythm with an isolated PAC.  Normal intervals.  No ST segment changes.  November 2015 ECG (independently read by me): NSR at 57; PR interval 220 ms.  QTc interval normal  Prior November 2014 ECG: Sinus at 71 beats per minute. No significant change the  LABS:  BMP Latest Ref Rng & Units 05/21/2017 05/15/2016 12/08/2015  Glucose 70 - 99 mg/dL 95 94 96  BUN 6 - 23 mg/dL _0 Creatinine 0.40 - 1.50 mg/dL 1.07 1.07 1.10  Sodium 135 - 145 mEq/L 138 139 140  Potassium 3.5 - 5.1 mEq/L 4.4 5.3 3.8  Chloride 96 - 112 mEq/L 101 105 103  CO2 19 - 32 mEq/L 27 24 -  Calcium 8.4 - 10.5 mg/dL 9.3 9.4 -   Hepatic Function Latest Ref Rng & Units 05/21/2017 05/15/2016 05/03/2015  Total Protein 6.0 - 8.3 g/dL 7.3 6.8 6.7  Albumin 3.5 - 5.2 g/dL 4.5 4.4 4.2  AST 0 - 37 U/L _1 ALT 0 - 53 U/L _2 Alk Phosphatase 39 - 117 U/L 50 55 52  Total Bilirubin 0.2 - 1.2 mg/dL 0.6 0.5 0.5  Bilirubin, Direct 0.0  - 0.3 mg/dL - - -   CBC Latest Ref Rng &  Units 05/21/2017 05/15/2016 12/08/2015  WBC 4.0 - 10.5 K/uL 7.2 5.4 -  Hemoglobin 13.0 - 17.0 g/dL 15.0 14.6 15.3  Hematocrit 39.0 - 52.0 % 46.3 45.4 45.0  Platelets 150.0 - 400.0 K/uL 244.0 255 -   Lab Results  Component Value Date   MCV 90.8 05/21/2017   MCV 88.7 05/15/2016   MCV 87.8 12/08/2015   Lab Results  Component Value Date   TSH 3.67 05/21/2017  No results found for: HGBA1C   Lab Results  Component Value Date   PSA 0.1 05/15/2016   PSA <0.01 05/03/2015   PSA <0.01 04/25/2014   Lipid Panel     Component Value Date/Time   CHOL 151 05/21/2017 1056   TRIG 92.0 05/21/2017 1056   HDL 59.50 05/21/2017 1056   CHOLHDL 3 05/21/2017 1056   VLDL 18.4 05/21/2017 1056   LDLCALC 73 05/21/2017 1056   RADIOLOGY: No results found.  IMPRESSION:  1. S/P Robotic MVR (mitral valve repair),2007 along with ASD/PFO repair   2. Essential hypertension   3. Hyperlipidemia, unspecified hyperlipidemia type   4. History of prostate cancer    ASSESSMENT AND PLAN: Mr. [place] is a 67 year old Caucasian male who is status post [place] robotic mitral valve repair surgery which was performed on 04/10/2006 for severe MR due to a ruptured chordae which time he had placement of 4 new Gore-Tex chordae, 2 from the medial, 2 from the lateral muscle which were brought to the edges of the posterior leaflet. He also had repair of a small atrial septal defect as well as a patent foramen ovale. Preoperatively he had normal coronary arteries. He is without anginal symptomatology.  His last echo Doppler study in November 2017 remains stable.  Mild LVH with normal systolic function with an EF of 60-65%.  There was mild grade 1 diastolic dysfunction.  A sending aorta was minimally enlarged at 41 mm.  His mitral annulus ring/repair was intact.  There was no mitral regurgitation.  He had mild biatrial enlargement.  There was no residual PFO or ASD.  He has continued to be  asymptomatic.  He exercises regularly.  He rides his bike long distances and place tennis 3 days per week.  Physical examination today is unchanged.  I do not detect any residual MR.  I reviewed laboratory from his primary physician in December 2018.  Renal function remained stable.  He continues to be on ramipril 5 mg in addition to Toprol-XL which he is taking 12.5 mg twice a day.  His blood pressure today was initially elevated.  Immediately upon arrival, but this normalized while he was in the office.  He continues to be on rosuvastatin 20 mg for hyperlipidemia, and most recent LDL was 73.  He had normal coronary arteries at cardiac catheterization prior to his surgery.  Since he is remaining asymptomatic.  He will continue current therapy as prescribed.  He'll monitor his blood pressure.  I will not recheck an echo.  Presently, but I recommended in November 2019 he undergo a 2 year follow-up echo Doppler assessment.  I will see him in the office for follow-up evaluation.  Time spent: 25 minutes Troy Sine, MD, [facility]  07/21/2017 9:29 AM

## 2017-08-14 MED FILL — SHINGRIX 50 MCG SUS: 50 | 30 days supply | Qty: 1 | Fill #0

## 2017-10-14 MED FILL — SHINGRIX 50 MCG SUS: 50 | 30 days supply | Qty: 1 | Fill #1

## 2017-10-17 DIAGNOSIS — K222 Esophageal obstruction: Secondary | ICD-10-CM | POA: Insufficient documentation

## 2017-10-29 ENCOUNTER — Encounter: Payer: Self-pay | Admitting: Internal Medicine

## 2017-11-17 LAB — HM COLONOSCOPY

## 2017-11-19 ENCOUNTER — Encounter: Payer: Self-pay | Admitting: Internal Medicine

## 2018-02-18 ENCOUNTER — Encounter: Payer: Self-pay | Admitting: Internal Medicine

## 2018-04-01 DIAGNOSIS — K648 Other hemorrhoids: Secondary | ICD-10-CM | POA: Insufficient documentation

## 2018-04-20 ENCOUNTER — Other Ambulatory Visit: Payer: Self-pay

## 2018-04-20 ENCOUNTER — Ambulatory Visit (HOSPITAL_COMMUNITY): Payer: BLUE CROSS/BLUE SHIELD | Attending: Cardiovascular Disease

## 2018-04-20 DIAGNOSIS — Z9889 Other specified postprocedural states: Secondary | ICD-10-CM | POA: Diagnosis present

## 2018-05-23 NOTE — Progress Notes (Signed)
Subjective:    Patient ID: William Lewis, male    DOB: 1950-08-28, 67 y.o.   MRN: [id number]  HPI He is here for a physical exam.    He denies any changes in his health since last year.  He has no concerns.    Medications and allergies reviewed with patient and updated if appropriate.  Patient Active Problem List   Diagnosis Date Noted  . Left leg pain 05/20/2016  . S/P MVR (mitral valve repair),2007 along with ASD repair 04/26/2013  . Hx of colonic polyps 04/14/2013  . Vitamin D deficiency 03/30/2012  . Essential hypertension 06/29/2010  . Hyperlipidemia 10/21/2007  . MITRAL REGURGITATION 10/21/2007  . HX, PERSONAL, MALIGNANCY, PROSTATE 10/23/2006    Current Outpatient Medications on File Prior to Visit  Medication Sig Dispense Refill  . Cholecalciferol (VITAMIN D3) 2000 UNITS TABS Take by mouth daily.    . fish oil-omega-3 fatty acids 1000 MG capsule Take by mouth daily.    . metoprolol succinate (TOPROL-XL) 25 MG 24 hr tablet Take 0.5 tablets (12.5 mg total) by mouth 2 (two) times daily. 90 tablet 3  . ramipril (ALTACE) 5 MG capsule Take 1 capsule (5 mg total) by mouth daily. 90 capsule 3  . rosuvastatin (CRESTOR) 20 MG tablet Take 1 tablet (20 mg total) by mouth at bedtime. 90 tablet 3  . aspirin 81 MG tablet Take 81 mg by mouth daily.       No current facility-administered medications on file prior to visit.     Past Medical History:  Diagnosis Date  . ASD (atrial septal defect) 2007   repaired with PFO in [place]  . Echocardiogram abnormal 2013   EF 23-76%, normal diastolic parameters, mild mitral regur. with his post leaflet of MV calcified  . History of stress test 2008   neg.for ischemia  . HTN (hypertension)   . MVP (mitral valve prolapse) 2007   mitral valve repair in [place]  . Other and unspecified hyperlipidemia    Dr Ellouise Newer    Past Surgical History:  Procedure Laterality Date  . CARDIAC CATHETERIZATION  2007   EF 65-70%, significant mitral  prolapse wth 4+ MR, normal coronary arteries   . COLONOSCOPY  2005 ; 2010;2014   polyp 2010 , Dr Earlean Shawl  . HERNIA REPAIR  2007  . I&D EXTREMITY     thumb, for infection  . MITRAL VALVE REPAIR  2007   Robotic surgery @ [address] [place] , [place] and ASD repair and PFO repair  . PROSTATE SURGERY  1995   TURP , Dr Serita Butcher    Social History   Socioeconomic History  . Marital status: Married    Spouse name: Not on file  . Number of children: Not on file  . Years of education: Not on file  . Highest education level: Not on file  Occupational History  . Not on file  Social Needs  . Financial resource strain: Not on file  . Food insecurity:    Worry: Not on file    Inability: Not on file  . Transportation needs:    Medical: Not on file    Non-medical: Not on file  Tobacco Use  . Smoking status: Former Smoker    Last attempt to quit: 06/10/1977    Years since quitting: 40.9  . Smokeless tobacco: Never Used  . Tobacco comment: from age [age], up to 1 ppd or less  Substance and Sexual Activity  . Alcohol use: Yes  Alcohol/week: 14.0 standard drinks    Types: 14 Glasses of wine per week  . Drug use: No  . Sexual activity: Not on file  Lifestyle  . Physical activity:    Days per week: Not on file    Minutes per session: Not on file  . Stress: Not on file  Relationships  . Social connections:    Talks on phone: Not on file    Gets together: Not on file    Attends religious service: Not on file    Active member of club or organization: Not on file    Attends meetings of clubs or organizations: Not on file    Relationship status: Not on file  Other Topics Concern  . Not on file  Social History Narrative   Gets reg exercise    Family History  Problem Relation Age of Onset  . Hypertension Father   . Cancer Father        throat ; smoker  . Prostate cancer Father   . Stroke Paternal Grandmother        late 37s  . AAA (abdominal aortic aneurysm) Maternal Grandmother   .  Cancer Mother 49       ? intra-abdominal  . Heart disease Neg Hx   . Diabetes Neg Hx     Review of Systems  Constitutional: Negative for chills and fever.  Eyes: Negative for visual disturbance.  Respiratory: Negative for cough, shortness of breath and wheezing.   Cardiovascular: Negative for chest pain and leg swelling.  Gastrointestinal: Negative for abdominal pain, blood in stool, constipation, diarrhea and nausea.       No gerd  Genitourinary: Negative for dysuria and hematuria.  Musculoskeletal: Negative for arthralgias and back pain.  Skin: Negative for color change and rash.  Neurological: Negative for dizziness, light-headedness, numbness and headaches.  Psychiatric/Behavioral: Negative for dysphoric mood. The patient is not nervous/anxious.        Objective:   Vitals:   05/25/18 0826  BP: 118/80  Pulse: 68  Resp: 16  Temp: 98.7 F (37.1 C)  SpO2: 94%   Filed Weights   05/25/18 0826  Weight: 226 lb 12.8 oz (102.9 kg)   Body mass index is 30.76 kg/m.  Wt Readings from Last 3 Encounters:  05/25/18 226 lb 12.8 oz (102.9 kg)  07/21/17 215 lb 3.2 oz (97.6 kg)  05/21/17 217 lb (98.4 kg)     Physical Exam Constitutional: He appears well-developed and well-nourished. No distress.  HENT:  Head: Normocephalic and atraumatic.  Right Ear: External ear normal.  Left Ear: External ear normal.  Mouth/Throat: Oropharynx is clear and moist.  Normal ear canals and TM b/l  Eyes: Conjunctivae and EOM are normal.  Neck: Neck supple. No tracheal deviation present. No thyromegaly present. No carotid bruit  Cardiovascular: Normal rate, regular rhythm, normal heart sounds and intact distal pulses.   No murmur heard. Pulmonary/Chest: Effort normal and breath sounds normal. No respiratory distress. He has no wheezes. He has no rales.  Abdominal: Soft. He exhibits no distension. There is no tenderness.  Genitourinary: deferred  Musculoskeletal: He exhibits no edema.    Lymphadenopathy:   He has no cervical adenopathy.  Skin: Skin is warm and dry. He is not diaphoretic.  Psychiatric: He has a normal mood and affect. His behavior is normal.         Assessment & Plan:   Physical exam: Screening blood work   ordered Immunizations   All up to date  Colonoscopy    Up to date  Eye exams  Up to date  EKG    Done 07/2017 Exercise  Regular- walks dog, bikes, tennis Weight     Overweight  Skin   No concerns Substance abuse  none  See Problem List for Assessment and Plan of chronic medical problems.   FU in one year

## 2018-05-23 NOTE — Patient Instructions (Addendum)
Tests ordered today. Your results will be released to [facility] (or called to you) after review, usually within 72hours after test completion. If any changes need to be made, you will be notified at that same time.  All other Health Maintenance issues reviewed.   All recommended immunizations and age-appropriate screenings are up-to-date or discussed.  No immunizations administered today.   Medications reviewed and updated.  Changes include :   none    Please followup in one year    Health Maintenance, Male A healthy lifestyle and preventive care is important for your health and wellness. Ask your health care provider about what schedule of regular examinations is right for you. What should I know about weight and diet? Eat a Healthy Diet  Eat plenty of vegetables, fruits, whole grains, low-fat dairy products, and lean protein.  Do not eat a lot of foods high in solid fats, added sugars, or salt.  Maintain a Healthy Weight Regular exercise can help you achieve or maintain a healthy weight. You should:  Do at least 150 minutes of exercise each week. The exercise should increase your heart rate and make you sweat (moderate-intensity exercise).  Do strength-training exercises at least twice a week.  Watch Your Levels of Cholesterol and Blood Lipids  Have your blood tested for lipids and cholesterol every 5 years starting at 69 years of age. If you are at high risk for heart disease, you should start having your blood tested when you are 69 years old. You may need to have your cholesterol levels checked more often if: ? Your lipid or cholesterol levels are high. ? You are older than 69 years of age. ? You are at high risk for heart disease.  What should I know about cancer screening? Many types of cancers can be detected early and may often be prevented. Lung Cancer  You should be screened every year for lung cancer if: ? You are a current smoker who has smoked for at least 30  years. ? You are a former smoker who has quit within the past 15 years.  Talk to your health care provider about your screening options, when you should start screening, and how often you should be screened.  Colorectal Cancer  Routine colorectal cancer screening usually begins at 69 years of age and should be repeated every 5-10 years until you are 69 years old. You may need to be screened more often if early forms of precancerous polyps or small growths are found. Your health care provider may recommend screening at an earlier age if you have risk factors for colon cancer.  Your health care provider may recommend using home test kits to check for hidden blood in the stool.  A small camera at the end of a tube can be used to examine your colon (sigmoidoscopy or colonoscopy). This checks for the earliest forms of colorectal cancer.  Prostate and Testicular Cancer  Depending on your age and overall health, your health care provider may do certain tests to screen for prostate and testicular cancer.  Talk to your health care provider about any symptoms or concerns you have about testicular or prostate cancer.  Skin Cancer  Check your skin from head to toe regularly.  Tell your health care provider about any new moles or changes in moles, especially if: ? There is a change in a mole's size, shape, or color. ? You have a mole that is larger than a pencil eraser.  Always use sunscreen. Apply sunscreen  liberally and repeat throughout the day.  Protect yourself by wearing long sleeves, pants, a wide-brimmed hat, and sunglasses when outside.  What should I know about heart disease, diabetes, and high blood pressure?  If you are 87-38 years of age, have your blood pressure checked every 3-5 years. If you are 89 years of age or older, have your blood pressure checked every year. You should have your blood pressure measured twice-once when you are at a hospital or clinic, and once when you are  not at a hospital or clinic. Record the average of the two measurements. To check your blood pressure when you are not at a hospital or clinic, you can use: ? An automated blood pressure machine at a pharmacy. ? A home blood pressure monitor.  Talk to your health care provider about your target blood pressure.  If you are between 11-56 years old, ask your health care provider if you should take aspirin to prevent heart disease.  Have regular diabetes screenings by checking your fasting blood sugar level. ? If you are at a normal weight and have a low risk for diabetes, have this test once every three years after the age of 71. ? If you are overweight and have a high risk for diabetes, consider being tested at a younger age or more often.  A one-time screening for abdominal aortic aneurysm (AAA) by ultrasound is recommended for men aged 55-75 years who are current or former smokers. What should I know about preventing infection? Hepatitis B If you have a higher risk for hepatitis B, you should be screened for this virus. Talk with your health care provider to find out if you are at risk for hepatitis B infection. Hepatitis C Blood testing is recommended for:  Everyone born from [birth year] through 1967.  Anyone with known risk factors for hepatitis C.  Sexually Transmitted Diseases (STDs)  You should be screened each year for STDs including gonorrhea and chlamydia if: ? You are sexually active and are younger than 69 years of age. ? You are older than 69 years of age and your health care provider tells you that you are at risk for this type of infection. ? Your sexual activity has changed since you were last screened and you are at an increased risk for chlamydia or gonorrhea. Ask your health care provider if you are at risk.  Talk with your health care provider about whether you are at high risk of being infected with HIV. Your health care provider may recommend a prescription medicine to help  prevent HIV infection.  What else can I do?  Schedule regular health, dental, and eye exams.  Stay current with your vaccines (immunizations).  Do not use any tobacco products, such as cigarettes, chewing tobacco, and e-cigarettes. If you need help quitting, ask your health care provider.  Limit alcohol intake to no more than 2 drinks per day. One drink equals 12 ounces of beer, 5 ounces of wine, or 1 ounces of hard liquor.  Do not use street drugs.  Do not share needles.  Ask your health care provider for help if you need support or information about quitting drugs.  Tell your health care provider if you often feel depressed.  Tell your health care provider if you have ever been abused or do not feel safe at home. This information is not intended to replace advice given to you by your health care provider. Make sure you discuss any questions you have with your  health care provider. Document Released: 11/23/2007 Document Revised: 01/24/2016 Document Reviewed: 02/28/2015 Elsevier Interactive Patient Education  [employer].

## 2018-05-25 ENCOUNTER — Encounter: Payer: Self-pay | Admitting: Internal Medicine

## 2018-05-25 ENCOUNTER — Ambulatory Visit (INDEPENDENT_AMBULATORY_CARE_PROVIDER_SITE_OTHER): Payer: BLUE CROSS/BLUE SHIELD | Admitting: Internal Medicine

## 2018-05-25 ENCOUNTER — Other Ambulatory Visit (INDEPENDENT_AMBULATORY_CARE_PROVIDER_SITE_OTHER): Payer: BLUE CROSS/BLUE SHIELD

## 2018-05-25 VITALS — BP 118/80 | HR 68 | Temp 98.7°F | Resp 16 | Ht 72.0 in | Wt 216.0 lb

## 2018-05-25 DIAGNOSIS — Z8546 Personal history of malignant neoplasm of prostate: Secondary | ICD-10-CM

## 2018-05-25 DIAGNOSIS — Z0000 Encounter for general adult medical examination without abnormal findings: Secondary | ICD-10-CM | POA: Diagnosis not present

## 2018-05-25 DIAGNOSIS — E782 Mixed hyperlipidemia: Secondary | ICD-10-CM

## 2018-05-25 DIAGNOSIS — I10 Essential (primary) hypertension: Secondary | ICD-10-CM

## 2018-05-25 DIAGNOSIS — K222 Esophageal obstruction: Secondary | ICD-10-CM

## 2018-05-25 DIAGNOSIS — E559 Vitamin D deficiency, unspecified: Secondary | ICD-10-CM

## 2018-05-25 LAB — CBC WITH DIFFERENTIAL/PLATELET
Basophils Absolute: 0 10*3/uL (ref 0.0–0.1)
Basophils Relative: 0.8 % (ref 0.0–3.0)
Eosinophils Absolute: 0.2 10*3/uL (ref 0.0–0.7)
Eosinophils Relative: 3.1 % (ref 0.0–5.0)
HCT: 44.9 % (ref 39.0–52.0)
Hemoglobin: 14.7 g/dL (ref 13.0–17.0)
LYMPHS ABS: 1.3 10*3/uL (ref 0.7–4.0)
Lymphocytes Relative: 24.7 % (ref 12.0–46.0)
MCHC: 32.8 g/dL (ref 30.0–36.0)
MCV: 88.7 fl (ref 78.0–100.0)
Monocytes Absolute: 0.6 10*3/uL (ref 0.1–1.0)
Monocytes Relative: 10.9 % (ref 3.0–12.0)
NEUTROS PCT: 60.5 % (ref 43.0–77.0)
Neutro Abs: 3.1 10*3/uL (ref 1.4–7.7)
Platelets: 233 10*3/uL (ref 150.0–400.0)
RBC: 5.06 Mil/uL (ref 4.22–5.81)
RDW: 14.1 % (ref 11.5–15.5)
WBC: 5.1 10*3/uL (ref 4.0–10.5)

## 2018-05-25 LAB — LIPID PANEL
Cholesterol: 165 mg/dL (ref 0–200)
HDL: 58.6 mg/dL (ref >39.00)
LDL Cholesterol: 81 mg/dL (ref 0–99)
NONHDL: 106.7
Total CHOL/HDL Ratio: 3
Triglycerides: 128 mg/dL (ref 0.0–149.0)
VLDL: 25.6 mg/dL (ref 0.0–40.0)

## 2018-05-25 LAB — COMPREHENSIVE METABOLIC PANEL
ALK PHOS: 42 U/L (ref 39–117)
ALT: 15 U/L (ref 0–53)
AST: 18 U/L (ref 0–37)
Albumin: 4.5 g/dL (ref 3.5–5.2)
BUN: 15 mg/dL (ref 6–23)
CO2: 26 mEq/L (ref 19–32)
Calcium: 9.5 mg/dL (ref 8.4–10.5)
Chloride: 104 mEq/L (ref 96–112)
Creatinine, Ser: 0.99 mg/dL (ref 0.40–1.50)
GFR: 79.63 mL/min (ref >60.00)
GLUCOSE: 90 mg/dL (ref 70–99)
Potassium: 4.4 mEq/L (ref 3.5–5.1)
Sodium: 140 mEq/L (ref 135–145)
Total Bilirubin: 0.5 mg/dL (ref 0.2–1.2)
Total Protein: 7.1 g/dL (ref 6.0–8.3)

## 2018-05-25 LAB — TSH: TSH: 3.13 u[IU]/mL (ref 0.35–4.50)

## 2018-05-25 NOTE — Assessment & Plan Note (Signed)
Taking vitamin d daily 

## 2018-05-25 NOTE — Assessment & Plan Note (Signed)
Check lipid panel  Continue daily statin Regular exercise and healthy diet encouraged  

## 2018-05-25 NOTE — Assessment & Plan Note (Signed)
No signs of recurrence Check psa

## 2018-05-25 NOTE — Assessment & Plan Note (Signed)
Had one choking episode - follows with Dr Earlean Shawl He is very careful with chewing his food thoroughly

## 2018-05-25 NOTE — Assessment & Plan Note (Signed)
BP well controlled Current regimen effective and well tolerated Continue current medications at current doses cmp  

## 2018-05-26 ENCOUNTER — Encounter: Payer: Self-pay | Admitting: Internal Medicine

## 2018-05-26 LAB — PSA, TOTAL AND FREE
PSA, % Free: UNDETERMINED % (calc) (ref >25)
PSA, Total: <0.1 ng/mL (ref <=4.0)

## 2018-05-27 ENCOUNTER — Ambulatory Visit: Payer: BLUE CROSS/BLUE SHIELD | Admitting: Cardiovascular Disease

## 2018-05-27 ENCOUNTER — Encounter: Payer: Self-pay | Admitting: Cardiovascular Disease

## 2018-05-27 VITALS — BP 144/76 | HR 52 | Ht 72.0 in | Wt 218.6 lb

## 2018-05-27 DIAGNOSIS — I10 Essential (primary) hypertension: Secondary | ICD-10-CM

## 2018-05-27 DIAGNOSIS — Z9889 Other specified postprocedural states: Secondary | ICD-10-CM | POA: Diagnosis not present

## 2018-05-27 DIAGNOSIS — E785 Hyperlipidemia, unspecified: Secondary | ICD-10-CM | POA: Diagnosis not present

## 2018-05-27 DIAGNOSIS — Z8546 Personal history of malignant neoplasm of prostate: Secondary | ICD-10-CM | POA: Diagnosis not present

## 2018-05-27 NOTE — Progress Notes (Signed)
Patient ID: William Lewis, male   DOB: Apr 08, 1951, 67 y.o.   MRN: [id number]     HPI: William Lewis is a 67 y.o. male who presents to the office for a 10 month cardiology followup evaluation.  [name] underwent mitral valve repair robotically by Dr. Elveria Rising [facility] a 04/10/2006 for significant mitral valve prolapse with partial flail posterior P2 leaflet. He had 4+ preoperative mitral regurgitation and also had an ASD as well as a patent foramen ovale which were also successfully repaired. At the time of his surgery, his left atrial appendage was closed.  Preoperatively, he was documented to have normal coronary arteries by cardiac catheterization. Last year he underwent a 18 month followup echo Doppler study which continued to show excellent result. Left ventricular ejection fraction was 60-65% and he did not have regional wall motion abnormalities. His mitral annular ring prosthesis was present. There is no mention in his current echo of any residual mitral regurgitation whereas on his prior echo in 2013 he was felt to have mild MR. All other valvular anatomy was normal.  He has a remote history of prostate CA which developed at age 45 at which time he underwent surgery.  When I saw him in 2016  he was continuing to be active and had a good year.,he was playing teis regularly and riding his bike. He had lost his brother and is mother which had led to some increased stress. He continues to do well.  He is still working at least 40-50 hours per week.  He is biking less, but playing tennis and golf without cardiovascular restrictions. He is unaware of any palpitations. He denies any chest pain.  He denies presyncope or syncope.  In June 2017 he presented to the emergency room after a piece of chicken had gotten stuck in his esophagus.  He required IV glucagon and subsequently underwent endoscopy by Dr. Earlean Shawl which reportedly was unremarkable.   He underwent a follow-up echo Doppler  study on 04/22/2016.  Ejection fraction is 60-65%.  Mild LVHwithout wall motion abnormalities.  There was grade 1 diastolic dysfunction.  He is a setting aorta was minimally icreased at 41 mm.  His mitral annulus ring/repair was intact.  There was no mention of mitral regurgion.  There was mild biatrial enlargement.  He had normal pulmonary pressures.  I had seen him in follow-up in December 2017.  Since I last saw him in February 2019, his company has been bought out.  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  He continues to exercise.  He has not been biking as much as he had in the past.  He underwent a follow-up echo Doppler study on April 20, 2018 which showed an EF of 60 to 65%.  He had normal wall motion.  His mitral valve repair was stable.  There was no significant residual mitral regurgitation.  Left atrium was mildly dilated.  He denies any chest pain.  He is unaware of any episodes of atrial fibrillation or palpitations.  He had laboratory 2 days ago.  TSH was normal.  Total cholesterol 165, triglycerides 128, HDL 58, LDL cholesterol 81.  Electrolytes were normal.  Renal function and LFTs were normal.  He presents for reevaluation.  Past Medical History:  Diagnosis Date  . ASD (atrial septal defect) 2007   repaired with PFO in [place]  . Echocardiogram abnormal 2013   EF 40-98%, normal diastolic parameters, mild  mitral regur. with his post leaflet of MV calcified  . History of stress test 2008   neg.for ischemia  . HTN (hypertension)   . MVP (mitral valve prolapse) 2007   mitral valve repair in [place]  . Other and unspecified hyperlipidemia    Dr Ellouise Newer    Past Surgical History:  Procedure Laterality Date  . CARDIAC CATHETERIZATION  2007   EF 65-70%, significant mitral prolapse wth 4+ MR, normal coronary arteries   . COLONOSCOPY  2005 ; 2010;2014   polyp 2010 , Dr Earlean Shawl  . HERNIA REPAIR  2007  . I&D EXTREMITY      thumb, for infection  . MITRAL VALVE REPAIR  2007   Robotic surgery @ [address] [place] , [place] and ASD repair and PFO repair  . PROSTATE SURGERY  1995   TURP , Dr Serita Butcher    No Known Allergies  Current Outpatient Medications  Medication Sig Dispense Refill  . aspirin 81 MG tablet Take 81 mg by mouth daily.      . Cholecalciferol (VITAMIN D3) 2000 UNITS TABS Take by mouth daily.    . fish oil-omega-3 fatty acids 1000 MG capsule Take by mouth daily.    . metoprolol succinate (TOPROL-XL) 25 MG 24 hr tablet Take 0.5 tablets (12.5 mg total) by mouth 2 (two) times daily. 90 tablet 3  . ramipril (ALTACE) 5 MG capsule Take 1 capsule (5 mg total) by mouth daily. 90 capsule 3  . rosuvastatin (CRESTOR) 20 MG tablet Take 1 tablet (20 mg total) by mouth at bedtime. 90 tablet 3   No current facility-administered medications for this visit.     Social History   Socioeconomic History  . Marital status: Married    Spouse name: Not on file  . Number of children: Not on file  . Years of education: Not on file  . Highest education level: Not on file  Occupational History  . Not on file  Social Needs  . Financial resource strain: Not on file  . Food insecurity:    Worry: Not on file    Inability: Not on file  . Transportation needs:    Medical: Not on file    Non-medical: Not on file  Tobacco Use  . Smoking status: Former Smoker    Last attempt to quit: 06/10/1977    Years since quitting: 40.9  . Smokeless tobacco: Never Used  . Tobacco comment: from age [age], up to 1 ppd or less  Substance and Sexual Activity  . Alcohol use: Yes    Alcohol/week: 14.0 standard drinks    Types: 14 Glasses of wine per week  . Drug use: No  . Sexual activity: Not on file  Lifestyle  . Physical activity:    Days per week: Not on file    Minutes per session: Not on file  . Stress: Not on file  Relationships  . Social connections:    Talks on phone: Not on file    Gets together: Not on file    Attends  religious service: Not on file    Active member of club or organization: Not on file    Attends meetings of clubs or organizations: Not on file    Relationship status: Not on file  . Intimate partner violence:    Fear of current or ex partner: Not on file    Emotionally abused: Not on file    Physically abused: Not on file    Forced sexual activity: Not on  file  Other Topics Concern  . Not on file  Social History Narrative   Gets reg exercise    Family History  Problem Relation Age of Onset  . Hypertension Father   . Cancer Father        throat ; smoker  . Prostate cancer Father   . Stroke Paternal Grandmother        late 67s  . AAA (abdominal aortic aneurysm) Maternal Grandmother   . Cancer Mother 16       ? intra-abdominal  . Heart disease Neg Hx   . Diabetes Neg Hx    Social history is notable that he is married he has 2 children. He does exercise. Is no tobacco or alcohol use.  His daughter is an attorney in [place] involved with healthcare and a large firm doing exceptionally well.  His son is working in [place].  He is expecting his first grandchild April 2019   ROS General: Negative; No fevers, chills, or night sweats;  HEENT: Negative; No changes in vision or hearing, sinus congestion, difficulty swallowing Pulmonary: Negative; No cough, wheezing, shortness of breath, hemoptysis Cardiovascular: Negative; No chest pain, presyncope, syncope, palpitations GI: Negative; No nausea, vomiting, diarrhea, or abdominal pain GU: Negative; No dysuria, hematuria, or difficulty voiding Musculoskeletal: Negative; no myalgias, joint pain, or weakness Hematologic/Oncology: Negative; no easy bruising, bleeding Endocrine: Negative; no heat/cold intolerance; no diabetes Neuro: Negative; no changes in balance, headaches Skin: Negative; No rashes or skin lesions Psychiatric: Negative; No behavioral problems, depression Sleep: Negative; No snoring, daytime sleepiness, hypersomnolence,  bruxism, restless legs, hypnogognic hallucinations, no cataplexy Other comprehensive 14 point system review is negative.  PE BP (!) 144/76   Pulse (!) 52   Ht 6' (1.829 m)   Wt 218 lb 9.6 oz (99.2 kg)   BMI 29.65 kg/m    Repeat blood pressure by me was 134/78.  Wt Readings from Last 3 Encounters:  05/27/18 218 lb 9.6 oz (99.2 kg)  05/25/18 216 lb (98 kg)  07/21/17 215 lb 3.2 oz (97.6 kg)   General: Alert, oriented, no distress.  Skin: normal turgor, no rashes, warm and dry HEENT: Normocephalic, atraumatic. Pupils equal round and reactive to light; sclera anicteric; extraocular muscles intact; Nose without nasal septal hypertrophy Mouth/Parynx benign; Mallinpatti scale 2 Neck: No JVD, no carotid bruits; normal carotid upstroke Lungs: clear to ausculatation and percussion; no wheezing or rales Chest wall: without tenderness to palpitation Heart: PMI not displaced, RRR, s1 s2 normal, I did not hear any residual murmur of MR. no diastolic murmur, no rubs, gallops, thrills, or heaves Abdomen: soft, nontender; no hepatosplenomehaly, BS+; abdominal aorta nontender and not dilated by palpation. Back: no CVA tenderness Pulses 2+ Musculoskeletal: full range of motion, normal strength, no joint deformities Extremities: no clubbing cyanosis or edema, Homan's sign negative  Neurologic: grossly nonfocal; Cranial nerves grossly wnl Psychologic: Normal mood and affect   ECG (independently read by me): Sinus bradycardia 52 bpm.  No ectopy.  Normal intervals.  July 21, 2017 ECG (independently read by me): Sinus rhythm at 66 bpm with PAC.  Borderline first-degree block with a PR interval of 22 ms  December 2017 ECG (independently read by me): sinus bradycardia 59 bpm with a PAC.  Normal intervals.  NST segment changes.  November 2016 ECG (independently read by me): Normal sinus rhythm with an isolated PAC.  Normal intervals.  No ST segment changes.  November 2015 ECG (independently read  by me): NSR at 57; PR interval 220  ms.  QTc interval normal  Prior November 2014 ECG: Sinus at 71 beats per minute. No significant change the  LABS:  BMP Latest Ref Rng & Units 05/25/2018 05/21/2017 05/15/2016  Glucose 70 - 99 mg/dL 90 95 94  BUN 6 - 23 mg/dL 15 13 16   Creatinine 0.40 - 1.50 mg/dL 0.99 1.07 1.07  Sodium 135 - 145 mEq/L 140 138 139  Potassium 3.5 - 5.1 mEq/L 4.4 4.4 5.3  Chloride 96 - 112 mEq/L 104 101 105  CO2 19 - 32 mEq/L 26 27 24   Calcium 8.4 - 10.5 mg/dL 9.5 9.3 9.4   Hepatic Function Latest Ref Rng & Units 05/25/2018 05/21/2017 05/15/2016  Total Protein 6.0 - 8.3 g/dL 7.1 7.3 6.8  Albumin 3.5 - 5.2 g/dL 4.5 4.5 4.4  AST 0 - 37 U/L 18 15 19   ALT 0 - 53 U/L 15 13 15   Alk Phosphatase 39 - 117 U/L 42 50 55  Total Bilirubin 0.2 - 1.2 mg/dL 0.5 0.6 0.5  Bilirubin, Direct 0.0 - 0.3 mg/dL - - -   CBC Latest Ref Rng & Units 05/25/2018 05/21/2017 05/15/2016  WBC 4.0 - 10.5 K/uL 5.1 7.2 5.4  Hemoglobin 13.0 - 17.0 g/dL 14.7 15.0 14.6  Hematocrit 39.0 - 52.0 % 44.9 46.3 45.4  Platelets 150.0 - 400.0 K/uL 233.0 244.0 255   Lab Results  Component Value Date   MCV 88.7 05/25/2018   MCV 90.8 05/21/2017   MCV 88.7 05/15/2016   Lab Results  Component Value Date   TSH 3.13 05/25/2018  No results found for: HGBA1C   Lab Results  Component Value Date   PSA 0.1 05/15/2016   PSA <0.01 05/03/2015   PSA <0.01 04/25/2014   Lipid Panel     Component Value Date/Time   CHOL 165 05/25/2018 0914   TRIG 128.0 05/25/2018 0914   HDL 58.60 05/25/2018 0914   CHOLHDL 3 05/25/2018 0914   VLDL 25.6 05/25/2018 0914   LDLCALC 81 05/25/2018 0914   RADIOLOGY: No results found.  IMPRESSION:  1. S/P Robotic MVR (mitral valve repair),2007 along with ASD/PFO repair   2. Essential hypertension   3. Hyperlipidemia, unspecified hyperlipidemia type   4. History of prostate cancer      ASSESSMENT AND PLAN: Mr. Himebaugh is a 67 year old Caucasian male who is status post [place]  robotic mitral valve repair surgery which was performed on 04/10/2006 for severe MR due to a ruptured chordae at which time he had placement of 4 new Gore-Tex chordae, 2 from the medial, 2 from the lateral muscle which were brought to the edges of the posterior leaflet. He also had repair of a small atrial septal defect as well as a patent foramen ovale. Preoperatively he had normal coronary arteries. He is without anginal symptomatology.  I reviewed his most recent echo Doppler study which remained stable and shows repair of his mitral valve with plicated posterior leaflet without significant residual MR.  His left atrium was moderately dilated.  There was no residual PFO or ASD.  This was not significantly changed from his previous echo in November 2017.  He has continued to be asymptomatic.  He remains active.  His blood pressure today is upper normal on ramipril 5 mg in addition to Toprol-XL 12.5 mg which she has been taking twice a day.  Typically blood pressures run in the 120 range.  His ECG is stable.  He continues to be on rosuvastatin 20 mg for hyperlipidemia.  LDL cholesterol on  most recent check was 81.  He had normal coronary arteries at cardiac catheterization prior to his surgery.  He remains asymptomatic.  He will continue his current medical regimen.  He will monitor his blood pressure.  He will continue to exercise and he presently is playing tennis at least 2 days/week in addition to other activity.  He has remote history of prostate cancer; PSA was less than 0.1.  I will see him in 1 year for reevaluation or sooner if problems arise.  Time spent: 25 minutes. Troy Sine, MD, [facility]  05/28/2018 8:39 PM

## 2018-05-27 NOTE — Patient Instructions (Signed)

## 2018-05-28 ENCOUNTER — Encounter: Payer: Self-pay | Admitting: Cardiovascular Disease

## 2018-08-27 ENCOUNTER — Other Ambulatory Visit: Payer: Self-pay | Admitting: Cardiovascular Disease

## 2018-08-27 ENCOUNTER — Other Ambulatory Visit: Payer: Self-pay | Admitting: *Deleted

## 2018-08-27 MED ORDER — METOPROLOL SUCCINATE ER 25 MG PO TB24: 12.5000 mg | ORAL_TABLET | Freq: Two times a day (BID) | ORAL | 3 refills | Status: DC

## 2018-08-27 MED ORDER — RAMIPRIL 5 MG PO CAPS: 5.0000 mg | ORAL_CAPSULE | Freq: Every day | ORAL | 3 refills | Status: DC

## 2018-08-27 MED ORDER — ROSUVASTATIN CALCIUM 20 MG PO TABS: 20.0000 mg | ORAL_TABLET | Freq: Every day | ORAL | 3 refills | Status: DC

## 2018-08-27 NOTE — Telephone Encounter (Signed)
°*  STAT* If patient is at the pharmacy, call can be transferred to refill team.   1. Which medications need to be refilled? (please list name of each medication and dose if known)  metoprolol succinate (TOPROL-XL) 25 MG 24 hr tablet ramipril (ALTACE) 5 MG capsule rosuvastatin (CRESTOR) 20 MG tablet   2. Which pharmacy/location (including street and city if local pharmacy) is medication to be sent to? [facility] [phone] - [place], [place]    3. Do they need a 30 day or 90 day supply? 39   [facility] contacted you saying they were unable to get in touch with Dr. Evette Georges office for a refill request.

## 2018-08-27 NOTE — Telephone Encounter (Signed)
Rx(s) sent to pharmacy electronically. Patient has been notified directly and voiced understanding.  

## 2018-09-04 ENCOUNTER — Encounter: Payer: Self-pay | Admitting: Internal Medicine

## 2018-09-23 ENCOUNTER — Encounter: Payer: Self-pay | Admitting: Internal Medicine

## 2018-10-15 ENCOUNTER — Encounter: Payer: Self-pay | Admitting: Internal Medicine

## 2018-10-21 ENCOUNTER — Encounter: Payer: Self-pay | Admitting: Internal Medicine

## 2018-10-28 ENCOUNTER — Ambulatory Visit (INDEPENDENT_AMBULATORY_CARE_PROVIDER_SITE_OTHER): Payer: BLUE CROSS/BLUE SHIELD

## 2018-10-28 DIAGNOSIS — Z23 Encounter for immunization: Secondary | ICD-10-CM

## 2018-10-28 DIAGNOSIS — Z299 Encounter for prophylactic measures, unspecified: Secondary | ICD-10-CM

## 2019-01-11 ENCOUNTER — Encounter: Payer: Self-pay | Admitting: Internal Medicine

## 2019-02-22 ENCOUNTER — Other Ambulatory Visit: Payer: Self-pay

## 2019-02-22 ENCOUNTER — Ambulatory Visit (INDEPENDENT_AMBULATORY_CARE_PROVIDER_SITE_OTHER): Payer: BC Managed Care – PPO

## 2019-02-22 DIAGNOSIS — Z23 Encounter for immunization: Secondary | ICD-10-CM

## 2019-04-16 ENCOUNTER — Other Ambulatory Visit: Payer: Self-pay

## 2019-04-16 ENCOUNTER — Ambulatory Visit (INDEPENDENT_AMBULATORY_CARE_PROVIDER_SITE_OTHER): Payer: BC Managed Care – PPO

## 2019-04-16 DIAGNOSIS — Z23 Encounter for immunization: Secondary | ICD-10-CM

## 2019-04-16 DIAGNOSIS — Z299 Encounter for prophylactic measures, unspecified: Secondary | ICD-10-CM

## 2019-04-28 ENCOUNTER — Encounter: Payer: BC Managed Care – PPO | Admitting: Internal Medicine

## 2019-05-26 NOTE — Progress Notes (Signed)
Subjective:    Patient ID: William Lewis, male    DOB: 08/08/1950, 68 y.o.   MRN: [id number]  HPI He is here for a physical exam.   He is thinking about retiring the end of May 2021.   The end of last week his right ear started to feel clogged.  He put H2O2 and alcohol in it. It was ok, but a few days later it was worse.  He went to urgent care.  He had a lot of wax and they irrigated his ear and got the wax out.  It was painful.  He feels like the wax is back.  The ear is clogged and some soreness in the neck/around the ear.  It feels like there is drainage.   He has no other concerns.  Medications and allergies reviewed with patient and updated if appropriate.  Patient Active Problem List   Diagnosis Date Noted  . Schatzki's ring 10/17/2017  . Left leg pain 05/20/2016  . S/P MVR (mitral valve repair),2007 along with ASD repair 04/26/2013  . Hx of colonic polyps 04/14/2013  . Vitamin D deficiency 03/30/2012  . Essential hypertension 06/29/2010  . Hyperlipidemia 10/21/2007  . MITRAL REGURGITATION 10/21/2007  . HX, PERSONAL, MALIGNANCY, PROSTATE 10/23/2006    Current Outpatient Medications on File Prior to Visit  Medication Sig Dispense Refill  . aspirin 81 MG tablet Take 81 mg by mouth daily.      . Cholecalciferol (VITAMIN D3) 2000 UNITS TABS Take by mouth daily.    . fish oil-omega-3 fatty acids 1000 MG capsule Take by mouth daily.    . metoprolol succinate (TOPROL-XL) 25 MG 24 hr tablet Take 0.5 tablets (12.5 mg total) by mouth 2 (two) times daily. 90 tablet 3  . ramipril (ALTACE) 5 MG capsule Take 1 capsule (5 mg total) by mouth daily. 90 capsule 3  . rosuvastatin (CRESTOR) 20 MG tablet Take 1 tablet (20 mg total) by mouth at bedtime. 90 tablet 3   No current facility-administered medications on file prior to visit.    Past Medical History:  Diagnosis Date  . ASD (atrial septal defect) 2007   repaired with PFO in [place]  . Echocardiogram abnormal 2013   EF  [id number], normal diastolic parameters, mild mitral regur. with his post leaflet of MV calcified  . History of stress test 2008   neg.for ischemia  . HTN (hypertension)   . MVP (mitral valve prolapse) 2007   mitral valve repair in [place]  . Other and unspecified hyperlipidemia    Dr Ellouise Newer    Past Surgical History:  Procedure Laterality Date  . CARDIAC CATHETERIZATION  2007   EF 65-70%, significant mitral prolapse wth 4+ MR, normal coronary arteries   . COLONOSCOPY  2005 ; 2010;2014   polyp 2010 , Dr Earlean Shawl  . HERNIA REPAIR  2007  . I&D EXTREMITY     thumb, for infection  . MITRAL VALVE REPAIR  2007   Robotic surgery @ [address] [place] , [place] and ASD repair and PFO repair  . PROSTATE SURGERY  1995   TURP , Dr Serita Butcher    Social History   Socioeconomic History  . Marital status: Married    Spouse name: Not on file  . Number of children: Not on file  . Years of education: Not on file  . Highest education level: Not on file  Occupational History  . Not on file  Tobacco Use  . Smoking status: Former Smoker  Quit date: 06/10/1977    Years since quitting: 41.9  . Smokeless tobacco: Never Used  . Tobacco comment: from age [age], up to 1 ppd or less  Substance and Sexual Activity  . Alcohol use: Yes    Alcohol/week: 14.0 standard drinks    Types: 14 Glasses of wine per week  . Drug use: No  . Sexual activity: Not on file  Other Topics Concern  . Not on file  Social History Narrative   Gets reg exercise   Social Determinants of Health   Financial Resource Strain:   . Difficulty of Paying Living Expenses: Not on file  Food Insecurity:   . Worried About Charity fundraiser in the Last Year: Not on file  . Ran Out of Food in the Last Year: Not on file  Transportation Needs:   . Lack of Transportation (Medical): Not on file  . Lack of Transportation (Non-Medical): Not on file  Physical Activity:   . Days of Exercise per Week: Not on file  . Minutes of Exercise per  Session: Not on file  Stress:   . Feeling of Stress : Not on file  Social Connections:   . Frequency of Communication with Friends and Family: Not on file  . Frequency of Social Gatherings with Friends and Family: Not on file  . Attends Religious Services: Not on file  . Active Member of Clubs or Organizations: Not on file  . Attends Archivist Meetings: Not on file  . Marital Status: Not on file    Family History  Problem Relation Age of Onset  . Hypertension Father   . Cancer Father        throat ; smoker  . Prostate cancer Father   . Stroke Paternal Grandmother        late 28s  . AAA (abdominal aortic aneurysm) Maternal Grandmother   . Cancer Mother 58       ? intra-abdominal  . Heart disease Neg Hx   . Diabetes Neg Hx     Review of Systems  Constitutional: Negative for chills and fever.  HENT: Positive for ear pain (right ear, clogged).   Eyes: Negative for visual disturbance.  Respiratory: Negative for cough, shortness of breath and wheezing.   Cardiovascular: Negative for chest pain, palpitations and leg swelling.  Gastrointestinal: Negative for abdominal pain, blood in stool, constipation, diarrhea and nausea.       No gerd  Genitourinary: Negative for difficulty urinating, dysuria and hematuria.  Musculoskeletal: Negative for arthralgias.  Skin: Negative for color change and rash.  Neurological: Negative for dizziness, light-headedness, numbness and headaches.  Psychiatric/Behavioral: Negative for dysphoric mood. The patient is not nervous/anxious.        Objective:   Vitals:   05/27/19 0805  BP: 130/82  Pulse: 95  Temp: 98 F (36.7 C)  SpO2: 95%   Filed Weights   05/27/19 0805  Weight: 214 lb (97.1 kg)   Body mass index is 29.02 kg/m.  BP Readings from Last 3 Encounters:  05/27/19 130/82  05/27/18 (!) 144/76  05/25/18 118/80    Wt Readings from Last 3 Encounters:  05/27/19 214 lb (97.1 kg)  05/27/18 218 lb 9.6 oz (99.2 kg)    05/25/18 216 lb (98 kg)     Physical Exam Constitutional: He appears well-developed and well-nourished. No distress.  HENT:  Head: Normocephalic and atraumatic.  Right Ear: External ear normal.  Left Ear: External ear normal.  Mouth/Throat: Oropharynx is clear  and moist.  Normal ear canals and TM b/l  Eyes: Conjunctivae and EOM are normal.  Neck: Neck supple. No tracheal deviation present. No thyromegaly present.  No carotid bruit  Cardiovascular: Normal rate, regular rhythm, normal heart sounds and intact distal pulses.   No murmur heard. Pulmonary/Chest: Effort normal and breath sounds normal. No respiratory distress. He has no wheezes. He has no rales.  Abdominal: Soft. He exhibits no distension. There is no tenderness.  Genitourinary: deferred  Musculoskeletal: He exhibits no edema.  Lymphadenopathy:   He has no cervical adenopathy.  Skin: Skin is warm and dry. He is not diaphoretic.  Psychiatric: He has a normal mood and affect. His behavior is normal.         Assessment & Plan:   Physical exam: Screening blood work  ordered Immunizations  All up to date Colonoscopy   Up to date - Dr Earlean Shawl Eye exams   Up to date  Exercise   Regular - walks, bikes Weight  Normal BMI Substance abuse   none     See Problem List for Assessment and Plan of chronic medical problems.   This visit occurred during the SARS-CoV-2 public health emergency.  Safety protocols were in place, including screening questions prior to the visit, additional usage of staff PPE, and extensive cleaning of exam room while observing appropriate contact time as indicated for disinfecting solutions.    FU in one year

## 2019-05-26 NOTE — Patient Instructions (Addendum)
Tests ordered today. Your results will be released to [place] (or called to you) after review.  If any changes need to be made, you will be notified at that same time.  All other Health Maintenance issues reviewed.   All recommended immunizations and age-appropriate screenings are up-to-date or discussed.  No immunization administered today.   Medications reviewed and updated.  Changes include :   An antibiotic for your right ear   Your prescription(s) have been submitted to your pharmacy. Please take as directed and contact our office if you believe you are having problem(s) with the medication(s).   Please followup in 1 year    Health Maintenance, Male Adopting a healthy lifestyle and getting preventive care are important in promoting health and wellness. Ask your health care provider about:  The right schedule for you to have regular tests and exams.  Things you can do on your own to prevent diseases and keep yourself healthy. What should I know about diet, weight, and exercise? Eat a healthy diet   Eat a diet that includes plenty of vegetables, fruits, low-fat dairy products, and lean protein.  Do not eat a lot of foods that are high in solid fats, added sugars, or sodium. Maintain a healthy weight Body mass index (BMI) is a measurement that can be used to identify possible weight problems. It estimates body fat based on height and weight. Your health care provider can help determine your BMI and help you achieve or maintain a healthy weight. Get regular exercise Get regular exercise. This is one of the most important things you can do for your health. Most adults should:  Exercise for at least 150 minutes each week. The exercise should increase your heart rate and make you sweat (moderate-intensity exercise).  Do strengthening exercises at least twice a week. This is in addition to the moderate-intensity exercise.  Spend less time sitting. Even light physical activity can  be beneficial. Watch cholesterol and blood lipids Have your blood tested for lipids and cholesterol at 69 years of age, then have this test every 5 years. You may need to have your cholesterol levels checked more often if:  Your lipid or cholesterol levels are high.  You are older than 70 years of age.  You are at high risk for heart disease. What should I know about cancer screening? Many types of cancers can be detected early and may often be prevented. Depending on your health history and family history, you may need to have cancer screening at various ages. This may include screening for:  Colorectal cancer.  Prostate cancer.  Skin cancer.  Lung cancer. What should I know about heart disease, diabetes, and high blood pressure? Blood pressure and heart disease  High blood pressure causes heart disease and increases the risk of stroke. This is more likely to develop in people who have high blood pressure readings, are of African descent, or are overweight.  Talk with your health care provider about your target blood pressure readings.  Have your blood pressure checked: ? Every 3-5 years if you are 65-28 years of age. ? Every year if you are 11 years old or older.  If you are between the ages of 21 and 46 and are a current or former smoker, ask your health care provider if you should have a one-time screening for abdominal aortic aneurysm (AAA). Diabetes Have regular diabetes screenings. This checks your fasting blood sugar level. Have the screening done:  Once every three years after  age 57 if you are at a normal weight and have a low risk for diabetes.  More often and at a younger age if you are overweight or have a high risk for diabetes. What should I know about preventing infection? Hepatitis B If you have a higher risk for hepatitis B, you should be screened for this virus. Talk with your health care provider to find out if you are at risk for hepatitis B  infection. Hepatitis C Blood testing is recommended for:  Everyone born from [birth year] through 1967.  Anyone with known risk factors for hepatitis C. Sexually transmitted infections (STIs)  You should be screened each year for STIs, including gonorrhea and chlamydia, if: ? You are sexually active and are younger than 70 years of age. ? You are older than 70 years of age and your health care provider tells you that you are at risk for this type of infection. ? Your sexual activity has changed since you were last screened, and you are at increased risk for chlamydia or gonorrhea. Ask your health care provider if you are at risk.  Ask your health care provider about whether you are at high risk for HIV. Your health care provider may recommend a prescription medicine to help prevent HIV infection. If you choose to take medicine to prevent HIV, you should first get tested for HIV. You should then be tested every 3 months for as long as you are taking the medicine. Follow these instructions at home: Lifestyle  Do not use any products that contain nicotine or tobacco, such as cigarettes, e-cigarettes, and chewing tobacco. If you need help quitting, ask your health care provider.  Do not use street drugs.  Do not share needles.  Ask your health care provider for help if you need support or information about quitting drugs. Alcohol use  Do not drink alcohol if your health care provider tells you not to drink.  If you drink alcohol: ? Limit how much you have to 0-2 drinks a day. ? Be aware of how much alcohol is in your drink. In the [place] one drink equals one 12 oz bottle of beer (355 mL), one 5 oz glass of wine (148 mL), or one 1 oz glass of hard liquor (44 mL). General instructions  Schedule regular health, dental, and eye exams.  Stay current with your vaccines.  Tell your health care provider if: ? You often feel depressed. ? You have ever been abused or do not feel safe at  home. Summary  Adopting a healthy lifestyle and getting preventive care are important in promoting health and wellness.  Follow your health care provider's instructions about healthy diet, exercising, and getting tested or screened for diseases.  Follow your health care provider's instructions on monitoring your cholesterol and blood pressure. This information is not intended to replace advice given to you by your health care provider. Make sure you discuss any questions you have with your health care provider. Document Released: 11/23/2007 Document Revised: 05/20/2018 Document Reviewed: 05/20/2018 Elsevier Patient Education  2020 [employer].

## 2019-05-27 ENCOUNTER — Ambulatory Visit (INDEPENDENT_AMBULATORY_CARE_PROVIDER_SITE_OTHER): Payer: BC Managed Care – PPO | Admitting: Internal Medicine

## 2019-05-27 ENCOUNTER — Other Ambulatory Visit: Payer: Self-pay

## 2019-05-27 ENCOUNTER — Encounter: Payer: Self-pay | Admitting: Internal Medicine

## 2019-05-27 ENCOUNTER — Other Ambulatory Visit (INDEPENDENT_AMBULATORY_CARE_PROVIDER_SITE_OTHER): Payer: BC Managed Care – PPO

## 2019-05-27 VITALS — BP 130/82 | HR 95 | Temp 98.0°F | Ht 72.0 in | Wt 214.0 lb

## 2019-05-27 DIAGNOSIS — H6091 Unspecified otitis externa, right ear: Secondary | ICD-10-CM | POA: Insufficient documentation

## 2019-05-27 DIAGNOSIS — I10 Essential (primary) hypertension: Secondary | ICD-10-CM

## 2019-05-27 DIAGNOSIS — E782 Mixed hyperlipidemia: Secondary | ICD-10-CM

## 2019-05-27 DIAGNOSIS — Z0000 Encounter for general adult medical examination without abnormal findings: Secondary | ICD-10-CM

## 2019-05-27 DIAGNOSIS — Z8546 Personal history of malignant neoplasm of prostate: Secondary | ICD-10-CM

## 2019-05-27 DIAGNOSIS — H6090 Unspecified otitis externa, unspecified ear: Secondary | ICD-10-CM | POA: Insufficient documentation

## 2019-05-27 DIAGNOSIS — H60391 Other infective otitis externa, right ear: Secondary | ICD-10-CM

## 2019-05-27 LAB — LIPID PANEL
Cholesterol: 157 mg/dL (ref 0–200)
HDL: 56.7 mg/dL (ref >39.00)
LDL Cholesterol: 79 mg/dL (ref 0–99)
NonHDL: 100.02
Total CHOL/HDL Ratio: 3
Triglycerides: 107 mg/dL (ref 0.0–149.0)
VLDL: 21.4 mg/dL (ref 0.0–40.0)

## 2019-05-27 LAB — COMPREHENSIVE METABOLIC PANEL
ALT: 13 U/L (ref 0–53)
AST: 16 U/L (ref 0–37)
Albumin: 4.3 g/dL (ref 3.5–5.2)
Alkaline Phosphatase: 52 U/L (ref 39–117)
BUN: 16 mg/dL (ref 6–23)
CO2: 28 mEq/L (ref 19–32)
Calcium: 9.6 mg/dL (ref 8.4–10.5)
Chloride: 103 mEq/L (ref 96–112)
Creatinine, Ser: 1.04 mg/dL (ref 0.40–1.50)
GFR: 70.57 mL/min (ref >60.00)
Glucose, Bld: 107 mg/dL — ABNORMAL HIGH (ref 70–99)
Potassium: 4.9 mEq/L (ref 3.5–5.1)
Sodium: 139 mEq/L (ref 135–145)
Total Bilirubin: 0.6 mg/dL (ref 0.2–1.2)
Total Protein: 7.1 g/dL (ref 6.0–8.3)

## 2019-05-27 LAB — CBC WITH DIFFERENTIAL/PLATELET
Basophils Absolute: 0 10*3/uL (ref 0.0–0.1)
Basophils Relative: 0.5 % (ref 0.0–3.0)
Eosinophils Absolute: 0.1 10*3/uL (ref 0.0–0.7)
Eosinophils Relative: 1.4 % (ref 0.0–5.0)
HCT: 45 % (ref 39.0–52.0)
Hemoglobin: 14.7 g/dL (ref 13.0–17.0)
Lymphocytes Relative: 13.1 % (ref 12.0–46.0)
Lymphs Abs: 0.9 10*3/uL (ref 0.7–4.0)
MCHC: 32.7 g/dL (ref 30.0–36.0)
MCV: 88.9 fl (ref 78.0–100.0)
Monocytes Absolute: 0.7 10*3/uL (ref 0.1–1.0)
Monocytes Relative: 10.2 % (ref 3.0–12.0)
Neutro Abs: 5.4 10*3/uL (ref 1.4–7.7)
Neutrophils Relative %: 74.8 % (ref 43.0–77.0)
Platelets: 239 10*3/uL (ref 150.0–400.0)
RBC: 5.06 Mil/uL (ref 4.22–5.81)
RDW: 13.7 % (ref 11.5–15.5)
WBC: 7.2 10*3/uL (ref 4.0–10.5)

## 2019-05-27 LAB — TSH: TSH: 4.24 u[IU]/mL (ref 0.35–4.50)

## 2019-05-27 MED ORDER — NEOMYCIN-POLYMYXIN-HC 3.5-10000-1 OT SOLN: 4.0000 [drp] | Freq: Four times a day (QID) | OTIC | 0 refills | Status: DC

## 2019-05-27 NOTE — Assessment & Plan Note (Signed)
Check lipid panel,cmp ,tsh Continue daily statin Regular exercise and healthy diet encouraged  

## 2019-05-27 NOTE — Assessment & Plan Note (Signed)
abx ear drops Call if no improvement

## 2019-05-27 NOTE — Assessment & Plan Note (Signed)
No longer following with urology psa today

## 2019-05-27 NOTE — Assessment & Plan Note (Signed)
BP well controlled Current regimen effective and well tolerated Continue current medications at current doses cmp  

## 2019-05-28 LAB — PSA, TOTAL AND FREE
PSA, % Free: UNDETERMINED % (calc) (ref >25)
PSA, Free: <0.1 ng/mL
PSA, Total: <0.1 ng/mL (ref <=4.0)

## 2019-06-14 ENCOUNTER — Encounter: Payer: Self-pay | Admitting: Internal Medicine

## 2019-06-17 ENCOUNTER — Ambulatory Visit: Payer: BC Managed Care – PPO | Admitting: Cardiovascular Disease

## 2019-06-17 ENCOUNTER — Other Ambulatory Visit: Payer: Self-pay

## 2019-06-17 ENCOUNTER — Encounter: Payer: Self-pay | Admitting: Cardiovascular Disease

## 2019-06-17 VITALS — BP 152/90 | HR 62 | Temp 97.0°F | Ht 72.0 in | Wt 215.0 lb

## 2019-06-17 DIAGNOSIS — E785 Hyperlipidemia, unspecified: Secondary | ICD-10-CM

## 2019-06-17 DIAGNOSIS — Z09 Encounter for follow-up examination after completed treatment for conditions other than malignant neoplasm: Secondary | ICD-10-CM

## 2019-06-17 DIAGNOSIS — I10 Essential (primary) hypertension: Secondary | ICD-10-CM | POA: Diagnosis not present

## 2019-06-17 DIAGNOSIS — Z9889 Other specified postprocedural states: Secondary | ICD-10-CM

## 2019-06-17 DIAGNOSIS — Z8546 Personal history of malignant neoplasm of prostate: Secondary | ICD-10-CM

## 2019-06-17 NOTE — Progress Notes (Signed)
Patient ID: ELIZANDRO LAURA, male   DOB: Jan 22, 1951, 69 y.o.   MRN: [id number]     HPI: ABDUL BEIRNE is a 69 y.o. male who presents to the office for a 13 month cardiology followup evaluation.  Mr. Kodi Steil underwent mitral valve repair robotically by Dr[facility] a 04/10/2006 for significant mitral valve prolapse with partial flail posterior P2 leaflet. He had 4+ preoperative mitral regurgitation and also had an ASD as well as a patent foramen ovale which were also successfully repaired. At the time of his surgery, his left atrial appendage was closed.  Preoperatively, he was documented to have normal coronary arteries by cardiac catheterization. Last year he underwent a 18 month followup echo Doppler study which continued to show excellent result. Left ventricular ejection fraction was 60-65% and he did not have regional wall motion abnormalities. His mitral annular ring prosthesis was present. There is no mention in his current echo of any residual mitral regurgitation whereas on his prior echo in 2013 he was felt to have mild MR. All other valvular anatomy was normal.  He has a remote history of prostate CA which developed at age 61 at which time he underwent surgery.  When I saw him in 2016  he was continuing to be active and had a good year.,he was playing teis regularly and riding his bike. He had lost his brother and is mother which had led to some increased stress. He continues to do well.  He is still working at least 40-50 hours per week.  He is biking less, but playing tennis and golf without cardiovascular restrictions. He is unaware of any palpitations. He denies any chest pain.  He denies presyncope or syncope.  In June 2017 he presented to the emergency room after a piece of chicken had gotten stuck in his esophagus.  He required IV glucagon and subsequently underwent endoscopy by Dr. Earlean Shawl which reportedly was unremarkable.   He underwent a follow-up echo Doppler  study on 04/22/2016.  Ejection fraction is 60-65%.  Mild LVHwithout wall motion abnormalities.  There was grade 1 diastolic dysfunction.  He is a setting aorta was minimally icreased at 41 mm.  His mitral annulus ring/repair was intact.  There was no mention of mitral regurgion.  There was mild biatrial enlargement.  He had normal pulmonary pressures.  I had seen him in follow-up in December 2017.  I saw him in February 2019 and last saw him in December 2019. 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  He continued to exercise.  He was not biking as much as he had in the past.  He underwent a follow-up echo Doppler study on April 20, 2018 which showed an EF of 60 to 65%.  He had normal wall motion.  His mitral valve repair was stable.  There was no significant residual mitral regurgitation.  Left atrium was mildly dilated.  He denies any chest pain.  He is unaware of any episodes of atrial fibrillation or palpitations.  He had laboratory 2 days ago.  TSH was normal.  Total cholesterol 165, triglycerides 128, HDL 58, LDL cholesterol 81.  Electrolytes were normal.  Renal function and LFTs were normal.    Over the past year, he continues to be stable from a cardiac standpoint.  He plans to retire on November 12, 2019.  He continues to exercise.  With the COVID-19 pandemic  he is not playing as much tennis that he had in the past.  He continues to exercise on exercise bike at home.  He denies chest pain.  He denies change in exercise capacity.  He continues to be on ramipril 5 mg and metoprolol succinate 12.5 mg daily for blood pressure.  He is unaware of any cardiac arrhythmia.  He is on rosuvastatin 20 mg in addition to omega-3 fatty acid.  He continues takes aspirin and takes supplemental vitamin D.  He presents for evaluation.  Past Medical History:  Diagnosis Date  . ASD (atrial septal defect) 2007   repaired with PFO in  [place]  . Echocardiogram abnormal 2013   EF 17-79%, normal diastolic parameters, mild mitral regur. with his post leaflet of MV calcified  . History of stress test 2008   neg.for ischemia  . HTN (hypertension)   . MVP (mitral valve prolapse) 2007   mitral valve repair in [place]  . Other and unspecified hyperlipidemia    Dr Ellouise Newer    Past Surgical History:  Procedure Laterality Date  . CARDIAC CATHETERIZATION  2007   EF 65-70%, significant mitral prolapse wth 4+ MR, normal coronary arteries   . COLONOSCOPY  2005 ; 2010;2014   polyp 2010 , Dr Earlean Shawl  . HERNIA REPAIR  2007  . I & D EXTREMITY     thumb, for infection  . MITRAL VALVE REPAIR  2007   Robotic surgery @ [address] [place] , [place] and ASD repair and PFO repair  . PROSTATE SURGERY  1995   TURP , Dr Serita Butcher    No Known Allergies  Current Outpatient Medications  Medication Sig Dispense Refill  . aspirin 81 MG tablet Take 81 mg by mouth daily.      . Cholecalciferol (VITAMIN D3) 2000 UNITS TABS Take by mouth daily.    . fish oil-omega-3 fatty acids 1000 MG capsule Take by mouth daily.    . metoprolol succinate (TOPROL-XL) 25 MG 24 hr tablet Take 0.5 tablets (12.5 mg total) by mouth 2 (two) times daily. 90 tablet 3  . ramipril (ALTACE) 5 MG capsule Take 1 capsule (5 mg total) by mouth daily. 90 capsule 3  . rosuvastatin (CRESTOR) 20 MG tablet Take 1 tablet (20 mg total) by mouth at bedtime. 90 tablet 3  . neomycin-polymyxin-hydrocortisone (CORTISPORIN) OTIC solution Place 4 drops into the right ear 4 (four) times daily. Use for 7 days 10 mL 0   No current facility-administered medications for this visit.    Social History   Socioeconomic History  . Marital status: Married    Spouse name: Not on file  . Number of children: Not on file  . Years of education: Not on file  . Highest education level: Not on file  Occupational History  . Not on file  Tobacco Use  . Smoking status: Former Smoker    Quit date:  06/10/1977    Years since quitting: 42.0  . Smokeless tobacco: Never Used  . Tobacco comment: from age [age], up to 1 ppd or less  Substance and Sexual Activity  . Alcohol use: Yes    Alcohol/week: 14.0 standard drinks    Types: 14 Glasses of wine per week  . Drug use: No  . Sexual activity: Not on file  Other Topics Concern  . Not on file  Social History Narrative   Gets reg exercise   Social Determinants of Health   Financial Resource Strain:   . Difficulty of Paying Living  Expenses: Not on file  Food Insecurity:   . Worried About Charity fundraiser in the Last Year: Not on file  . Ran Out of Food in the Last Year: Not on file  Transportation Needs:   . Lack of Transportation (Medical): Not on file  . Lack of Transportation (Non-Medical): Not on file  Physical Activity:   . Days of Exercise per Week: Not on file  . Minutes of Exercise per Session: Not on file  Stress:   . Feeling of Stress : Not on file  Social Connections:   . Frequency of Communication with Friends and Family: Not on file  . Frequency of Social Gatherings with Friends and Family: Not on file  . Attends Religious Services: Not on file  . Active Member of Clubs or Organizations: Not on file  . Attends Archivist Meetings: Not on file  . Marital Status: Not on file  Intimate Partner Violence:   . Fear of Current or Ex-Partner: Not on file  . Emotionally Abused: Not on file  . Physically Abused: Not on file  . Sexually Abused: Not on file    Family History  Problem Relation Age of Onset  . Hypertension Father   . Cancer Father        throat ; smoker  . Prostate cancer Father   . Stroke Paternal Grandmother        late 72s  . AAA (abdominal aortic aneurysm) Maternal Grandmother   . Cancer Mother 38       ? intra-abdominal  . Heart disease Neg Hx   . Diabetes Neg Hx    Social history is notable that he is married he has 2 children. He does exercise. Is no tobacco or alcohol use.  His  daughter is an attorney in [place] involved with healthcare and a large firm doing exceptionally well.  His son is working in [place].  He is expecting his first grandchild April 2019   ROS General: Negative; No fevers, chills, or night sweats;  HEENT: Negative; No changes in vision or hearing, sinus congestion, difficulty swallowing Pulmonary: Negative; No cough, wheezing, shortness of breath, hemoptysis Cardiovascular: Negative; No chest pain, presyncope, syncope, palpitations GI: Negative; No nausea, vomiting, diarrhea, or abdominal pain GU: Negative; No dysuria, hematuria, or difficulty voiding Musculoskeletal: Negative; no myalgias, joint pain, or weakness Hematologic/Oncology: Negative; no easy bruising, bleeding Endocrine: Negative; no heat/cold intolerance; no diabetes Neuro: Negative; no changes in balance, headaches Skin: Negative; No rashes or skin lesions Psychiatric: Negative; No behavioral problems, depression Sleep: Negative; No snoring, daytime sleepiness, hypersomnolence, bruxism, restless legs, hypnogognic hallucinations, no cataplexy Other comprehensive 14 point system review is negative.  PE BP (!) 152/90   Pulse 62   Temp (!) 97 F (36.1 C)   Ht 6' (1.829 m)   Wt 215 lb (97.5 kg)   SpO2 99%   BMI 29.16 kg/m    Repeat blood pressure by me was 130/80.  He states when he saw his primary physician in December his blood pressure was also 130/80.  Wt Readings from Last 3 Encounters:  06/17/19 215 lb (97.5 kg)  05/27/19 214 lb (97.1 kg)  05/27/18 218 lb 9.6 oz (99.2 kg)   General: Alert, oriented, no distress.  Skin: normal turgor, no rashes, warm and dry HEENT: Normocephalic, atraumatic. Pupils equal round and reactive to light; sclera anicteric; extraocular muscles intact;  Nose without nasal septal hypertrophy Mouth/Parynx benign; Mallinpatti scale previously noted to be 2 Neck: No  JVD, no carotid bruits; normal carotid upstroke Lungs: clear to  ausculatation and percussion; no wheezing or rales Chest wall: without tenderness to palpitation Heart: PMI not displaced, RRR, s1 s2 normal, I did not hear any residual murmur of mitral regurgitation, no diastolic murmur, no rubs, gallops, thrills, or heaves Abdomen: soft, nontender; no hepatosplenomehaly, BS+; abdominal aorta nontender and not dilated by palpation. Back: no CVA tenderness Pulses 2+ Musculoskeletal: full range of motion, normal strength, no joint deformities Extremities: no clubbing cyanosis or edema, Homan's sign negative  Neurologic: grossly nonfocal; Cranial nerves grossly wnl Psychologic: Normal mood and affect   ECG (independently read by me): Normal sinus rhythm at 62 bpm.  Left axis deviation.  No ectopy.  Normal intervals.  December 2019 ECG (independently read by me): Sinus bradycardia 52 bpm.  No ectopy.  Normal intervals.  July 21, 2017 ECG (independently read by me): Sinus rhythm at 66 bpm with PAC.  Borderline first-degree block with a PR interval of 22 ms  December 2017 ECG (independently read by me): sinus bradycardia 59 bpm with a PAC.  Normal intervals.  NST segment changes.  November 2016 ECG (independently read by me): Normal sinus rhythm with an isolated PAC.  Normal intervals.  No ST segment changes.  November 2015 ECG (independently read by me): NSR at 57; PR interval 220 ms.  QTc interval normal  Prior November 2014 ECG: Sinus at 71 beats per minute. No significant change the  LABS:  BMP Latest Ref Rng & Units 05/27/2019 05/25/2018 05/21/2017  Glucose 70 - 99 mg/dL 107(H) 90 95  BUN 6 - 23 mg/dL _0 Creatinine 0.40 - 1.50 mg/dL 1.04 0.99 1.07  Sodium 135 - 145 mEq/L 139 140 138  Potassium 3.5 - 5.1 mEq/L 4.9 4.4 4.4  Chloride 96 - 112 mEq/L 103 104 101  CO2 19 - 32 mEq/L _1 Calcium 8.4 - 10.5 mg/dL 9.6 9.5 9.3   Hepatic Function Latest Ref Rng & Units 05/27/2019 05/25/2018 05/21/2017  Total Protein 6.0 - 8.3 g/dL 7.1 7.1  7.3  Albumin 3.5 - 5.2 g/dL 4.3 4.5 4.5  AST 0 - 37 U/L _2 ALT 0 - 53 U/L _3 Alk Phosphatase 39 - 117 U/L 52 42 50  Total Bilirubin 0.2 - 1.2 mg/dL 0.6 0.5 0.6  Bilirubin, Direct 0.0 - 0.3 mg/dL - - -   CBC Latest Ref Rng & Units 05/27/2019 05/25/2018 05/21/2017  WBC 4.0 - 10.5 K/uL 7.2 5.1 7.2  Hemoglobin 13.0 - 17.0 g/dL 14.7 14.7 15.0  Hematocrit 39.0 - 52.0 % 45.0 44.9 46.3  Platelets 150.0 - 400.0 K/uL 239.0 233.0 244.0   Lab Results  Component Value Date   MCV 88.9 05/27/2019   MCV 88.7 05/25/2018   MCV 90.8 05/21/2017   Lab Results  Component Value Date   TSH 4.24 05/27/2019  No results found for: HGBA1C   Lab Results  Component Value Date   PSA 0.1 05/15/2016   PSA <0.01 05/03/2015   PSA <0.01 04/25/2014   Lipid Panel     Component Value Date/Time   CHOL 157 05/27/2019 0851   TRIG 107.0 05/27/2019 0851   HDL 56.70 05/27/2019 0851   CHOLHDL 3 05/27/2019 0851   VLDL 21.4 05/27/2019 0851   LDLCALC 79 05/27/2019 0851   RADIOLOGY: No results found.  IMPRESSION:  1. S/P Robotic MVR (mitral valve repair) for severe MR and ASD and PFO repair: November 2007  2. Essential hypertension   3. Hyperlipidemia, unspecified hyperlipidemia type   4. History of prostate cancer      ASSESSMENT AND PLAN: Mr. Simonin is a 69 year-old Caucasian male who is status post da Vinci robotic mitral valve repair surgery performed on 04/10/2006 for severe MR due to a ruptured chordae at which time he had placement of 4 new Gore-Tex chordae, 2 from the medial, 2 from the lateral muscle which were brought to the edges of the posterior leaflet. He also had repair of a small atrial septal defect as well as a patent foramen ovale. Preoperatively he had normal coronary arteries. He does not have any anginal symptomatology and has remained asymptomatic postoperatively.  I again reviewed his echo Doppler study from November 2019 which remained stable and shows repair of his mitral  valve with plicated posterior leaflet without significant residual MR.  His left atrium was moderately dilated.  There was no residual PFO or ASD.  This was not significantly changed from his previous echo in November 2017.  He continues to exercise and has been riding a stationary bike during the cold weather and winter months.  He has not been playing tennis due to the Covid pandemic.  He sees Billey Gosling for primary care who checked laboratory.  Lab work in December 2020 showed an LDL cholesterol at 79 with total cholesterol 157.  Renal function was stable with creatinine 1.04.  TSH was 4.24.  He has remote history of prostate CA with stable PSA.  He is tolerating ramipril 5 mg daily in addition to Toprol-XL 12.5 mg which he has been taking 2 times a day.  ECG shows normal sinus rhythm with a ventricular rate at 62 bpm.  He is on rosuvastatin and omega-3 fatty acid for his lipids.  I have recommended in November 2021 he undergo a 2-year follow-up echo Doppler study.  I will see him in the office in follow-up of the above study and further recommendations were made at that time.  Troy Sine, MD, [facility]  06/19/2019 3:05 PM

## 2019-06-17 NOTE — Patient Instructions (Signed)
Medication Instructions:  NO CHANGES *If you need a refill on your cardiac medications before your next appointment, please call your pharmacy*  Lab Work: If you have labs (blood work) drawn today and your tests are completely normal, you will receive your results only by: Marland Kitchen MyChart Message (if you have MyChart) OR . A paper copy in the mail If you have any lab test that is abnormal or we need to change your treatment, we will call you to review the results.  Testing/Procedures: Your physician has requested that you have an echocardiogram. Echocardiography is a painless test that uses sound waves to create images of your heart. It provides your doctor with information about the size and shape of your heart and how well your heart's chambers and valves are working. This procedure takes approximately one hour. There are no restrictions for this procedure. [place]   Follow-Up: At [facility], you and your health needs are our priority.   As part of our continuing mission to provide you with exceptional heart care, we have created designated Provider Care Teams.  These Care Teams include your primary Cardiologist (physician) and Advanced Practice Providers (APPs -  Physician Assistants and Nurse Practitioners) who all work together to provide you with the care you need, when you need it.  Your next appointment:   10 month(s)  IN November 2021 FOLLOWING ECHO RESULTS  The format for your next appointment:   Either In Person or Virtual  Provider:   Shelva Majestic, MD

## 2019-06-19 ENCOUNTER — Encounter: Payer: Self-pay | Admitting: Cardiovascular Disease

## 2019-06-30 ENCOUNTER — Encounter: Payer: Self-pay | Admitting: Internal Medicine

## 2019-08-06 ENCOUNTER — Encounter: Payer: Self-pay | Admitting: Internal Medicine

## 2019-09-23 ENCOUNTER — Other Ambulatory Visit: Payer: Self-pay

## 2019-09-23 MED ORDER — RAMIPRIL 5 MG PO CAPS: 5.0000 mg | ORAL_CAPSULE | Freq: Every day | ORAL | 3 refills | Status: DC

## 2019-09-23 MED ORDER — ROSUVASTATIN CALCIUM 20 MG PO TABS: 20.0000 mg | ORAL_TABLET | Freq: Every day | ORAL | 3 refills | Status: DC

## 2019-09-23 MED ORDER — METOPROLOL SUCCINATE ER 25 MG PO TB24: 12.5000 mg | ORAL_TABLET | Freq: Two times a day (BID) | ORAL | 3 refills | Status: DC

## 2020-01-06 ENCOUNTER — Other Ambulatory Visit: Payer: Self-pay

## 2020-01-06 MED ORDER — ROSUVASTATIN CALCIUM 20 MG PO TABS: 20.0000 mg | ORAL_TABLET | Freq: Every day | ORAL | 3 refills | Status: DC

## 2020-01-06 MED ORDER — RAMIPRIL 5 MG PO CAPS: 5.0000 mg | ORAL_CAPSULE | Freq: Every day | ORAL | 3 refills | Status: DC

## 2020-01-06 MED ORDER — METOPROLOL SUCCINATE ER 25 MG PO TB24: 12.5000 mg | ORAL_TABLET | Freq: Two times a day (BID) | ORAL | 3 refills | Status: DC

## 2020-01-06 NOTE — Telephone Encounter (Signed)
Pt calling requesting his medications be sent to his new pharmacy [facility] mail order pharmacy. Please address

## 2020-01-31 ENCOUNTER — Other Ambulatory Visit: Payer: Self-pay

## 2020-01-31 MED ORDER — ROSUVASTATIN CALCIUM 20 MG PO TABS: 20.0000 mg | ORAL_TABLET | Freq: Every day | ORAL | 3 refills | Status: DC

## 2020-01-31 MED ORDER — RAMIPRIL 5 MG PO CAPS: 5.0000 mg | ORAL_CAPSULE | Freq: Every day | ORAL | 3 refills | Status: DC

## 2020-01-31 MED ORDER — METOPROLOL SUCCINATE ER 25 MG PO TB24: 12.5000 mg | ORAL_TABLET | Freq: Two times a day (BID) | ORAL | 3 refills | Status: DC

## 2020-02-16 ENCOUNTER — Encounter: Payer: Self-pay | Admitting: Internal Medicine

## 2020-03-14 ENCOUNTER — Encounter: Payer: Self-pay | Admitting: Internal Medicine

## 2020-04-17 ENCOUNTER — Other Ambulatory Visit: Payer: Self-pay

## 2020-04-17 ENCOUNTER — Ambulatory Visit (HOSPITAL_COMMUNITY): Payer: PPO | Attending: Cardiology

## 2020-04-17 DIAGNOSIS — Z954 Presence of other heart-valve replacement: Secondary | ICD-10-CM | POA: Diagnosis not present

## 2020-04-17 DIAGNOSIS — E785 Hyperlipidemia, unspecified: Secondary | ICD-10-CM | POA: Insufficient documentation

## 2020-04-17 DIAGNOSIS — I10 Essential (primary) hypertension: Secondary | ICD-10-CM | POA: Insufficient documentation

## 2020-04-17 DIAGNOSIS — Z9889 Other specified postprocedural states: Secondary | ICD-10-CM

## 2020-04-17 LAB — ECHOCARDIOGRAM COMPLETE
Area-P 1/2: 2.39 cm2
S' Lateral: 3.1 cm

## 2020-04-27 ENCOUNTER — Encounter: Payer: Self-pay | Admitting: Cardiovascular Disease

## 2020-04-27 ENCOUNTER — Telehealth (INDEPENDENT_AMBULATORY_CARE_PROVIDER_SITE_OTHER): Payer: PPO | Admitting: Cardiovascular Disease

## 2020-04-27 DIAGNOSIS — E785 Hyperlipidemia, unspecified: Secondary | ICD-10-CM | POA: Diagnosis not present

## 2020-04-27 DIAGNOSIS — Z9889 Other specified postprocedural states: Secondary | ICD-10-CM | POA: Diagnosis not present

## 2020-04-27 DIAGNOSIS — I10 Essential (primary) hypertension: Secondary | ICD-10-CM

## 2020-04-27 DIAGNOSIS — Z8546 Personal history of malignant neoplasm of prostate: Secondary | ICD-10-CM | POA: Diagnosis not present

## 2020-04-27 DIAGNOSIS — I77810 Thoracic aortic ectasia: Secondary | ICD-10-CM

## 2020-04-27 NOTE — Progress Notes (Signed)
Virtual Visit via Video Note   This visit type was conducted due to national recommendations for restrictions regarding the COVID-19 Pandemic (e.g. social distancing) in an effort to limit this patient's exposure and mitigate transmission in our community.  Due to his co-morbid illnesses, this patient is at least at moderate risk for complications without adequate follow up.  This format is felt to be most appropriate for this patient at this time.  All issues noted in this document were discussed and addressed.  A limited physical exam was performed with this format.  Please refer to the patient's chart for his consent to telehealth for [facility].     Date:  04/27/2020   ID:  William Lewis, DOB December 30, 1950, MRN [id number] The patient was identified using 2 identifiers.  Patient Location: Home Provider Location: Home Office  PCP:  Binnie Rail, MD  Cardiologist:  Shelva Majestic, MD Electrophysiologist:  None   Evaluation Performed:  Follow-Up Visit  Chief Complaint: 46-month follow-up  History of Present Illness:    William Lewis is a 69 y.o. male who underwent mitral valve repair robotically by Dr. Elveria Rising [facility] a 04/10/2006 for significant mitral valve prolapse with partial flail posterior P2 leaflet. He had 4+ preoperative mitral regurgitation and also had an ASD as well as a patent foramen ovale which were also successfully repaired. At the time of his surgery, his left atrial appendage was closed.  Preoperatively, he was documented to have normal coronary arteries by cardiac catheterization. An  18 month followup echo Doppler study  continued to show excellent result. Left ventricular ejection fraction was 60-65% and he did not have regional wall motion abnormalities. His mitral annular ring prosthesis was present. There is no mention in his current echo of any residual mitral regurgitation whereas on his prior echo in 2013 he was felt to have mild MR. All other  valvular anatomy was normal.  He has a remote history of prostate CA which developed at age 54 at which time he underwent surgery.  When I saw him in 2016  he was continuing to be active and had a good year.  He was playing tennis regularly and riding his bike. He had lost his brother and is mother which had led to some increased stress. He continues to do well.  He is still working at least 40-50 hours per week.  He is biking less, but playing tennis and golf without cardiovascular restrictions. He is unaware of any palpitations. He denies any chest pain.  He denies presyncope or syncope.  In June 2017 he presented to the emergency room after a piece of chicken had gotten stuck in his esophagus.  He required IV glucagon and subsequently underwent endoscopy by Dr. Earlean Shawl which reportedly was unremarkable.   He underwent a follow-up echo Doppler study on 04/22/2016.  Ejection fraction is 60-65%.  Mild LVHwithout wall motion abnormalities.  There was grade 1 diastolic dysfunction.  He is a setting aorta was minimally icreased at 41 mm.  His mitral annulus ring/repair was intact.  There was no mention of mitral regurgion.  There was mild biatrial enlargement.  He had normal pulmonary pressures.  I had seen him in follow-up in December 2017.  I saw him in February 2019 and last saw him in December 2019. 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  He continued to exercise.  He was  not biking as much as he had in the past.  He underwent a follow-up echo Doppler study on April 20, 2018 which showed an EF of 60 to 65%.  He had normal wall motion.  His mitral valve repair was stable.  There was no significant residual mitral regurgitation.  Left atrium was mildly dilated.  He denies any chest pain.  He is unaware of any episodes of atrial fibrillation or palpitations.  He had laboratory 2 days ago.  TSH was normal.  Total  cholesterol 165, triglycerides 128, HDL 58, LDL cholesterol 81.  Electrolytes were normal.  Renal function and LFTs were normal.    I last saw him in Jan 2021 and over the prior year he continued to be stable from a cardiac standpoint.   With the COVID-19 pandemic he was not playing as much tennis that he had in the past.  He continued to exercise on exercise bike at home.  He denied chest pain or change in exercise capacity.  He was on ramipril 5 mg and metoprolol succinate 12.5 mg daily for blood pressure.  He is unaware of any cardiac arrhythmia.  He is on rosuvastatin 20 mg in addition to omega-3 fatty acid.  He continues takes aspirin and takes supplemental vitamin D.  Since I last saw him over the past 10 months he has continued to do well. He retired on 12/08/2019. Since that time he had traveled to [place] for 2 weeks. His exercise activity has increased and typically walks every morning with his wife, rides his bike in place tennis regularly. He denies any exertional symptoms. He is unaware of any abnormal heart rhythms. He denies exertional shortness of breath.  He underwent a follow-up echo Doppler study on 04/17/2020 which showed an EF of 60 to 65% with mild LVH. Aortic valve was trileaflet. His mitral valve repair was stable without MR or MS. An annuloplasty ring was present in the mitral position. He was noted to have mild dilatation of his aortic root at 41 mm and of the ascending aorta at 42 mm. He has not had recent laboratory. He presents for evaluation.  The patient does not have symptoms concerning for COVID-19 infection (fever, chills, cough, or new shortness of breath).    Past Medical History:  Diagnosis Date  . ASD (atrial septal defect) 2007   repaired with PFO in [place]  . Echocardiogram abnormal 2013   EF 89-21%, normal diastolic parameters, mild mitral regur. with his post leaflet of MV calcified  . History of stress test 2008   neg.for ischemia  . HTN (hypertension)   .  MVP (mitral valve prolapse) 2007   mitral valve repair in [place]  . Other and unspecified hyperlipidemia    Dr Ellouise Newer   Past Surgical History:  Procedure Laterality Date  . CARDIAC CATHETERIZATION  2007   EF 65-70%, significant mitral prolapse wth 4+ MR, normal coronary arteries   . COLONOSCOPY  2005 ; 2010;2014   polyp 2010 , Dr Earlean Shawl  . HERNIA REPAIR  2007  . I & D EXTREMITY     thumb, for infection  . MITRAL VALVE REPAIR  2007   Robotic surgery @ [address] [place] , [place] and ASD repair and PFO repair  . PROSTATE SURGERY  1995   TURP , Dr Serita Butcher     Current Meds  Medication Sig  . aspirin 81 MG tablet Take 81 mg by mouth daily.    . Cholecalciferol (VITAMIN D3) 2000 UNITS TABS  Take by mouth daily.  . fish oil-omega-3 fatty acids 1000 MG capsule Take by mouth daily.  . metoprolol succinate (TOPROL-XL) 25 MG 24 hr tablet Take 0.5 tablets (12.5 mg total) by mouth 2 (two) times daily.  . ramipril (ALTACE) 5 MG capsule Take 1 capsule (5 mg total) by mouth daily.  . rosuvastatin (CRESTOR) 20 MG tablet Take 1 tablet (20 mg total) by mouth at bedtime.     Allergies:   Patient has no known allergies.   Social History   Tobacco Use  . Smoking status: Former Smoker    Quit date: 06/10/1977    Years since quitting: 42.9  . Smokeless tobacco: Never Used  . Tobacco comment: from age [age], up to 1 ppd or less  Substance Use Topics  . Alcohol use: Yes    Alcohol/week: 14.0 standard drinks    Types: 14 Glasses of wine per week  . Drug use: No     Family Hx: The patient's family history includes AAA (abdominal aortic aneurysm) in his maternal grandmother; Cancer in his father; Cancer (age of onset: 39) in his mother; Hypertension in his father; Prostate cancer in his father; Stroke in his paternal grandmother. There is no history of Heart disease or Diabetes.  ROS:   Please see the history of present illness.    No fevers chills or night sweats No cough No wheezing No  abnormal heart rhythms No chest pain No abdominal pain No swelling No neurologic symptoms Sleeping well All other systems reviewed and are negative.   Prior CV studies:   The following studies were reviewed today:  ECHO: 04/17/2020 IMPRESSIONS  1. Left ventricular ejection fraction, by estimation, is 60 to 65%. The  left ventricle has normal function. The left ventricle has no regional  wall motion abnormalities. There is mild left ventricular hypertrophy.  Left ventricular diastolic parameters  are indeterminate.  2. Right ventricular systolic function is normal. The right ventricular  size is normal. Tricuspid regurgitation signal is inadequate for assessing  PA pressure.  3. The aortic valve is tricuspid. Aortic valve regurgitation is not  visualized. No aortic stenosis is present.  4. Aortic dilatation noted. There is dilatation of the ascending aorta,  measuring 42 mm. There is dilatation of the aortic root, measuring 41 mm.  5. The inferior vena cava is normal in size with greater than 50%  respiratory variability, suggesting right atrial pressure of 3 mmHg.  6. The mitral valve has been repaired. No evidence of mitral valve  regurgitation. No evidence of mitral stenosis. MG 2 mmHg at HR 67 bpm.   FINDINGS  Left Ventricle: Left ventricular ejection fraction, by estimation, is 60  to 65%. The left ventricle has normal function. The left ventricle has no  regional wall motion abnormalities. The left ventricular internal cavity  size was normal in size. There is  mild left ventricular hypertrophy. Left ventricular diastolic parameters  are indeterminate.   Right Ventricle: The right ventricular size is normal. Right vetricular  wall thickness was not assessed. Right ventricular systolic function is  normal. Tricuspid regurgitation signal is inadequate for assessing PA  pressure.   Left Atrium: Left atrial size was normal in size.   Right Atrium: Right atrial  size was normal in size.   Pericardium: There is no evidence of pericardial effusion.   Mitral Valve: The mitral valve has been repaired/replaced. No evidence of  mitral valve regurgitation. There is a prosthetic annuloplasty ring  present in the mitral position.  No evidence of mitral valve stenosis.   Tricuspid Valve: The tricuspid valve is normal in structure. Tricuspid  valve regurgitation is trivial.   Aortic Valve: The aortic valve is tricuspid. Aortic valve regurgitation is  not visualized. No aortic stenosis is present.   Pulmonic Valve: The pulmonic valve was not well visualized. Pulmonic valve  regurgitation is trivial.   Aorta: Aortic dilatation noted. There is dilatation of the ascending  aorta, measuring 42 mm. There is dilatation of the aortic root, measuring  41 mm.   Venous: The inferior vena cava is normal in size with greater than 50%  respiratory variability, suggesting right atrial pressure of 3 mmHg.   IAS/Shunts: No atrial level shunt detected by color flow Doppler.     LEFT VENTRICLE  PLAX 2D  LVIDd:     4.70 cm Diastology  LVIDs:     3.10 cm LV e' medial:  5.29 cm/s  LV PW:     1.20 cm LV E/e' medial: 16.2  LV IVS:    1.10 cm LV e' lateral:  5.51 cm/s  LVOT diam:   2.20 cm LV E/e' lateral: 15.6  LV SV:     72  LV SV Index:  33  LVOT Area:   3.80 cm     IVC  IVC diam: 1.60 cm   LEFT ATRIUM       Index    RIGHT ATRIUM      Index  LA diam:    4.50 cm 2.05 cm/m RA Pressure: 3.00 mmHg  LA Vol (A2C):  77.2 ml 35.14 ml/m RA Area:   16.70 cm  LA Vol (A4C):  56.3 ml 25.63 ml/m RA Volume:  43.40 ml 19.75 ml/m  LA Biplane Vol: 71.7 ml 32.63 ml/m  AORTIC VALVE  LVOT Vmax:  98.28 cm/s  LVOT Vmean: 64.900 cm/s  LVOT VTI:  0.190 m    AORTA  Ao Root diam: 4.10 cm  Ao Asc diam: 4.20 cm   MV E velocity: 85.94 cm/s  TRICUSPID VALVE  MV A velocity: 108.80 cm/s Estimated RAP: 3.00  mmHg  MV E/A ratio: 0.79               SHUNTS               Systemic VTI: 0.19 m               Systemic Diam: 2.20 cm   Labs/Other Tests and Data Reviewed:    No ECG was obtained today but I personally reviewed his ECG from Jan 12/2019:  ECG (independently read by me): Normal sinus rhythm at 62 bpm.  Left axis deviation.  No ectopy.  Normal intervals.  December 2019 ECG (independently read by me): Sinus bradycardia 52 bpm.  No ectopy.  Normal intervals.  July 21, 2017 ECG (independently read by me): Sinus rhythm at 66 bpm with PAC.  Borderline first-degree block with a PR interval of 22 ms  December 2017 ECG (independently read by me): sinus bradycardia 59 bpm with a PAC.  Normal intervals.  NST segment changes.  November 2016 ECG (independently read by me): Normal sinus rhythm with an isolated PAC.  Normal intervals.  No ST segment changes.  November 2015 ECG (independently read by me): NSR at 57; PR interval 220 ms.  QTc interval normal  Prior November 2014 ECG: Sinus at 71 beats per minute. No significant change  Recent Labs: 05/27/2019: ALT 13; BUN 16; Creatinine, Ser 1.04; Hemoglobin 14.7; Platelets 239.0; Potassium  4.9; Sodium 139; TSH 4.24   Recent Lipid Panel Lab Results  Component Value Date/Time   CHOL 157 05/27/2019 08:51 AM   TRIG 107.0 05/27/2019 08:51 AM   HDL 56.70 05/27/2019 08:51 AM   CHOLHDL 3 05/27/2019 08:51 AM   LDLCALC 79 05/27/2019 08:51 AM    Wt Readings from Last 3 Encounters:  04/27/20 217 lb (98.4 kg)  06/17/19 215 lb (97.5 kg)  05/27/19 214 lb (97.1 kg)      Objective:    Vital Signs:  Ht 6' (1.829 m)   Wt 217 lb (98.4 kg)   BMI 29.43 kg/m    This was a video visit. He had not taken his blood pressure recently. Breathing was normal and not labored There was no apparent JVD There was no audible wheezing He is unaware of any heart rate irregularity No chest pain His abdomen was  nontender No swelling Neurologically he appeared grossly intact Normal affect and mood  ASSESSMENT & PLAN:    1. S/P Robotic MVR (mitral valve repair) for severe MR and ASD and PFO repair: November 2007   2. Essential hypertension   3. Mild dilation of ascending aorta (HCC)   4. Hyperlipidemia, unspecified hyperlipidemia type   5. History of prostate cancer    William Lewis is a 69 year old gentleman who underwent da Vinci robotic mitral valve repair surgery on 04/10/2006 for severe mitral vegetation due to ruptured chordae tendineae and had placement of 4 new Gore-Tex chordae 2 from the medial, 2 from the lateral muscle which were brought to the edges of the posterior leaflet. He also had repair of a small atrial septal defect as well as PFO. He had normal coronary arteries preoperatively. Since his surgery 14 years ago he has continued to do exceptionally well. He remains very active and consistently had ridden his bike and play tennis. Now that he is retired he is walking on a daily basis in addition to continuing to ride his bike and play tennis. He is unaware of any palpitations or arrhythmias. He denies shortness of breath or change in exercise tolerance. He has not had recent laboratory. I will recheck fasting laboratory with a comprehensive metabolic panel CBC TSH lipid studies and will also recheck his PSA due to his remote history of prostate cancer which developed at age 47 at which time he underwent surgery. I thoroughly reviewed his 2D echo Doppler study from 04/17/2020 with him in detail. His mitral valve repair remained stable. It was noted that he does have very mild dilatation of his aortic root at 41 mm and of his ascending aorta at 42 mm. This will be followed. Clinically he is stable. He will be having his blood pressure checked and report to [place] if elevated. He has continued to be on ramipril 5 mg in addition to Toprol-XL which he takes 12.5 mg twice a day. He continues to be on  rosuvastatin for hyperlipidemia. Laboratory in Dec 2020 showed an LDL cholesterol of 79. Creatinine was 1.04. I will contact him regarding the results of his laboratory and blood pressure assessment. As long as he remains stable, I will see him in the office visit in 1 year.  COVID-19 Education: The signs and symptoms of COVID-19 were discussed with the patient and how to seek care for testing (follow up with PCP or arrange E-visit).  The importance of social distancing was discussed today.  Time:   Today, I have spent 20  minutes with the patient with telehealth technology  discussing the above problems.     Medication Adjustments/Labs and Tests Ordered: Current medicines are reviewed at length with the patient today.  Concerns regarding medicines are outlined above.   Tests Ordered: Orders Placed This Encounter  Procedures  . CBC with Differential/Platelet  . TSH  . Lipid panel  . Comprehensive metabolic panel  . PSA    Medication Changes: No orders of the defined types were placed in this encounter.   Follow Up:  In Person 1 year  Signed, Shelva Majestic, MD  04/27/2020 12:24 PM    [place]

## 2020-04-27 NOTE — Patient Instructions (Signed)
Medication Instructions:  Your physician recommends that you continue on your current medications as directed. Please refer to the Current Medication list given to you today.  *If you need a refill on your cardiac medications before your next appointment, please call your pharmacy*  Lab Work: FASTING LP/CMET/CBC/TST/PSA SOON   If you have labs (blood work) drawn today and your tests are completely normal, you will receive your results only by: Marland Kitchen MyChart Message (if you have MyChart) OR . A paper copy in the mail If you have any lab test that is abnormal or we need to change your treatment, we will call you to review the results.  Testing/Procedures: NONE   Follow-Up: At [facility], you and your health needs are our priority.  As part of our continuing mission to provide you with exceptional heart care, we have created designated Provider Care Teams.  These Care Teams include your primary Cardiologist (physician) and Advanced Practice Providers (APPs -  Physician Assistants and Nurse Practitioners) who all work together to provide you with the care you need, when you need it.  We recommend signing up for the patient portal called "MyChart".  Sign up information is provided on this After Visit Summary.  MyChart is used to connect with patients for Virtual Visits (Telemedicine).  Patients are able to view lab/test results, encounter notes, upcoming appointments, etc.  Non-urgent messages can be sent to your provider as well.   To learn more about what you can do with MyChart, go to [email or web address].    Your next appointment:   12 month(s) You will receive a reminder letter in the mail two months in advance. If you don't receive a letter, please call our office to schedule the follow-up appointment.   The format for your next appointment:   In Person  Provider:   You may see DR Claiborne Billings  or one of the following Advanced Practice Providers on your designated Care Team:    Almyra Deforest,  PA-C  Fabian Sharp, PA-C or   Roby Lofts, [place]

## 2020-05-03 DIAGNOSIS — Z8546 Personal history of malignant neoplasm of prostate: Secondary | ICD-10-CM | POA: Diagnosis not present

## 2020-05-03 DIAGNOSIS — I10 Essential (primary) hypertension: Secondary | ICD-10-CM | POA: Diagnosis not present

## 2020-05-03 DIAGNOSIS — E785 Hyperlipidemia, unspecified: Secondary | ICD-10-CM | POA: Diagnosis not present

## 2020-05-03 LAB — LIPID PANEL
Chol/HDL Ratio: 3.1 ratio (ref 0.0–5.0)
Cholesterol, Total: 161 mg/dL (ref 100–199)
HDL: 52 mg/dL (ref >39)
LDL Chol Calc (NIH): 82 mg/dL (ref 0–99)
Triglycerides: 154 mg/dL — ABNORMAL HIGH (ref 0–149)
VLDL Cholesterol Cal: 27 mg/dL (ref 5–40)

## 2020-05-03 LAB — COMPREHENSIVE METABOLIC PANEL
ALT: 16 IU/L (ref 0–44)
AST: 17 IU/L (ref 0–40)
Albumin/Globulin Ratio: 2 (ref 1.2–2.2)
Albumin: 4.3 g/dL (ref 3.7–4.7)
Alkaline Phosphatase: 68 IU/L (ref 44–121)
BUN/Creatinine Ratio: 16 (ref 10–24)
BUN: 16 mg/dL (ref 8–27)
Bilirubin Total: 0.3 mg/dL (ref 0.0–1.2)
CO2: 25 mmol/L (ref 20–29)
Calcium: 9.4 mg/dL (ref 8.6–10.2)
Chloride: 102 mmol/L (ref 96–106)
Creatinine, Ser: 1.01 mg/dL (ref 0.76–1.27)
GFR calc Af Amer: 86 mL/min/{1.73_m2} (ref >59)
GFR calc non Af Amer: 74 mL/min/{1.73_m2} (ref >59)
Globulin, Total: 2.2 g/dL (ref 1.5–4.5)
Glucose: 96 mg/dL (ref 65–99)
Potassium: 5.2 mmol/L (ref 3.5–5.2)
Sodium: 138 mmol/L (ref 134–144)
Total Protein: 6.5 g/dL (ref 6.0–8.5)

## 2020-05-03 LAB — CBC WITH DIFFERENTIAL/PLATELET
Basophils Absolute: 0 10*3/uL (ref 0.0–0.2)
Basos: 1 %
EOS (ABSOLUTE): 0.2 10*3/uL (ref 0.0–0.4)
Eos: 4 %
Hematocrit: 44.8 % (ref 37.5–51.0)
Hemoglobin: 14.4 g/dL (ref 13.0–17.7)
Immature Grans (Abs): 0 10*3/uL (ref 0.0–0.1)
Immature Granulocytes: 0 %
Lymphocytes Absolute: 1.2 10*3/uL (ref 0.7–3.1)
Lymphs: 25 %
MCH: 28.7 pg (ref 26.6–33.0)
MCHC: 32.1 g/dL (ref 31.5–35.7)
MCV: 89 fL (ref 79–97)
Monocytes Absolute: 0.5 10*3/uL (ref 0.1–0.9)
Monocytes: 10 %
Neutrophils Absolute: 2.9 10*3/uL (ref 1.4–7.0)
Neutrophils: 60 %
Platelets: 245 10*3/uL (ref 150–450)
RBC: 5.02 x10E6/uL (ref 4.14–5.80)
RDW: 13.9 % (ref 11.6–15.4)
WBC: 4.8 10*3/uL (ref 3.4–10.8)

## 2020-05-03 LAB — PSA: Prostate Specific Ag, Serum: 0 ng/mL (ref 0.0–4.0)

## 2020-05-03 LAB — TSH: TSH: 5.32 u[IU]/mL — ABNORMAL HIGH (ref 0.450–4.500)

## 2020-05-19 ENCOUNTER — Encounter: Payer: Self-pay | Admitting: Family Medicine

## 2020-05-19 ENCOUNTER — Other Ambulatory Visit: Payer: Self-pay

## 2020-05-19 ENCOUNTER — Ambulatory Visit (INDEPENDENT_AMBULATORY_CARE_PROVIDER_SITE_OTHER): Payer: PPO | Admitting: Family Medicine

## 2020-05-19 VITALS — BP 142/84 | Ht 72.0 in | Wt 218.0 lb

## 2020-05-19 DIAGNOSIS — M25561 Pain in right knee: Secondary | ICD-10-CM | POA: Diagnosis not present

## 2020-05-19 NOTE — Progress Notes (Signed)
PCP: Binnie Rail, MD  Subjective:   HPI: Patient is a 69 y.o. male here for evaluation of right knee pain.  He reports that about 2 weeks ago, he was playing tennis and felt a slight pain in his posterior knee, he thought he might of strained something and took about a week off to let heal.  He is able to walk and play golf without any pain at that time, and then yesterday he returned to tennis, was playing very well until he ran to get a cross court shot.  While he was running for the ball he took a big step and reached out for for hand and felt a sudden pain in the back of his knee.  He did not notice a pop.  He felt like his knee gave out on him and he was having trouble bearing weight initially.  Since then, he has been walking with a cane.  He notes a little bit of swelling but nothing significant.  He has not noticed any instability in his knee that is aware of.  No locking, clicking or catching.  The pain is mostly in the posterior aspect of his knee radiating both proximally and distally but mostly proximally.  It is somewhat painful to extend his knee and hold in extension.  The most painful thing is to bear weight on his knee.  He has not noticed any significant weakness, numbness, tingling.  Review of Systems:  Per HPI.   [place], medications and smoking status reviewed.      Objective:  Physical Exam:  [place] Adult Exercise 05/19/2020  Frequency of aerobic exercise (# of days/week) 6  Average time in minutes 60  Frequency of strengthening activities (# of days/week) 0     Gen: awake, alert, NAD, comfortable in exam room Pulm: breathing unlabored  Right knee  Inspection: Bilateral knees without evidence of erythema, ecchymosis, swelling, edema.  Perhaps a very trace effusion present. Active ROM: Intact. 0-150d.  Some pain with full extension. Strength: 5/5 strength to resisted flexion/extension without pain.  Minimal pain with resisted flexion with the foot  rotated internally, neutral and externally.  Pain mostly can with internal rotation. Patella: Negative patellar grind. No patellar facet tenderness. No apprehension. No proximal or distal patellar tendon tenderness to palpation. No quad tendon tenderness to palpation.  Tibia: No tibial plateau, tibial tuberosity tenderness.  Joint line: No joint line tenderness.  Popliteal: Mild popliteal tenderness to the insertional gastroc. No insertional biceps femoris, semimembranosis, semitendinosis tenderness.  Thessaly's: Positive significantly for pain with very minimal rotation. Lachmans: Stable bilaterally with firm endpoint  Anterior/Posterior drawer: Stable bilaterally Varus/valgus stress at 0, 15d: Negative for pain, laxity  Ultrasound examination of the right knee: -Suprapatellar pouch: Visualized in both long and short axis and with minimal effusion -Quadriceps tendon: Insertion onto superior pole patella visualized and without significant abnormality -Patella: Well visualized and without any signs of prepatellar bursitis or patellar fracture. -Patellar tendon: Well visualized at its origin on the inferior pole of the patella and distally to its insertion on the tibial tuberosity.  No abnormalities noted. -Medial joint line: Medial meniscus visualized and without any obvious abnormalities, preserved joint space, MCL visualized and without abnormalities. -Lateral joint line: Lateral meniscus visualized and without any obvious abnormalities, preserved joint space, LCL visualized and without abnormalities. -Posterior knee: Medial and lateral heads of the gastrocnemius well visualized, neurovascular bundle visualized and without any abnormalities.  No signs of popliteal cyst.  He  does have an area of hyperechogenicity between the heads of the proximal gastrocs.  Impression: Mildly hyperintense signal in the posterior knee without any significant tears     Assessment & Plan:  1.  Right knee pain,  acute Patient with onset of right knee pain yesterday after reaching and pivoting while playing tennis.  Differential includes hamstring tear, gastroc tear, posterior meniscus tear.  Given the location of his pain in the posterior aspect of his knee, it is possible this is simply an hamstring strain, though posterior meniscus tear cannot be ruled out.    Plan: -Conservative management for now with compression, relative rest, NSAIDs -Return in 2 weeks, if not significantly improved would consider MRI for evaluation of meniscus tear   Dagoberto Ligas, MD [facility] Sports Medicine Fellow 05/19/2020 9:21 AM

## 2020-05-19 NOTE — Progress Notes (Signed)
[facility]: Attending Note: I have reviewed the chart, discussed wit the Sports Medicine Fellow. I agree with assessment and treatment plan as detailed in the [place] note.   Ultrasound imaging is nondiagnostic but it does rule out significant hamstring tear.  Also there is not any significant fluid in the popliteal space or the suprapatellar pouch so I think strain and/or mild meniscal injury is probably the most consistent diagnosis. We will try conservative therapy with knee compression sleeve icing and rest.  Follow-up 1 to 2 weeks and certainly in the interim with any new or worsening symptoms.  I think this will likely be totally resolve on its own but it may take 6 weeks or so.

## 2020-05-28 NOTE — Patient Instructions (Addendum)
Blood work was ordered.     No immunization administered today.   Medications changes include :   none    Please followup in 1 year    Health Maintenance, Male Adopting a healthy lifestyle and getting preventive care are important in promoting health and wellness. Ask your health care provider about:  The right schedule for you to have regular tests and exams.  Things you can do on your own to prevent diseases and keep yourself healthy. What should I know about diet, weight, and exercise? Eat a healthy diet   Eat a diet that includes plenty of vegetables, fruits, low-fat dairy products, and lean protein.  Do not eat a lot of foods that are high in solid fats, added sugars, or sodium. Maintain a healthy weight Body mass index (BMI) is a measurement that can be used to identify possible weight problems. It estimates body fat based on height and weight. Your health care provider can help determine your BMI and help you achieve or maintain a healthy weight. Get regular exercise Get regular exercise. This is one of the most important things you can do for your health. Most adults should:  Exercise for at least 150 minutes each week. The exercise should increase your heart rate and make you sweat (moderate-intensity exercise).  Do strengthening exercises at least twice a week. This is in addition to the moderate-intensity exercise.  Spend less time sitting. Even light physical activity can be beneficial. Watch cholesterol and blood lipids Have your blood tested for lipids and cholesterol at 71 years of age, then have this test every 5 years. You may need to have your cholesterol levels checked more often if:  Your lipid or cholesterol levels are high.  You are older than 71 years of age.  You are at high risk for heart disease. What should I know about cancer screening? Many types of cancers can be detected early and may often be prevented. Depending on your health history  and family history, you may need to have cancer screening at various ages. This may include screening for:  Colorectal cancer.  Prostate cancer.  Skin cancer.  Lung cancer. What should I know about heart disease, diabetes, and high blood pressure? Blood pressure and heart disease  High blood pressure causes heart disease and increases the risk of stroke. This is more likely to develop in people who have high blood pressure readings, are of African descent, or are overweight.  Talk with your health care provider about your target blood pressure readings.  Have your blood pressure checked: ? Every 3-5 years if you are 18-39 years of age. ? Every year if you are 40 years old or older.  If you are between the ages of 65 and 75 and are a current or former smoker, ask your health care provider if you should have a one-time screening for abdominal aortic aneurysm (AAA). Diabetes Have regular diabetes screenings. This checks your fasting blood sugar level. Have the screening done:  Once every three years after age 45 if you are at a normal weight and have a low risk for diabetes.  More often and at a younger age if you are overweight or have a high risk for diabetes. What should I know about preventing infection? Hepatitis B If you have a higher risk for hepatitis B, you should be screened for this virus. Talk with your health care provider to find out if you are at risk for hepatitis B infection. Hepatitis C   Blood testing is recommended for:  Everyone born from 1945 through 1965.  Anyone with known risk factors for hepatitis C. Sexually transmitted infections (STIs)  You should be screened each year for STIs, including gonorrhea and chlamydia, if: ? You are sexually active and are younger than 71 years of age. ? You are older than 71 years of age and your health care provider tells you that you are at risk for this type of infection. ? Your sexual activity has changed since you were  last screened, and you are at increased risk for chlamydia or gonorrhea. Ask your health care provider if you are at risk.  Ask your health care provider about whether you are at high risk for HIV. Your health care provider may recommend a prescription medicine to help prevent HIV infection. If you choose to take medicine to prevent HIV, you should first get tested for HIV. You should then be tested every 3 months for as long as you are taking the medicine. Follow these instructions at home: Lifestyle  Do not use any products that contain nicotine or tobacco, such as cigarettes, e-cigarettes, and chewing tobacco. If you need help quitting, ask your health care provider.  Do not use street drugs.  Do not share needles.  Ask your health care provider for help if you need support or information about quitting drugs. Alcohol use  Do not drink alcohol if your health care provider tells you not to drink.  If you drink alcohol: ? Limit how much you have to 0-2 drinks a day. ? Be aware of how much alcohol is in your drink. In the [place] one drink equals one 12 oz bottle of beer (355 mL), one 5 oz glass of wine (148 mL), or one 1 oz glass of hard liquor (44 mL). General instructions  Schedule regular health, dental, and eye exams.  Stay current with your vaccines.  Tell your health care provider if: ? You often feel depressed. ? You have ever been abused or do not feel safe at home. Summary  Adopting a healthy lifestyle and getting preventive care are important in promoting health and wellness.  Follow your health care provider's instructions about healthy diet, exercising, and getting tested or screened for diseases.  Follow your health care provider's instructions on monitoring your cholesterol and blood pressure. This information is not intended to replace advice given to you by your health care provider. Make sure you discuss any questions you have with your health care  provider. Document Revised: 05/20/2018 Document Reviewed: 05/20/2018 Elsevier Patient Education  2020 Elsevier Inc.  

## 2020-05-28 NOTE — Progress Notes (Signed)
Subjective:    Patient ID: William Lewis, male    DOB: 12-11-1950, 69 y.o.   MRN: [id number]  HPI He is here for a physical exam.    He plans on losing weight this year.      Medications and allergies reviewed with patient and updated if appropriate.  Patient Active Problem List   Diagnosis Date Noted  . Hyperglycemia 05/29/2020  . Subclinical hypothyroidism 05/29/2020  . Schatzki's ring 10/17/2017  . S/P MVR (mitral valve repair),2007 along with ASD repair 04/26/2013  . Hx of colonic polyps 04/14/2013  . Vitamin D deficiency 03/30/2012  . Essential hypertension 06/29/2010  . Hyperlipidemia 10/21/2007  . MITRAL REGURGITATION 10/21/2007  . HX, PERSONAL, MALIGNANCY, PROSTATE 10/23/2006    Current Outpatient Medications on File Prior to Visit  Medication Sig Dispense Refill  . aspirin 81 MG tablet Take 81 mg by mouth daily.    . Cholecalciferol (VITAMIN D3) 2000 UNITS TABS Take by mouth daily.    . fish oil-omega-3 fatty acids 1000 MG capsule Take by mouth daily.    . metoprolol succinate (TOPROL-XL) 25 MG 24 hr tablet Take 0.5 tablets (12.5 mg total) by mouth 2 (two) times daily. 90 tablet 3  . ramipril (ALTACE) 5 MG capsule Take 1 capsule (5 mg total) by mouth daily. 90 capsule 3  . rosuvastatin (CRESTOR) 20 MG tablet Take 1 tablet (20 mg total) by mouth at bedtime. 90 tablet 3   No current facility-administered medications on file prior to visit.    Past Medical History:  Diagnosis Date  . ASD (atrial septal defect) 2007   repaired with PFO in [place]  . Echocardiogram abnormal 2013   EF 27-03%, normal diastolic parameters, mild mitral regur. with his post leaflet of MV calcified  . History of stress test 2008   neg.for ischemia  . HTN (hypertension)   . MVP (mitral valve prolapse) 2007   mitral valve repair in [place]  . Other and unspecified hyperlipidemia    Dr Ellouise Newer    Past Surgical History:  Procedure Laterality Date  . CARDIAC CATHETERIZATION   2007   EF 65-70%, significant mitral prolapse wth 4+ MR, normal coronary arteries   . COLONOSCOPY  2005 ; 2010;2014   polyp 2010 , Dr Earlean Shawl  . HERNIA REPAIR  2007  . I & D EXTREMITY     thumb, for infection  . MITRAL VALVE REPAIR  2007   Robotic surgery @ [address] [place] , [place] and ASD repair and PFO repair  . PROSTATE SURGERY  1995   TURP , Dr Serita Butcher    Social History   Socioeconomic History  . Marital status: Married    Spouse name: Not on file  . Number of children: Not on file  . Years of education: Not on file  . Highest education level: Not on file  Occupational History  . Not on file  Tobacco Use  . Smoking status: Former Smoker    Quit date: 06/10/1977    Years since quitting: 42.9  . Smokeless tobacco: Never Used  . Tobacco comment: from age [age], up to 1 ppd or less  Substance and Sexual Activity  . Alcohol use: Yes    Alcohol/week: 14.0 standard drinks    Types: 14 Glasses of wine per week  . Drug use: No  . Sexual activity: Not on file  Other Topics Concern  . Not on file  Social History Narrative   Gets reg exercise  Social Determinants of Health   Financial Resource Strain: Not on file  Food Insecurity: Not on file  Transportation Needs: Not on file  Physical Activity: Not on file  Stress: Not on file  Social Connections: Not on file    Family History  Problem Relation Age of Onset  . Hypertension Father   . Cancer Father        throat ; smoker  . Prostate cancer Father   . Stroke Paternal Grandmother        late 51s  . AAA (abdominal aortic aneurysm) Maternal Grandmother   . Cancer Mother 19       ? intra-abdominal  . Heart disease Neg Hx   . Diabetes Neg Hx     Review of Systems  Constitutional: Negative for chills and fever.  Eyes: Negative for visual disturbance.  Respiratory: Negative for cough, shortness of breath and wheezing.   Cardiovascular: Negative for chest pain, palpitations and leg swelling.  Gastrointestinal:  Negative for abdominal pain, blood in stool, constipation, diarrhea and nausea.       Occ gerd  Genitourinary: Negative for difficulty urinating, dysuria and hematuria.  Musculoskeletal: Negative for arthralgias and back pain.       Hamstring strain - improving  Skin: Negative for color change and rash.       Left cheek - area of concern - no change  Neurological: Negative for dizziness, light-headedness and headaches.  Psychiatric/Behavioral: Negative for dysphoric mood. The patient is nervous/anxious (mild).        Objective:   Vitals:   05/29/20 1019  BP: 140/82  Pulse: 72  Temp: 98.2 F (36.8 C)  SpO2: 96%   Filed Weights   05/29/20 1019  Weight: 224 lb (101.6 kg)   Body mass index is 30.38 kg/m.  BP Readings from Last 3 Encounters:  05/29/20 140/82  05/19/20 (!) 142/84  06/17/19 (!) 152/90    Wt Readings from Last 3 Encounters:  05/29/20 224 lb (101.6 kg)  05/19/20 218 lb (98.9 kg)  04/27/20 217 lb (98.4 kg)     Physical Exam Constitutional: He appears well-developed and well-nourished. No distress.  HENT:  Head: Normocephalic and atraumatic.  Right Ear: External ear normal.  Left Ear: External ear normal.  Mouth/Throat: Oropharynx is clear and moist.  Normal ear canals and TM b/l  Eyes: Conjunctivae and EOM are normal.  Neck: Neck supple. No tracheal deviation present. No thyromegaly present.  No carotid bruit  Cardiovascular: Normal rate, regular rhythm, normal heart sounds and intact distal pulses.   No murmur heard. Pulmonary/Chest: Effort normal and breath sounds normal. No respiratory distress. He has no wheezes. He has no rales.  Abdominal: Soft. He exhibits no distension. There is no tenderness.  Genitourinary: deferred  Musculoskeletal: He exhibits no edema.  Lymphadenopathy:   He has no cervical adenopathy.  Skin: Skin is warm and dry. He is not diaphoretic.  Psychiatric: He has a normal mood and affect. His behavior is normal.          Assessment & Plan:   Physical exam: Screening blood work  ordered Immunizations  All up to date Colonoscopy   Up to date  Eye exams   Up to date  Exercise   Regular - walking, tennis, golf, bike Weight  Plans on working on weight loss this next year Substance abuse   none  See Problem List for Assessment and Plan of chronic medical problems.   This visit occurred during the SARS-CoV-2 public  health emergency.  Safety protocols were in place, including screening questions prior to the visit, additional usage of staff PPE, and extensive cleaning of exam room while observing appropriate contact time as indicated for disinfecting solutions.

## 2020-05-29 ENCOUNTER — Encounter: Payer: Self-pay | Admitting: Internal Medicine

## 2020-05-29 ENCOUNTER — Other Ambulatory Visit: Payer: Self-pay

## 2020-05-29 ENCOUNTER — Ambulatory Visit (INDEPENDENT_AMBULATORY_CARE_PROVIDER_SITE_OTHER): Payer: PPO | Admitting: Internal Medicine

## 2020-05-29 VITALS — BP 140/82 | HR 72 | Temp 98.2°F | Ht 72.0 in | Wt 224.0 lb

## 2020-05-29 DIAGNOSIS — Z8546 Personal history of malignant neoplasm of prostate: Secondary | ICD-10-CM | POA: Diagnosis not present

## 2020-05-29 DIAGNOSIS — E782 Mixed hyperlipidemia: Secondary | ICD-10-CM | POA: Diagnosis not present

## 2020-05-29 DIAGNOSIS — I10 Essential (primary) hypertension: Secondary | ICD-10-CM | POA: Diagnosis not present

## 2020-05-29 DIAGNOSIS — Z0000 Encounter for general adult medical examination without abnormal findings: Secondary | ICD-10-CM | POA: Diagnosis not present

## 2020-05-29 DIAGNOSIS — R739 Hyperglycemia, unspecified: Secondary | ICD-10-CM | POA: Diagnosis not present

## 2020-05-29 DIAGNOSIS — E038 Other specified hypothyroidism: Secondary | ICD-10-CM

## 2020-05-29 DIAGNOSIS — R7303 Prediabetes: Secondary | ICD-10-CM | POA: Insufficient documentation

## 2020-05-29 DIAGNOSIS — E119 Type 2 diabetes mellitus without complications: Secondary | ICD-10-CM | POA: Insufficient documentation

## 2020-05-29 LAB — T3, FREE: T3, Free: 3.2 pg/mL (ref 2.3–4.2)

## 2020-05-29 LAB — TSH: TSH: 5.9 u[IU]/mL — ABNORMAL HIGH (ref 0.35–4.50)

## 2020-05-29 LAB — T4, FREE: Free T4: 0.76 ng/dL (ref 0.60–1.60)

## 2020-05-29 LAB — HEMOGLOBIN A1C: Hgb A1c MFr Bld: 6.3 % (ref 4.6–6.5)

## 2020-05-29 NOTE — Assessment & Plan Note (Addendum)
Chronic recent PSA wnl

## 2020-05-29 NOTE — Assessment & Plan Note (Signed)
Chronic BP well controlled overall Managed by cardiology Continue metoprolol XK 12. 5 mg BID, ramipril  5 daily

## 2020-05-29 NOTE — Assessment & Plan Note (Signed)
Chronic  Clinically euthyroid Check tsh, FT4, FT3

## 2020-05-29 NOTE — Assessment & Plan Note (Signed)
Chronic Check lipid panel  Continue crestor 20 mg daily Managed by cardiology Regular exercise and healthy diet encouraged

## 2020-05-29 NOTE — Assessment & Plan Note (Signed)
Check a1c 

## 2020-05-29 NOTE — Addendum Note (Signed)
Addended by: Boris Lown B on: 05/29/2020 11:12 AM   Modules accepted: Orders

## 2020-05-30 ENCOUNTER — Encounter: Payer: Self-pay | Admitting: Internal Medicine

## 2020-05-30 NOTE — Addendum Note (Signed)
Addended by: Binnie Rail on: 05/30/2020 07:37 AM   Modules accepted: Orders

## 2020-06-01 ENCOUNTER — Other Ambulatory Visit: Payer: Self-pay

## 2020-06-01 ENCOUNTER — Ambulatory Visit: Payer: PPO | Admitting: Sports Medicine

## 2020-06-01 DIAGNOSIS — S76319D Strain of muscle, fascia and tendon of the posterior muscle group at thigh level, unspecified thigh, subsequent encounter: Secondary | ICD-10-CM | POA: Diagnosis not present

## 2020-06-01 NOTE — Assessment & Plan Note (Signed)
Reexamination today shows improvement and ultrasound is suggestive of biceps femoris strain  Given Askling HS exercises to do daily Advised to wait 1 week and then he can start some easy tennis strokes At 2 weeks of pain has resolved he can start playing Progressively increase his walking  Recheck if not responding

## 2020-06-01 NOTE — Progress Notes (Signed)
CC: Right posterior knee pain  F/U from 12/10 ? At that time of posterior strain vs. Degenerative meniscus Had meniscal type sxs with giving way Ultrasound at that visit did not show any meniscal damage and some possible hyperechoic change near the biceps femoris tendon  First 2 to 3 days he needed to walk with a cane He has had significant improvement each day since Now he is walking a mile without pain but is not trying to play tennis  Review of systems No swelling No giving way No locking  Physical exam Pleasant older male in no acute distress.vs BP 138/80   Ht 6' (1.829 m)   Wt 217 lb (98.4 kg)   BMI 29.43 kg/m  [facility] Exercise 05/19/2020  Frequency of aerobic exercise (# of days/week) 6  Average time in minutes 60  Frequency of strengthening activities (# of days/week) 0   Knee: Normal to inspection with no erythema or effusion or obvious bony abnormalities. Palpation normal with no warmth or joint line tenderness or patellar tenderness or condyle tenderness. ROM normal in flexion and extension and lower leg rotation. Ligaments with solid consistent endpoints including ACL, PCL, LCL, MCL. Negative Mcmurray's and provocative meniscal tests. Non painful patellar compression. Patellar and quadriceps tendons unremarkable. Hamstring and quadriceps strength is normal.  Mild tenderness to palpation at the biceps femoris attachment to the fibula  Ultrasound of right knee No suprapatellar pouch effusion Normal quad and patellar tendons Menisci appear normal Slight hypoechoic change at the insertion of the biceps femoris No other abnormalities  Impression distal biceps femoris strain improving  Ultrasound and interpretation by Wolfgang Phoenix. Oneida Alar, MD

## 2020-06-20 DIAGNOSIS — D225 Melanocytic nevi of trunk: Secondary | ICD-10-CM | POA: Diagnosis not present

## 2020-06-20 DIAGNOSIS — L570 Actinic keratosis: Secondary | ICD-10-CM | POA: Diagnosis not present

## 2020-06-20 DIAGNOSIS — I788 Other diseases of capillaries: Secondary | ICD-10-CM | POA: Diagnosis not present

## 2020-06-20 DIAGNOSIS — Z85828 Personal history of other malignant neoplasm of skin: Secondary | ICD-10-CM | POA: Diagnosis not present

## 2020-06-20 DIAGNOSIS — L821 Other seborrheic keratosis: Secondary | ICD-10-CM | POA: Diagnosis not present

## 2020-06-20 DIAGNOSIS — D1801 Hemangioma of skin and subcutaneous tissue: Secondary | ICD-10-CM | POA: Diagnosis not present

## 2020-06-20 DIAGNOSIS — L814 Other melanin hyperpigmentation: Secondary | ICD-10-CM | POA: Diagnosis not present

## 2020-06-20 DIAGNOSIS — D2271 Melanocytic nevi of right lower limb, including hip: Secondary | ICD-10-CM | POA: Diagnosis not present

## 2020-09-01 ENCOUNTER — Other Ambulatory Visit: Payer: Self-pay

## 2020-09-01 ENCOUNTER — Ambulatory Visit: Payer: PPO | Admitting: Family Medicine

## 2020-09-01 DIAGNOSIS — S76319D Strain of muscle, fascia and tendon of the posterior muscle group at thigh level, unspecified thigh, subsequent encounter: Secondary | ICD-10-CM

## 2020-09-01 NOTE — Patient Instructions (Signed)
It was great to meet you today! Thank you for letting me participate in your care!  Today, we discussed your hamstring strain which is getting better. Please continue your home therapy and avoid activity that causes pain. Advil is good to take for pain and inflammation. Your ultrasound is reassuring today.  Follow up with [place] in 4 weeks if you are still having difficulty.  Be well, Harolyn Rutherford, DO PGY-4, [facility] [place]

## 2020-09-01 NOTE — Assessment & Plan Note (Signed)
He is improving although not as fast as he would like.  We discussed options including physical therapy, in addition of nitroglycerin patch etc.  Ultimately he decided to continue with a home exercise plan as he has been doing and follow-up in 3 to 4 weeks.  If he is not having improvement at that time we will reconsider other options.  Should he have any new or worsening symptoms in the interim he will give [place] call.

## 2020-09-01 NOTE — Progress Notes (Signed)
  DEMARIE HYNEMAN - 70 y.o. male MRN [id number]  Date of birth: July 11, 1950    SUBJECTIVE:      Chief Complaint:/ HPI:  Follow-up of right hamstring strain.  He is 50 to 75% better.  He is able to do many activities but does not quite have the confidence in his leg that he is able to play at his previous level of tenderness.  He has been adherent to the home exercise program.  He is a little frustrated as he was hoping to be back to full baseline at this point.  No more swelling.    OBJECTIVE: BP 130/80   Pulse 60   Physical Exam:  Vital signs are reviewed. GENERAL: Well-developed male no acute distress THIGH, right: Mild tenderness to palpation in the distal muscle belly but there is no tenderness to palpation of the biceps femoris hamstring.  ASSESSMENT & PLAN:  See problem based charting & AVS for pt instructions. No problem-specific Assessment & Plan notes found for this encounter.

## 2020-09-23 ENCOUNTER — Encounter: Payer: Self-pay | Admitting: Internal Medicine

## 2020-09-28 ENCOUNTER — Ambulatory Visit: Payer: PPO | Admitting: Sports Medicine

## 2020-09-28 ENCOUNTER — Other Ambulatory Visit: Payer: Self-pay

## 2020-09-28 DIAGNOSIS — S76319D Strain of muscle, fascia and tendon of the posterior muscle group at thigh level, unspecified thigh, subsequent encounter: Secondary | ICD-10-CM | POA: Diagnosis not present

## 2020-09-28 NOTE — Assessment & Plan Note (Signed)
He has continued to improve but needs to add some strength work to gain confidence Dynamic exercises Strength exercises Cont. Askling HEP 3 x week  RTC in 6 weeks if not resolved

## 2020-09-28 NOTE — Progress Notes (Signed)
RT distal HS strain at insertion of BF  Now 4 months Playing tennis 2x week Golf 2x week Biking ~ 3 days  Feels 80% better but still not as confident in stressing this leg Had done HEP and feels stronger  ROS No swelling of knee No rest or nighttime pain  PE Older M in NAD BP 112/72   Ht 6' (1.829 m)   Wt 209 lb (94.8 kg)   BMI 28.35 kg/m  [facility] Exercise 05/19/2020  Frequency of aerobic exercise (# of days/week) 6  Average time in minutes 60  Frequency of strengthening activities (# of days/week) 0   Knee: RT Normal to inspection with no erythema or effusion or obvious bony abnormalities. Palpation normal with no warmth or joint line tenderness or patellar tenderness or condyle tenderness. ROM normal in flexion and extension and lower leg rotation. Ligaments with solid consistent endpoints including ACL, PCL, LCL, MCL. Negative Mcmurray's and provocative meniscal tests. Non painful patellar compression. Patellar and quadriceps tendons unremarkable. Hamstring and quadriceps strength is normal.  Hamstring test shows that flexibility within 5 deg of left Strength testing is normal No TTP

## 2020-09-28 NOTE — Patient Instructions (Signed)
Your hamstring strength has improved a lot!!  You need confidence so let's add some dynamic drills: Volley drills to each side and with cross over Do some lateral shuffles Do some first strike moves after a serve  Add some strength work 2 to 3 days per week Hops Lunges Hamstring curls with weight  Keep up the diver, extender and glider (Askling exercises)  Definitely a slow injury and you are on track.

## 2020-10-25 ENCOUNTER — Telehealth: Payer: Self-pay

## 2020-10-25 NOTE — Telephone Encounter (Signed)
Contacted patient to schedule AWV and physical. Discussed what to expect from the AWV.

## 2020-11-14 DIAGNOSIS — H6121 Impacted cerumen, right ear: Secondary | ICD-10-CM | POA: Diagnosis not present

## 2020-12-06 ENCOUNTER — Encounter: Payer: Self-pay | Admitting: Internal Medicine

## 2020-12-07 ENCOUNTER — Other Ambulatory Visit (INDEPENDENT_AMBULATORY_CARE_PROVIDER_SITE_OTHER): Payer: PPO

## 2020-12-07 DIAGNOSIS — R739 Hyperglycemia, unspecified: Secondary | ICD-10-CM | POA: Diagnosis not present

## 2020-12-07 DIAGNOSIS — E038 Other specified hypothyroidism: Secondary | ICD-10-CM | POA: Diagnosis not present

## 2020-12-07 LAB — TSH: TSH: 3.78 u[IU]/mL (ref 0.35–5.50)

## 2020-12-07 LAB — HEMOGLOBIN A1C: Hgb A1c MFr Bld: 6.4 % (ref 4.6–6.5)

## 2020-12-08 ENCOUNTER — Encounter: Payer: Self-pay | Admitting: Internal Medicine

## 2020-12-08 LAB — THYROID ANTIBODIES
Thyroglobulin Ab: <1 IU/mL (ref <=1)
Thyroperoxidase Ab SerPl-aCnc: 1 IU/mL (ref <9)

## 2021-02-19 ENCOUNTER — Encounter: Payer: Self-pay | Admitting: Internal Medicine

## 2021-03-21 ENCOUNTER — Other Ambulatory Visit: Payer: Self-pay | Admitting: Cardiovascular Disease

## 2021-03-21 ENCOUNTER — Encounter: Payer: Self-pay | Admitting: Internal Medicine

## 2021-05-08 ENCOUNTER — Other Ambulatory Visit: Payer: Self-pay

## 2021-05-08 ENCOUNTER — Ambulatory Visit: Payer: PPO | Admitting: Cardiovascular Disease

## 2021-05-08 ENCOUNTER — Encounter: Payer: Self-pay | Admitting: Cardiovascular Disease

## 2021-05-08 DIAGNOSIS — I10 Essential (primary) hypertension: Secondary | ICD-10-CM | POA: Diagnosis not present

## 2021-05-08 DIAGNOSIS — E782 Mixed hyperlipidemia: Secondary | ICD-10-CM

## 2021-05-08 DIAGNOSIS — Z9889 Other specified postprocedural states: Secondary | ICD-10-CM | POA: Diagnosis not present

## 2021-05-08 DIAGNOSIS — Z8774 Personal history of (corrected) congenital malformations of heart and circulatory system: Secondary | ICD-10-CM

## 2021-05-08 DIAGNOSIS — Z8546 Personal history of malignant neoplasm of prostate: Secondary | ICD-10-CM

## 2021-05-08 DIAGNOSIS — R7303 Prediabetes: Secondary | ICD-10-CM

## 2021-05-08 DIAGNOSIS — I77810 Thoracic aortic ectasia: Secondary | ICD-10-CM

## 2021-05-08 NOTE — Patient Instructions (Signed)
Medication Instructions:  Your Physician recommend you continue on your current medication as directed.    *If you need a refill on your cardiac medications before your next appointment, please call your pharmacy*   Lab Work: None ordered today   Testing/Procedures: Your physician has requested that you have an echocardiogram in 1 year. Echocardiography is a painless test that uses sound waves to create images of your heart. It provides your doctor with information about the size and shape of your heart and how well your heart's chambers and valves are working. This procedure takes approximately one hour. There are no restrictions for this procedure. [place] [address]    Follow-Up: At [facility], you and your health needs are our priority.  As part of our continuing mission to provide you with exceptional heart care, we have created designated Provider Care Teams.  These Care Teams include your primary Cardiologist (physician) and Advanced Practice Providers (APPs -  Physician Assistants and Nurse Practitioners) who all work together to provide you with the care you need, when you need it.  We recommend signing up for the patient portal called "MyChart".  Sign up information is provided on this After Visit Summary.  MyChart is used to connect with patients for Virtual Visits (Telemedicine).  Patients are able to view lab/test results, encounter notes, upcoming appointments, etc.  Non-urgent messages can be sent to your provider as well.   To learn more about what you can do with MyChart, go to [email or web address].    Your next appointment:   1 year(s)  The format for your next appointment:   In Person  Provider:   Shelva Majestic, MD

## 2021-05-08 NOTE — Progress Notes (Signed)
Patient ID: William Lewis, male   DOB: 1951-04-08, 70 y.o.   MRN: [id number]     HPI: William Lewis is a 70 y.o. male who presents to the office for a 13 month cardiology followup evaluation.  [name] underwent mitral valve repair robotically by Dr. Elveria Rising [facility] a 04/10/2006 for significant mitral valve prolapse with partial flail posterior P2 leaflet. He had 4+ preoperative mitral regurgitation and also had an ASD as well as a patent foramen ovale which were also successfully repaired. At the time of his surgery, his left atrial appendage was closed.  Preoperatively, he was documented to have normal coronary arteries by cardiac catheterization. Last year he underwent a 18 month followup echo Doppler study which continued to show excellent result. Left ventricular ejection fraction was 60-65% and he did not have regional wall motion abnormalities. His mitral annular ring prosthesis was present. There is no mention in his current echo of any residual mitral regurgitation whereas on his prior echo in 2013 he was felt to have mild MR. All other valvular anatomy was normal.  He has a remote history of prostate CA which developed at age 70 at which time he underwent surgery.  When I saw him in 2016  he was continuing to be active and had a good year.,he was playing teis regularly and riding his bike. He had lost his brother and is mother which had led to some increased stress. He continues to do well.  He is still working at least 40-50 hours per week.  He is biking less, but playing tennis and golf without cardiovascular restrictions. He is unaware of any palpitations. He denies any chest pain.  He denies presyncope or syncope.  In June 2017 he presented to the emergency room after a piece of chicken had gotten stuck in his esophagus.  He required IV glucagon and subsequently underwent endoscopy by Dr. Earlean Shawl which reportedly was unremarkable.   He underwent a follow-up echo Doppler  study on 04/22/2016.  Ejection fraction is 60-65%.  Mild LVHwithout wall motion abnormalities.  There was grade 1 diastolic dysfunction.  He is a setting aorta was minimally icreased at 41 mm.  His mitral annulus ring/repair was intact.  There was no mention of mitral regurgion.  There was mild biatrial enlargement.  He had normal pulmonary pressures.  I had seen him in follow-up in December 2017.  I saw him in February 2019 and last saw him in December 2019. 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  He continued to exercise.  He was not biking as much as he had in the past.  He underwent a follow-up echo Doppler study on April 20, 2018 which showed an EF of 60 to 65%.  He had normal wall motion.  His mitral valve repair was stable.  There was no significant residual mitral regurgitation.  Left atrium was mildly dilated.  He denies any chest pain.  He is unaware of any episodes of atrial fibrillation or palpitations.  He had laboratory 2 days ago.  TSH was normal.  Total cholesterol 165, triglycerides 128, HDL 58, LDL cholesterol 81.  Electrolytes were normal.  Renal function and LFTs were normal.    I saw him in Jan 2021 and over the prior year he continued to be stable from a cardiac standpoint.   With the COVID-19 pandemic he was not playing as much  tennis that he had in the past.  He continued to exercise on exercise bike at home.  He denied chest pain or change in exercise capacity.  He was on ramipril 5 mg and metoprolol succinate 12.5 mg daily for blood pressure.  He is unaware of any cardiac arrhythmia.  He is on rosuvastatin 20 mg in addition to omega-3 fatty acid.  He continues takes aspirin and takes supplemental vitamin D.   I saw him in a video telemedicine evaluation on 04/27/2020. Since his prior evaluation he continued to do well. He retired on 12/08/2019. Since that time he had traveled to [place] for 2  weeks. His exercise activity has increased and typically walks every morning with his wife, rides his bike in place tennis regularly. He denies any exertional symptoms. He is unaware of any abnormal heart rhythms. He denies exertional shortness of breath.   I last evaluated him in a telemedicine encounter on April 27, 2020.  Prior to that evaluation he had undergone a follow-up echo Doppler study on 04/17/2020 which showed an EF of 60 to 65% with mild LVH. Aortic valve was trileaflet. His mitral valve repair was stable without MR or MS. An annuloplasty ring was present in the mitral position. He was noted to have mild dilatation of his aortic root at 41 mm and of the ascending aorta at 42 mm.  Laboratory was ordered from that office visit.  Total cholesterol was 161, HDL 52, LDL 82, and triglycerides 1 54.  He has been documented to have prediabetes and is followed by Dr. Billey Gosling.  Since I last saw him, he has continued to be stable.  He exercises regularly mostly.  He typically plays tennis several days per week and also exercises on his bike.  His weight has been stable.  He has had recent purposeful weight loss through increased activity and since he has retired he is lost over 11 pounds.  He denies chest pain PND orthopnea.  He denies palpitations.  He continues to be on metoprolol succinate 12.5 mg twice a day and ramipril 5 mg.  He is on rosuvastatin 20 mg daily and takes a daily aspirin in addition to omega-3 fatty acid.  He presents for evaluation.   Past Medical History:  Diagnosis Date   ASD (atrial septal defect) 2007   repaired with PFO in [facility]   Echocardiogram abnormal 2013   EF 76-19%, normal diastolic parameters, mild mitral regur. with his post leaflet of MV calcified   History of stress test 2008   neg.for ischemia   HTN (hypertension)    MVP (mitral valve prolapse) 2007   mitral valve repair in [place]   Other and unspecified hyperlipidemia    Dr Ellouise Newer    Past  Surgical History:  Procedure Laterality Date   CARDIAC CATHETERIZATION  2007   EF 65-70%, significant mitral prolapse wth 4+ MR, normal coronary arteries    COLONOSCOPY  2005 ; 2010;2014   polyp 2010 , Dr Earlean Shawl   HERNIA REPAIR  2007   I & D EXTREMITY     thumb, for infection   MITRAL VALVE REPAIR  2007   Robotic surgery @ [address] [place] , [place] and ASD repair and PFO repair   [place]   TURP , Dr Serita Butcher    No Known Allergies  Current Outpatient Medications  Medication Sig Dispense Refill   amoxicillin (AMOXIL) 500 MG capsule SMARTSIG:4 Capsule(s) By Mouth Once     aspirin 81  MG tablet Take 81 mg by mouth daily.     Cholecalciferol (VITAMIN D3) 2000 UNITS TABS Take by mouth daily.     fish oil-omega-3 fatty acids 1000 MG capsule Take by mouth daily.     FLUAD QUADRIVALENT 0.5 ML injection      FLUARIX QUADRIVALENT 0.5 ML injection      metoprolol succinate (TOPROL-XL) 25 MG 24 hr tablet Take 1/2 tablet (12.45m total) by mouth twice a day 90 tablet 2   PFIZER COVID-19 VAC BIVALENT injection      ramipril (ALTACE) 5 MG capsule Take 1 capsule by mouth daily 90 capsule 2   rosuvastatin (CRESTOR) 20 MG tablet Take 1 tablet by mouth at bedtime 90 tablet 2   No current facility-administered medications for this visit.    Social History   Socioeconomic History   Marital status: Married    Spouse name: Not on file   Number of children: Not on file   Years of education: Not on file   Highest education level: Not on file  Occupational History   Not on file  Tobacco Use   Smoking status: Former    Types: Cigarettes    Quit date: 06/10/1977    Years since quitting: 43.9   Smokeless tobacco: Never   Tobacco comments:    from age [age] up to 1 ppd or less  Substance and Sexual Activity   Alcohol use: Yes    Alcohol/week: 14.0 standard drinks    Types: 14 Glasses of wine per week   Drug use: No   Sexual activity: Not on file  Other Topics Concern   Not on file   Social History Narrative   Gets reg exercise   Social Determinants of Health   Financial Resource Strain: Not on file  Food Insecurity: Not on file  Transportation Needs: Not on file  Physical Activity: Not on file  Stress: Not on file  Social Connections: Not on file  Intimate Partner Violence: Not on file    Family History  Problem Relation Age of Onset   Hypertension Father    Cancer Father        throat ; smoker   Prostate cancer Father    Stroke Paternal Grandmother        late 771s  AAA (abdominal aortic aneurysm) Maternal Grandmother    Cancer Mother 84      ? intra-abdominal   Heart disease Neg Hx    Diabetes Neg Hx    Social history is notable that he is married he has 2 children. He does exercise. Is no tobacco or alcohol use.  His daughter is an [place] in [place] and a large firm doing exceptionally well.  His son is working in [place]  He is expecting his first grandchild April 2019   ROS General: Negative; No fevers, chills, or night sweats;  HEENT: Negative; No changes in vision or hearing, sinus congestion, difficulty swallowing Pulmonary: Negative; No cough, wheezing, shortness of breath, hemoptysis Cardiovascular: Negative; No chest pain, presyncope, syncope, palpitations GI: Negative; No nausea, vomiting, diarrhea, or abdominal pain GU: Negative; No dysuria, hematuria, or difficulty voiding Musculoskeletal: Negative; no myalgias, joint pain, or weakness Hematologic/Oncology: Negative; no easy bruising, bleeding Endocrine: Negative; no heat/cold intolerance; no diabetes Neuro: Negative; no changes in balance, headaches Skin: Negative; No rashes or skin lesions Psychiatric: Negative; No behavioral problems, depression Sleep: Negative; No snoring, daytime sleepiness, hypersomnolence, bruxism, restless legs, hypnogognic hallucinations, no cataplexy Other comprehensive  14 point system review is negative.  PE BP 126/78    Pulse 65   Ht 6' (1.829 m)   Wt 205 lb 11.2 oz (93.3 kg)   SpO2 98%   BMI 27.90 kg/m    Repeat blood pressure by me was 124/70  Wt Readings from Last 3 Encounters:  05/08/21 205 lb 11.2 oz (93.3 kg)  09/28/20 209 lb (94.8 kg)  06/01/20 217 lb (98.4 kg)   General: Alert, oriented, no distress.  Skin: normal turgor, no rashes, warm and dry HEENT: Normocephalic, atraumatic. Pupils equal round and reactive to light; sclera anicteric; extraocular muscles intact;  Nose without nasal septal hypertrophy Mouth/Parynx benign; Mallinpatti scale 2 Neck: No JVD, no carotid bruits; normal carotid upstroke Lungs: clear to ausculatation and percussion; no wheezing or rales Chest wall: without tenderness to palpitation Heart: PMI not displaced, RRR, s1 s2 normal, I did not hear any residual murmur of the mitral regurgitation, no diastolic murmur, no rubs, gallops, thrills, or heaves Abdomen: soft, nontender; no hepatosplenomehaly, BS+; abdominal aorta nontender and not dilated by palpation. Back: no CVA tenderness Pulses 2+ Musculoskeletal: full range of motion, normal strength, no joint deformities Extremities: no clubbing cyanosis or edema, Homan's sign negative  Neurologic: grossly nonfocal; Cranial nerves grossly wnl Psychologic: Normal mood and affect   May 08, 2021 ECG (independently read by me):  Sinus rhtym with PAC and sinus arrythmia  June 17, 2019 ECG (independently read by me): Normal sinus rhythm at 62 bpm.  Left axis deviation.  No ectopy.  Normal intervals.  December 2019 ECG (independently read by me): Sinus bradycardia 52 bpm.  No ectopy.  Normal intervals.  July 21, 2017 ECG (independently read by me): Sinus rhythm at 66 bpm with PAC.  Borderline first-degree block with a PR interval of 22 ms  December 2017 ECG (independently read by me): sinus bradycardia 59 bpm with a PAC.  Normal intervals.  NST segment changes.  November 2016 ECG (independently read by me):  Normal sinus rhythm with an isolated PAC.  Normal intervals.  No ST segment changes.  November 2015 ECG (independently read by me): NSR at 57; PR interval 220 ms.  QTc interval normal  Prior November 2014 ECG: Sinus at 71 beats per minute. No significant change the  LABS:  BMP Latest Ref Rng & Units 05/03/2020 05/27/2019 05/25/2018  Glucose 65 - 99 mg/dL 96 107(H) 90  BUN 8 - 27 mg/dL _0 Creatinine 0.76 - 1.27 mg/dL 1.01 1.04 0.99  BUN/Creat Ratio 10 - 24 16 - -  Sodium 134 - 144 mmol/L 138 139 140  Potassium 3.5 - 5.2 mmol/L 5.2 4.9 4.4  Chloride 96 - 106 mmol/L 102 103 104  CO2 20 - 29 mmol/L _1 Calcium 8.6 - 10.2 mg/dL 9.4 9.6 9.5   Hepatic Function Latest Ref Rng & Units 05/03/2020 05/27/2019 05/25/2018  Total Protein 6.0 - 8.5 g/dL 6.5 7.1 7.1  Albumin 3.7 - 4.7 g/dL 4.3 4.3 4.5  AST 0 - 40 IU/L _2 ALT 0 - 44 IU/L _3 Alk Phosphatase 44 - 121 IU/L 68 52 42  Total Bilirubin 0.0 - 1.2 mg/dL 0.3 0.6 0.5  Bilirubin, Direct 0.0 - 0.3 mg/dL - - -   CBC Latest Ref Rng & Units 05/03/2020 05/27/2019 05/25/2018  WBC 3.4 - 10.8 x10E3/uL 4.8 7.2 5.1  Hemoglobin 13.0 - 17.7 g/dL 14.4 14.7 14.7  Hematocrit 37.5 - 51.0 % 44.8 45.0  44.9  Platelets 150 - 450 x10E3/uL 245 239.0 233.0   Lab Results  Component Value Date   MCV 89 05/03/2020   MCV 88.9 05/27/2019   MCV 88.7 05/25/2018   Lab Results  Component Value Date   TSH 3.78 12/07/2020   Lab Results  Component Value Date   HGBA1C 6.4 12/07/2020     Lab Results  Component Value Date   PSA 0.1 05/15/2016   PSA <0.01 05/03/2015   PSA <0.01 04/25/2014   Lipid Panel     Component Value Date/Time   CHOL 161 05/03/2020 0900   TRIG 154 (H) 05/03/2020 0900   HDL 52 05/03/2020 0900   CHOLHDL 3.1 05/03/2020 0900   CHOLHDL 3 05/27/2019 0851   VLDL 21.4 05/27/2019 0851   LDLCALC 82 05/03/2020 0900   RADIOLOGY: No results found.  IMPRESSION:  1. S/P MVR (mitral valve repair)   2. H/O small  atrial septal defect (ASD) and PFO repair   3. Essential hypertension   4. Mild dilation of ascending aorta (HCC)   5. Mixed hyperlipidemia   6. Prediabetes   7. History of prostate cancer     ASSESSMENT AND PLAN: Mr. Boschert is a 70 year-old Caucasian male who is status post da Vinci robotic mitral valve repair surgery performed on 04/10/2006 for severe MR due to a ruptured chordae at which time he had placement of 4 new Gore-Tex chordae, 2 from the medial, 2 from the lateral muscle which were brought to the edges of the posterior leaflet. He also had repair of a small atrial septal defect as well as a patent foramen ovale. Preoperatively he had normal coronary arteries. He does not have any anginal symptomatology and has remained asymptomatic postoperatively.  His last echo Doppler study from November 2021 was reviewed today which continue to show normal systolic function with EF at 60 to 65%.  There was mild LVH.  There was stable mitral valve repair without stenosis or regurgitation.  There was no intra-atrial shunting.  He had mild dilatation of his ascending aorta at 42 mm and aortic root at 41 mm.  His blood pressure today is stable on his current regimen of low-dose metoprolol succinate in addition to amlodipine 5 mg.  He continues to be on 20 mg daily.  His recent hemoglobin A1c was 6.4 and is followed by Dr. Billey Gosling.  He has had purposeful weight loss with a peak weight of 228 and weight today at 205.  He had sustained a hamstring strain earlier this year which initially limited his exercise.  He he is now back at exercising regularly. He will continue his current medical regimen.  He has remote history of prostate CA and this has been stable.  I have recommended a follow-up echo Doppler study next year and I will see him in follow-up and further recommendations.  Troy Sine, MD, [facility]  05/15/2021 1:03 PM

## 2021-05-15 ENCOUNTER — Encounter: Payer: Self-pay | Admitting: Cardiovascular Disease

## 2021-05-30 ENCOUNTER — Ambulatory Visit: Payer: PPO | Admitting: Internal Medicine

## 2021-05-30 ENCOUNTER — Ambulatory Visit: Payer: PPO

## 2021-06-11 ENCOUNTER — Encounter: Payer: Self-pay | Admitting: Internal Medicine

## 2021-06-11 DIAGNOSIS — E559 Vitamin D deficiency, unspecified: Secondary | ICD-10-CM

## 2021-06-11 DIAGNOSIS — Z0000 Encounter for general adult medical examination without abnormal findings: Secondary | ICD-10-CM

## 2021-06-11 DIAGNOSIS — I10 Essential (primary) hypertension: Secondary | ICD-10-CM

## 2021-06-11 DIAGNOSIS — E782 Mixed hyperlipidemia: Secondary | ICD-10-CM

## 2021-06-11 DIAGNOSIS — E038 Other specified hypothyroidism: Secondary | ICD-10-CM

## 2021-06-11 DIAGNOSIS — R7303 Prediabetes: Secondary | ICD-10-CM

## 2021-06-13 ENCOUNTER — Other Ambulatory Visit (INDEPENDENT_AMBULATORY_CARE_PROVIDER_SITE_OTHER): Payer: PPO

## 2021-06-13 DIAGNOSIS — E038 Other specified hypothyroidism: Secondary | ICD-10-CM | POA: Diagnosis not present

## 2021-06-13 DIAGNOSIS — I10 Essential (primary) hypertension: Secondary | ICD-10-CM

## 2021-06-13 DIAGNOSIS — E559 Vitamin D deficiency, unspecified: Secondary | ICD-10-CM | POA: Diagnosis not present

## 2021-06-13 DIAGNOSIS — E782 Mixed hyperlipidemia: Secondary | ICD-10-CM

## 2021-06-13 DIAGNOSIS — Z0000 Encounter for general adult medical examination without abnormal findings: Secondary | ICD-10-CM

## 2021-06-13 DIAGNOSIS — R7303 Prediabetes: Secondary | ICD-10-CM | POA: Diagnosis not present

## 2021-06-13 LAB — CBC WITH DIFFERENTIAL/PLATELET
Basophils Absolute: 0.1 10*3/uL (ref 0.0–0.1)
Basophils Relative: 0.9 % (ref 0.0–3.0)
Eosinophils Absolute: 0.2 10*3/uL (ref 0.0–0.7)
Eosinophils Relative: 3.7 % (ref 0.0–5.0)
HCT: 43.6 % (ref 39.0–52.0)
Hemoglobin: 14 g/dL (ref 13.0–17.0)
Lymphocytes Relative: 23.5 % (ref 12.0–46.0)
Lymphs Abs: 1.3 10*3/uL (ref 0.7–4.0)
MCHC: 32.1 g/dL (ref 30.0–36.0)
MCV: 88.2 fl (ref 78.0–100.0)
Monocytes Absolute: 0.5 10*3/uL (ref 0.1–1.0)
Monocytes Relative: 9.3 % (ref 3.0–12.0)
Neutro Abs: 3.4 10*3/uL (ref 1.4–7.7)
Neutrophils Relative %: 62.6 % (ref 43.0–77.0)
Platelets: 217 10*3/uL (ref 150.0–400.0)
RBC: 4.95 Mil/uL (ref 4.22–5.81)
RDW: 14.1 % (ref 11.5–15.5)
WBC: 5.5 10*3/uL (ref 4.0–10.5)

## 2021-06-13 LAB — COMPREHENSIVE METABOLIC PANEL
ALT: 12 U/L (ref 0–53)
AST: 15 U/L (ref 0–37)
Albumin: 4.1 g/dL (ref 3.5–5.2)
Alkaline Phosphatase: 47 U/L (ref 39–117)
BUN: 18 mg/dL (ref 6–23)
CO2: 24 mEq/L (ref 19–32)
Calcium: 9.1 mg/dL (ref 8.4–10.5)
Chloride: 104 mEq/L (ref 96–112)
Creatinine, Ser: 1 mg/dL (ref 0.40–1.50)
GFR: 75.36 mL/min (ref >60.00)
Glucose, Bld: 97 mg/dL (ref 70–99)
Potassium: 4.2 mEq/L (ref 3.5–5.1)
Sodium: 138 mEq/L (ref 135–145)
Total Bilirubin: 0.6 mg/dL (ref 0.2–1.2)
Total Protein: 6.6 g/dL (ref 6.0–8.3)

## 2021-06-13 LAB — LDL CHOLESTEROL, DIRECT: Direct LDL: 90 mg/dL

## 2021-06-13 LAB — LIPID PANEL
Cholesterol: 164 mg/dL (ref 0–200)
HDL: 44.7 mg/dL (ref >39.00)
NonHDL: 119.09
Total CHOL/HDL Ratio: 4
Triglycerides: 201 mg/dL — ABNORMAL HIGH (ref 0.0–149.0)
VLDL: 40.2 mg/dL — ABNORMAL HIGH (ref 0.0–40.0)

## 2021-06-13 LAB — VITAMIN D 25 HYDROXY (VIT D DEFICIENCY, FRACTURES): VITD: 33.25 ng/mL (ref 30.00–100.00)

## 2021-06-13 LAB — HEMOGLOBIN A1C: Hgb A1c MFr Bld: 6.4 % (ref 4.6–6.5)

## 2021-06-13 LAB — TSH: TSH: 4.85 u[IU]/mL (ref 0.35–5.50)

## 2021-06-14 ENCOUNTER — Encounter: Payer: Self-pay | Admitting: Internal Medicine

## 2021-06-14 NOTE — Progress Notes (Signed)
Subjective:    Patient ID: William Lewis, male    DOB: 12/01/1950, 71 y.o.   MRN: [id number]   This visit occurred during the SARS-CoV-2 public health emergency.  Safety protocols were in place, including screening questions prior to the visit, additional usage of staff PPE, and extensive cleaning of exam room while observing appropriate contact time as indicated for disinfecting solutions.   HPI He is here for a physical exam.   Overall he feels well and has no major concerns.  Medications and allergies reviewed with patient and updated if appropriate.  Patient Active Problem List   Diagnosis Date Noted   Strain of hamstring muscle, subsequent encounter 06/01/2020   Prediabetes 05/29/2020   Subclinical hypothyroidism 05/29/2020   Schatzki's ring 10/17/2017   S/P MVR (mitral valve repair),2007 along with ASD repair 04/26/2013   Hx of colonic polyps 04/14/2013   Vitamin D deficiency 03/30/2012   Essential hypertension 06/29/2010   Hyperlipidemia 10/21/2007   MITRAL REGURGITATION 10/21/2007   HX, PERSONAL, MALIGNANCY, PROSTATE 10/23/2006    Current Outpatient Medications on File Prior to Visit  Medication Sig Dispense Refill   amoxicillin (AMOXIL) 500 MG capsule SMARTSIG:4 Capsule(s) By Mouth Once     aspirin 81 MG tablet Take 81 mg by mouth daily.     Cholecalciferol (VITAMIN D3) 2000 UNITS TABS Take by mouth daily.     fish oil-omega-3 fatty acids 1000 MG capsule Take by mouth daily.     metoprolol succinate (TOPROL-XL) 25 MG 24 hr tablet Take 1/2 tablet (12.5mg  total) by mouth twice a day 90 tablet 2   ramipril (ALTACE) 5 MG capsule Take 1 capsule by mouth daily 90 capsule 2   rosuvastatin (CRESTOR) 20 MG tablet Take 1 tablet by mouth at bedtime 90 tablet 2   No current facility-administered medications on file prior to visit.    Past Medical History:  Diagnosis Date   ASD (atrial septal defect) 2007   repaired with PFO in [facility]   Echocardiogram abnormal 2013    EF 62-70%, normal diastolic parameters, mild mitral regur. with his post leaflet of MV calcified   History of stress test 2008   neg.for ischemia   HTN (hypertension)    MVP (mitral valve prolapse) 2007   mitral valve repair in [place]   Other and unspecified hyperlipidemia    Dr Ellouise Newer    Past Surgical History:  Procedure Laterality Date   CARDIAC CATHETERIZATION  2007   EF 65-70%, significant mitral prolapse wth 4+ MR, normal coronary arteries    COLONOSCOPY  2005 ; 2010;2014   polyp 2010 , Dr Earlean Shawl   HERNIA REPAIR  2007   I & D EXTREMITY     thumb, for infection   MITRAL VALVE REPAIR  2007   Robotic surgery @ [facility] , [place] and ASD repair and PFO repair   [place]   TURP , Dr Serita Butcher    Social History   Socioeconomic History   Marital status: Married    Spouse name: Not on file   Number of children: Not on file   Years of education: Not on file   Highest education level: Not on file  Occupational History   Not on file  Tobacco Use   Smoking status: Former    Types: Cigarettes    Quit date: 06/10/1977    Years since quitting: 44.0   Smokeless tobacco: Never   Tobacco comments:    from age [age], up  to 1 ppd or less  Substance and Sexual Activity   Alcohol use: Yes    Alcohol/week: 14.0 standard drinks    Types: 14 Glasses of wine per week   Drug use: No   Sexual activity: Not on file  Other Topics Concern   Not on file  Social History Narrative   Gets reg exercise   Social Determinants of Health   Financial Resource Strain: Low Risk    Difficulty of Paying Living Expenses: Not hard at all  Food Insecurity: No Food Insecurity   Worried About Charity fundraiser in the Last Year: Never true   [place] in the Last Year: Never true  Transportation Needs: No Transportation Needs   Lack of Transportation (Medical): No   Lack of Transportation (Non-Medical): No  Physical Activity: Sufficiently Active   Days of Exercise per  Week: 5 days   Minutes of Exercise per Session: 30 min  Stress: No Stress Concern Present   Feeling of Stress : Not at all  Social Connections: Socially Integrated   Frequency of Communication with Friends and Family: More than three times a week   Frequency of Social Gatherings with Friends and Family: Twice a week   Attends Religious Services: More than 4 times per year   Active Member of [employer] or Organizations: Yes   Attends Music therapist: More than 4 times per year   Marital Status: Married    Family History  Problem Relation Age of Onset   Hypertension Father    Cancer Father        throat ; smoker   Prostate cancer Father    Stroke Paternal Grandmother        late 96s   AAA (abdominal aortic aneurysm) Maternal Grandmother    Cancer Mother 44       ? intra-abdominal   Heart disease Neg Hx    Diabetes Neg Hx     Review of Systems  Constitutional:  Negative for fever.  Eyes:  Negative for visual disturbance.  Respiratory:  Negative for cough, shortness of breath and wheezing.   Cardiovascular:  Negative for chest pain, palpitations and leg swelling.  Gastrointestinal:  Negative for abdominal pain, blood in stool, constipation, diarrhea and nausea.       No gerd  Genitourinary:  Negative for difficulty urinating and dysuria.  Musculoskeletal:  Positive for arthralgias (minimal stiffness). Negative for back pain.  Skin:  Negative for rash.  Neurological:  Negative for light-headedness and headaches.  Psychiatric/Behavioral:  Negative for dysphoric mood. The patient is not nervous/anxious.       Objective:   Vitals:   06/15/21 1322  BP: 118/70  Pulse: 64  Temp: 97.8 F (36.6 C)  SpO2: 99%   Filed Weights   06/15/21 1322  Weight: 216 lb (98 kg)   Body mass index is 29.29 kg/m.  BP Readings from Last 3 Encounters:  06/15/21 118/70  06/15/21 118/70  05/08/21 126/78    Wt Readings from Last 3 Encounters:  06/15/21 216 lb (98 kg)  06/15/21  207 lb (93.9 kg)  05/08/21 205 lb 11.2 oz (93.3 kg)     Physical Exam Constitutional: He appears well-developed and well-nourished. No distress.  HENT:  Head: Normocephalic and atraumatic.  Right Ear: External ear normal.  Left Ear: External ear normal.  Mouth/Throat: Oropharynx is clear and moist.  Normal ear canals and TM b/l  Eyes: Conjunctivae and EOM are normal.  Neck: Neck supple.  No tracheal deviation present. No thyromegaly present.  No carotid bruit  Cardiovascular: Normal rate, regular rhythm, normal heart sounds and intact distal pulses.   No murmur heard. Pulmonary/Chest: Effort normal and breath sounds normal. No respiratory distress. He has no wheezes. He has no rales.  Abdominal: Soft. He exhibits no distension. There is no tenderness.  Genitourinary: deferred  Musculoskeletal: He exhibits no edema.  Lymphadenopathy:   He has no cervical adenopathy.  Skin: Skin is warm and dry. He is not diaphoretic.  Psychiatric: He has a normal mood and affect. His behavior is normal.    Lab Results  Component Value Date   WBC 5.5 06/13/2021   HGB 14.0 06/13/2021   HCT 43.6 06/13/2021   PLT 217.0 06/13/2021   GLUCOSE 97 06/13/2021   CHOL 164 06/13/2021   TRIG 201.0 (H) 06/13/2021   HDL 44.70 06/13/2021   LDLDIRECT 90.0 06/13/2021   LDLCALC 82 05/03/2020   ALT 12 06/13/2021   AST 15 06/13/2021   NA 138 06/13/2021   K 4.2 06/13/2021   CL 104 06/13/2021   CREATININE 1.00 06/13/2021   BUN 18 06/13/2021   CO2 24 06/13/2021   TSH 4.85 06/13/2021   PSA 0.1 05/15/2016   HGBA1C 6.4 06/13/2021        Assessment & Plan:   Physical exam: Screening blood work  ordered Exercise   regular Weight working on [employer] loss-has lost some weight since he was here last year Substance abuse   none   Reviewed recommended immunizations.   Health Maintenance  Topic Date Due   COLONOSCOPY (Pts 45-74yrs Insurance coverage will need to be confirmed)  11/18/2022   TETANUS/TDAP   10/27/2028   Pneumonia Vaccine 81+ Years old  Completed   INFLUENZA VACCINE  Completed   COVID-19 Vaccine  Completed   Hepatitis C Screening  Completed   Zoster Vaccines- Shingrix  Completed   HPV VACCINES  Aged Out     See Problem List for Assessment and Plan of chronic medical problems.

## 2021-06-14 NOTE — Patient Instructions (Addendum)
Medications changes include :  none     Please followup in 1 year   Health Maintenance, Male Adopting a healthy lifestyle and getting preventive care are important in promoting health and wellness. Ask your health care provider about: The right schedule for you to have regular tests and exams. Things you can do on your own to prevent diseases and keep yourself healthy. What should I know about diet, weight, and exercise? Eat a healthy diet  Eat a diet that includes plenty of vegetables, fruits, low-fat dairy products, and lean protein. Do not eat a lot of foods that are high in solid fats, added sugars, or sodium. Maintain a healthy weight Body mass index (BMI) is a measurement that can be used to identify possible weight problems. It estimates body fat based on height and weight. Your health care provider can help determine your BMI and help you achieve or maintain a healthy weight. Get regular exercise Get regular exercise. This is one of the most important things you can do for your health. Most adults should: Exercise for at least 150 minutes each week. The exercise should increase your heart rate and make you sweat (moderate-intensity exercise). Do strengthening exercises at least twice a week. This is in addition to the moderate-intensity exercise. Spend less time sitting. Even light physical activity can be beneficial. Watch cholesterol and blood lipids Have your blood tested for lipids and cholesterol at 73 years of age, then have this test every 5 years. You may need to have your cholesterol levels checked more often if: Your lipid or cholesterol levels are high. You are older than 73 years of age. You are at high risk for heart disease. What should I know about cancer screening? Many types of cancers can be detected early and may often be prevented. Depending on your health history and family history, you may need to have cancer screening at various ages. This may  include screening for: Colorectal cancer. Prostate cancer. Skin cancer. Lung cancer. What should I know about heart disease, diabetes, and high blood pressure? Blood pressure and heart disease High blood pressure causes heart disease and increases the risk of stroke. This is more likely to develop in people who have high blood pressure readings or are overweight. Talk with your health care provider about your target blood pressure readings. Have your blood pressure checked: Every 3-5 years if you are 47-63 years of age. Every year if you are 22 years old or older. If you are between the ages of 89 and 59 and are a current or former smoker, ask your health care provider if you should have a one-time screening for abdominal aortic aneurysm (AAA). Diabetes Have regular diabetes screenings. This checks your fasting blood sugar level. Have the screening done: Once every three years after age 25 if you are at a normal weight and have a low risk for diabetes. More often and at a younger age if you are overweight or have a high risk for diabetes. What should I know about preventing infection? Hepatitis B If you have a higher risk for hepatitis B, you should be screened for this virus. Talk with your health care provider to find out if you are at risk for hepatitis B infection. Hepatitis C Blood testing is recommended for: Everyone born from [birth year] through 1967. Anyone with known risk factors for hepatitis C. Sexually transmitted infections (STIs) You should be screened each year for STIs, including gonorrhea and chlamydia, if: You are sexually  active and are younger than 73 years of age. You are older than 73 years of age and your health care provider tells you that you are at risk for this type of infection. Your sexual activity has changed since you were last screened, and you are at increased risk for chlamydia or gonorrhea. Ask your health care provider if you are at risk. Ask your health care  provider about whether you are at high risk for HIV. Your health care provider may recommend a prescription medicine to help prevent HIV infection. If you choose to take medicine to prevent HIV, you should first get tested for HIV. You should then be tested every 3 months for as long as you are taking the medicine. Follow these instructions at home: Alcohol use Do not drink alcohol if your health care provider tells you not to drink. If you drink alcohol: Limit how much you have to 0-2 drinks a day. Know how much alcohol is in your drink. In the [place] one drink equals one 12 oz bottle of beer (355 mL), one 5 oz glass of wine (148 mL), or one 1 oz glass of hard liquor (44 mL). Lifestyle Do not use any products that contain nicotine or tobacco. These products include cigarettes, chewing tobacco, and vaping devices, such as e-cigarettes. If you need help quitting, ask your health care provider. Do not use street drugs. Do not share needles. Ask your health care provider for help if you need support or information about quitting drugs. General instructions Schedule regular health, dental, and eye exams. Stay current with your vaccines. Tell your health care provider if: You often feel depressed. You have ever been abused or do not feel safe at home. Summary Adopting a healthy lifestyle and getting preventive care are important in promoting health and wellness. Follow your health care provider's instructions about healthy diet, exercising, and getting tested or screened for diseases. Follow your health care provider's instructions on monitoring your cholesterol and blood pressure. This information is not intended to replace advice given to you by your health care provider. Make sure you discuss any questions you have with your health care provider. Document Revised: 10/16/2020 Document Reviewed: 10/16/2020 Elsevier Patient Education  Keys.

## 2021-06-15 ENCOUNTER — Ambulatory Visit (INDEPENDENT_AMBULATORY_CARE_PROVIDER_SITE_OTHER): Payer: PPO | Admitting: Internal Medicine

## 2021-06-15 ENCOUNTER — Other Ambulatory Visit: Payer: Self-pay

## 2021-06-15 ENCOUNTER — Ambulatory Visit (INDEPENDENT_AMBULATORY_CARE_PROVIDER_SITE_OTHER): Payer: PPO

## 2021-06-15 VITALS — BP 118/70 | HR 64 | Temp 97.8°F | Resp 16 | Ht 72.0 in | Wt 207.0 lb

## 2021-06-15 VITALS — BP 118/70 | HR 64 | Temp 97.8°F | Ht 72.0 in | Wt 216.0 lb

## 2021-06-15 DIAGNOSIS — E038 Other specified hypothyroidism: Secondary | ICD-10-CM

## 2021-06-15 DIAGNOSIS — Z0000 Encounter for general adult medical examination without abnormal findings: Secondary | ICD-10-CM

## 2021-06-15 DIAGNOSIS — R7303 Prediabetes: Secondary | ICD-10-CM

## 2021-06-15 DIAGNOSIS — E782 Mixed hyperlipidemia: Secondary | ICD-10-CM

## 2021-06-15 DIAGNOSIS — I10 Essential (primary) hypertension: Secondary | ICD-10-CM

## 2021-06-15 DIAGNOSIS — E559 Vitamin D deficiency, unspecified: Secondary | ICD-10-CM

## 2021-06-15 NOTE — Assessment & Plan Note (Addendum)
Chronic Vitamin d level is good Continue vitamin D daily

## 2021-06-15 NOTE — Assessment & Plan Note (Addendum)
Chronic BP well controlled Continue metoprolol xl 12.5 mg bid, Altace 5 mg daily CMP reviewed

## 2021-06-15 NOTE — Assessment & Plan Note (Signed)
Chronic Lab Results  Component Value Date   HGBA1C 6.4 06/13/2021   Continue regular exercise Has healthy diet

## 2021-06-15 NOTE — Assessment & Plan Note (Signed)
H/o tsh being elevated Clinically euthyroid tsh in normal range

## 2021-06-15 NOTE — Patient Instructions (Signed)
[name] , Thank you for taking time to come for your Medicare Wellness Visit. I appreciate your ongoing commitment to your health goals. Please review the following plan we discussed and let me know if I can assist you in the future.   Screening recommendations/referrals: Colonoscopy: 11/17/2017; due every 5 years (due 11/18/2022) Recommended yearly ophthalmology/optometry visit for glaucoma screening and checkup Recommended yearly dental visit for hygiene and checkup  Vaccinations: Influenza vaccine: 02/19/2021 Pneumococcal vaccine: 05/16/2014, 05/19/2015 Tdap vaccine: 10/28/2018; due every 10 years (due 10/27/2028) Shingles vaccine: 08/14/2017, 10/15/2017   Covid-19: 06/30/2019,07/21/2019, 03/13/2020, 09/22/2020, 03/21/2021  Advanced directives: Please bring a copy of your health care power of attorney and living will to the office at your convenience.  Conditions/risks identified: Yes; Client understands the importance of follow-up with providers by attending scheduled visits and discussed goals to eat healthier, increase physical activity, exercise the brain, socialize more, get enough sleep and make time for laughter.  Next appointment: Please schedule your next Medicare Wellness Visit with your Nurse Health Advisor in 1 year by calling [phone].  Preventive Care 18 Years and Older, Male Preventive care refers to lifestyle choices and visits with your health care provider that can promote health and wellness. What does preventive care include? A yearly physical exam. This is also called an annual well check. Dental exams once or twice a year. Routine eye exams. Ask your health care provider how often you should have your eyes checked. Personal lifestyle choices, including: Daily care of your teeth and gums. Regular physical activity. Eating a healthy diet. Avoiding tobacco and drug use. Limiting alcohol use. Practicing safe sex. Taking low doses of aspirin every day. Taking vitamin  and mineral supplements as recommended by your health care provider. What happens during an annual well check? The services and screenings done by your health care provider during your annual well check will depend on your age, overall health, lifestyle risk factors, and family history of disease. Counseling  Your health care provider may ask you questions about your: Alcohol use. Tobacco use. Drug use. Emotional well-being. Home and relationship well-being. Sexual activity. Eating habits. History of falls. Memory and ability to understand (cognition). Work and work Statistician. Screening  You may have the following tests or measurements: Height, weight, and BMI. Blood pressure. Lipid and cholesterol levels. These may be checked every 5 years, or more frequently if you are over 79 years old. Skin check. Lung cancer screening. You may have this screening every year starting at age 55 if you have a 30-pack-year history of smoking and currently smoke or have quit within the past 15 years. Fecal occult blood test (FOBT) of the stool. You may have this test every year starting at age 9. Flexible sigmoidoscopy or colonoscopy. You may have a sigmoidoscopy every 5 years or a colonoscopy every 10 years starting at age 42. Prostate cancer screening. Recommendations will vary depending on your family history and other risks. Hepatitis C blood test. Hepatitis B blood test. Sexually transmitted disease (STD) testing. Diabetes screening. This is done by checking your blood sugar (glucose) after you have not eaten for a while (fasting). You may have this done every 1-3 years. Abdominal aortic aneurysm (AAA) screening. You may need this if you are a current or former smoker. Osteoporosis. You may be screened starting at age 30 if you are at high risk. Talk with your health care provider about your test results, treatment options, and if necessary, the need for more tests. Vaccines  Your  health care  provider may recommend certain vaccines, such as: Influenza vaccine. This is recommended every year. Tetanus, diphtheria, and acellular pertussis (Tdap, Td) vaccine. You may need a Td booster every 10 years. Zoster vaccine. You may need this after age 25. Pneumococcal 13-valent conjugate (PCV13) vaccine. One dose is recommended after age 52. Pneumococcal polysaccharide (PPSV23) vaccine. One dose is recommended after age 33. Talk to your health care provider about which screenings and vaccines you need and how often you need them. This information is not intended to replace advice given to you by your health care provider. Make sure you discuss any questions you have with your health care provider. Document Released: 06/23/2015 Document Revised: 02/14/2016 Document Reviewed: 03/28/2015 Elsevier Interactive Patient Education  2017 [place] Prevention in the Home Falls can cause injuries. They can happen to people of all ages. There are many things you can do to make your home safe and to help prevent falls. What can I do on the outside of my home? Regularly fix the edges of walkways and driveways and fix any cracks. Remove anything that might make you trip as you walk through a door, such as a raised step or threshold. Trim any bushes or trees on the path to your home. Use bright outdoor lighting. Clear any walking paths of anything that might make someone trip, such as rocks or tools. Regularly check to see if handrails are loose or broken. Make sure that both sides of any steps have handrails. Any raised decks and porches should have guardrails on the edges. Have any leaves, snow, or ice cleared regularly. Use sand or salt on walking paths during winter. Clean up any spills in your garage right away. This includes oil or grease spills. What can I do in the bathroom? Use night lights. Install grab bars by the toilet and in the tub and shower. Do not use towel bars as grab  bars. Use non-skid mats or decals in the tub or shower. If you need to sit down in the shower, use a plastic, non-slip stool. Keep the floor dry. Clean up any water that spills on the floor as soon as it happens. Remove soap buildup in the tub or shower regularly. Attach bath mats securely with double-sided non-slip rug tape. Do not have throw rugs and other things on the floor that can make you trip. What can I do in the bedroom? Use night lights. Make sure that you have a light by your bed that is easy to reach. Do not use any sheets or blankets that are too big for your bed. They should not hang down onto the floor. Have a firm chair that has side arms. You can use this for support while you get dressed. Do not have throw rugs and other things on the floor that can make you trip. What can I do in the kitchen? Clean up any spills right away. Avoid walking on wet floors. Keep items that you use a lot in easy-to-reach places. If you need to reach something above you, use a strong step stool that has a grab bar. Keep electrical cords out of the way. Do not use floor polish or wax that makes floors slippery. If you must use wax, use non-skid floor wax. Do not have throw rugs and other things on the floor that can make you trip. What can I do with my stairs? Do not leave any items on the stairs. Make sure that there are handrails  on both sides of the stairs and use them. Fix handrails that are broken or loose. Make sure that handrails are as long as the stairways. Check any carpeting to make sure that it is firmly attached to the stairs. Fix any carpet that is loose or worn. Avoid having throw rugs at the top or bottom of the stairs. If you do have throw rugs, attach them to the floor with carpet tape. Make sure that you have a light switch at the top of the stairs and the bottom of the stairs. If you do not have them, ask someone to add them for you. What else can I do to help prevent  falls? Wear shoes that: Do not have high heels. Have rubber bottoms. Are comfortable and fit you well. Are closed at the toe. Do not wear sandals. If you use a stepladder: Make sure that it is fully opened. Do not climb a closed stepladder. Make sure that both sides of the stepladder are locked into place. Ask someone to hold it for you, if possible. Clearly mark and make sure that you can see: Any grab bars or handrails. First and last steps. Where the edge of each step is. Use tools that help you move around (mobility aids) if they are needed. These include: Canes. Walkers. Scooters. Crutches. Turn on the lights when you go into a dark area. Replace any light bulbs as soon as they burn out. Set up your furniture so you have a clear path. Avoid moving your furniture around. If any of your floors are uneven, fix them. If there are any pets around you, be aware of where they are. Review your medicines with your doctor. Some medicines can make you feel dizzy. This can increase your chance of falling. Ask your doctor what other things that you can do to help prevent falls. This information is not intended to replace advice given to you by your health care provider. Make sure you discuss any questions you have with your health care provider. Document Released: 03/23/2009 Document Revised: 11/02/2015 Document Reviewed: 07/01/2014 Elsevier Interactive Patient Education  2017 [employer].

## 2021-06-15 NOTE — Assessment & Plan Note (Signed)
Chronic Lipids controlled overall Continue crestor 20 mg  Regular exercise and healthy diet encouraged

## 2021-06-15 NOTE — Progress Notes (Addendum)
Subjective:   William Lewis is a 71 y.o. male who presents for Medicare Annual/Subsequent preventive examination.  Review of Systems     Cardiac Risk Factors include: advanced age (>13mn, >>70women);dyslipidemia;hypertension;male gender;family history of premature cardiovascular disease     Objective:    Today's Vitals   06/15/21 1243  BP: 118/70  Pulse: 64  Resp: 16  Temp: 97.8 F (36.6 C)  TempSrc: Temporal  Weight: 207 lb (93.9 kg)  Height: 6' (1.829 m)  PainSc: 0-No pain   Body mass index is 28.07 kg/m.  Advanced Directives 06/15/2021 06/25/2016 12/08/2015  Does Patient Have a Medical Advance Directive? Yes No No  Type of Advance Directive Living will;Healthcare Power of Attorney - -  Does patient want to make changes to medical advance directive? No - Patient declined - -  Copy of HMoss Pointin Chart? No - copy requested - -    Current Medications (verified) Outpatient Encounter Medications as of 06/15/2021  Medication Sig   amoxicillin (AMOXIL) 500 MG capsule SMARTSIG:4 Capsule(s) By Mouth Once   aspirin 81 MG tablet Take 81 mg by mouth daily.   Cholecalciferol (VITAMIN D3) 2000 UNITS TABS Take by mouth daily.   fish oil-omega-3 fatty acids 1000 MG capsule Take by mouth daily.   metoprolol succinate (TOPROL-XL) 25 MG 24 hr tablet Take 1/2 tablet (12.589mtotal) by mouth twice a day   ramipril (ALTACE) 5 MG capsule Take 1 capsule by mouth daily   rosuvastatin (CRESTOR) 20 MG tablet Take 1 tablet by mouth at bedtime   No facility-administered encounter medications on file as of 06/15/2021.    Allergies (verified) Patient has no known allergies.   History: Past Medical History:  Diagnosis Date   ASD (atrial septal defect) 2007   [place]chocardi[place] abnormal 2013   EF 5094-80%normal diastolic parameters, mild mitral regur. with his post leaflet of MV calcified   History of stress test 2008   neg.for ischemia   HTN  (hypertension)    MVP (mitral valve prolapse) 2007   [place] Other and unspecified hyperlipidemia    Dr ToEllouise Newer Past Surgical History:  Procedure Laterality Date   CARDIAC CATHETERIZATION  2007   EF 65-70%, significant mitral prolapse wth 4+ MR, normal coronary arteries    COLONOSCOPY  2005 ; 2010;2014   polyp 2010 , Dr MeEarlean Shawl HERNIA REPAIR  2007   I & D EXTREMITY     thumb, for infection   MITRAL VALVE REPAIR  2007   [place] [facility] [place]   [place] , Dr KiSerita Butcher Family History  Problem Relation Age of Onset   Hypertension Father    Cancer Father        throat ; smoker   Prostate cancer Father    Stroke Paternal Grandmother        late 7055s AAA (abdominal aortic aneurysm) Maternal Grandmother    Cancer Mother [id number]     ? intra-abdominal   Heart disease Neg Hx    Diabetes Neg Hx    Social History   Socioeconomic History   Marital status: Married    Spouse name: Not on file   Number of children: Not on file   Years of education: Not on file   Highest education level:  Not on file  Occupational History   Not on file  Tobacco Use   Smoking status: Former    Types: Cigarettes    Quit date: 06/10/1977    Years since quitting: 44.0   Smokeless tobacco: Never   Tobacco comments:    from age [age], up to 1 ppd or less  Substance and Sexual Activity   Alcohol use: Yes    Alcohol/week: 14.0 standard drinks    Types: 14 Glasses of wine per week   Drug use: No   Sexual activity: Not on file  Other Topics Concern   Not on file  Social History Narrative   Gets reg exercise   Social Determinants of Health   Financial Resource Strain: Low Risk    Difficulty of Paying Living Expenses: Not hard at all  Food Insecurity: No Food Insecurity   Worried About Charity fundraiser in the Last Year: Never true   [place] in the Last Year: Never true  Transportation  Needs: No Transportation Needs   Lack of Transportation (Medical): No   Lack of Transportation (Non-Medical): No  Physical Activity: Sufficiently Active   Days of Exercise per Week: 5 days   Minutes of Exercise per Session: 30 min  Stress: No Stress Concern Present   Feeling of Stress : Not at all  Social Connections: Socially Integrated   Frequency of Communication with Friends and Family: More than three times a week   Frequency of Social Gatherings with Friends and Family: Twice a week   Attends Religious Services: More than 4 times per year   Active Member of [employer] or Organizations: Yes   Attends Music therapist: More than 4 times per year   Marital Status: Married    Tobacco Counseling Counseling given: Not Answered Tobacco comments: from age [age], up to 1 ppd or less   Clinical Intake:  Pre-visit preparation completed: Yes  Pain : No/denies pain Pain Score: 0-No pain     BMI - recorded: 28.07 Nutritional Status: BMI 25 -29 Overweight Nutritional Risks: None Diabetes: No  How often do you need to have someone help you when you read instructions, pamphlets, or other written materials from your doctor or pharmacy?: 1 - Never What is the last grade level you completed in school?: Master Degree in Gaffer)  Diabetic? no  Interpreter Needed?: No  Information entered by :: Lisette Abu, LPN   Activities of Daily Living In your present state of health, do you have any difficulty performing the following activities: 06/15/2021  Hearing? N  Vision? N  Difficulty concentrating or making decisions? N  Walking or climbing stairs? N  Dressing or bathing? N  Doing errands, shopping? N  Preparing Food and eating ? N  Using the Toilet? N  In the past six months, have you accidently leaked urine? N  Do you have problems with loss of bowel control? N  Managing your Medications? N  Managing your Finances? N  Housekeeping or  managing your Housekeeping? N  Some recent data might be hidden    Patient Care Team: Binnie Rail, MD as PCP - General (Internal Medicine) Jerline Pain Mingo Amber, DO as Consulting Physician (Ophthalmology)  Indicate any recent Medical Services you may have received from other than [facility] providers in the past year (date may be approximate).     Assessment:   This is a routine wellness examination for William Lewis.  Hearing/Vision screen Hearing Screening - Comments:: Patient  denied any hearing difficulty.   No hearing aids.  Vision Screening - Comments:: Patient wears corrective glasses/contacts.  Eye exam done annually by: [facility] (DR. Mardene Speak)    Dietary issues and exercise activities discussed: Current Exercise Habits: Home exercise routine, Type of exercise: walking;Other - see comments (tennis, bicycle riding, golf; very active), Time (Minutes): 30, Frequency (Times/Week): 5, Weekly Exercise (Minutes/Week): 150, Intensity: Moderate, Exercise limited by: None identified   Goals Addressed               This Visit's Progress     Patient Stated (pt-stated)        My goal is to continue to eat healthy, stay physically active, watch my portion control and get down to a weight goal of 195-200 pounds.      Depression Screen PHQ 2/9 Scores 06/15/2021 05/29/2020 06/25/2016 05/20/2016 04/14/2013  PHQ - 2 Score 0 0 0 0 0    Fall Risk Fall Risk  06/15/2021 05/29/2020 06/25/2016 05/20/2016 04/14/2013  Falls in the past year? 0 0 No No No  Number falls in past yr: 0 0 - - -  Injury with Fall? 0 0 - - -  Risk for fall due to : No Fall Risks No Fall Risks - - -  Follow up Falls evaluation completed Falls evaluation completed - - -    FALL RISK PREVENTION PERTAINING TO THE HOME:  Any stairs in or around the home? Yes  If so, are there any without handrails? No  Home free of loose throw rugs in walkways, pet beds, electrical cords, etc? Yes  Adequate lighting in your home to  reduce risk of falls? Yes   ASSISTIVE DEVICES UTILIZED TO PREVENT FALLS:  Life alert? No  Use of a cane, walker or w/c? No  Grab bars in the bathroom? No  Shower chair or bench in shower? No  Elevated toilet seat or a handicapped toilet? No   TIMED UP AND GO:  Was the test performed? Yes .  Length of time to ambulate 10 feet: 6 sec.   Gait steady and fast without use of assistive device  Cognitive Function: Normal cognitive status assessed by direct observation by this Nurse Health Advisor. No abnormalities found.          Immunizations Immunization History  Administered Date(s) Administered   Fluad Quad(high Dose 65+) 02/22/2019, 02/16/2020   Influenza Whole 03/02/2012, 03/01/2013   Influenza, High Dose Seasonal PF 02/18/2018   Influenza-Unspecified 02/28/2014, 02/20/2015, 03/04/2016, 03/03/2017, 02/19/2021   MMR 04/16/2019   PFIZER(Purple Top)SARS-COV-2 Vaccination 06/30/2019, 07/21/2019, 03/13/2020, 09/22/2020   Pfizer Covid-19 Vaccine Bivalent Booster 49yr & up 03/21/2021   Pneumococcal Conjugate-13 05/16/2014   Pneumococcal Polysaccharide-23 05/19/2015   Td 09/02/2008   Tdap 10/28/2018   Zoster Recombinat (Shingrix) 08/14/2017, 10/15/2017   Zoster, Live 03/27/2011    TDAP status: Up to date  Flu Vaccine status: Up to date  Pneumococcal vaccine status: Up to date  Covid-19 vaccine status: Information provided on how to obtain vaccines.   Qualifies for Shingles Vaccine? Yes   Zostavax completed Yes   Shingrix Completed?: Yes  Screening Tests Health Maintenance  Topic Date Due   COLONOSCOPY (Pts [phone] coverage will need to be confirmed)  11/18/2022   TETANUS/TDAP  10/27/2028   Pneumonia Vaccine 6579Years old  Completed   INFLUENZA VACCINE  Completed   COVID-19 Vaccine  Completed   Hepatitis C Screening  Completed   Zoster Vaccines- Shingrix  Completed  HPV VACCINES  Aged Out    Health Maintenance  There are no preventive care  reminders to display for this patient.  Colorectal cancer screening: Type of screening: Colonoscopy. Completed 11/17/2017. Repeat every 5 years  Lung Cancer Screening: (Low Dose CT Chest recommended if Age 50-80 years, 30 pack-year currently smoking OR have quit w/in 15years.) does not qualify.   Lung Cancer Screening Referral: no  Additional Screening:  Hepatitis C Screening: does qualify; Completed yes  Vision Screening: Recommended annual ophthalmology exams for early detection of glaucoma and other disorders of the eye. Is the patient up to date with their annual eye exam?  Yes  Who is the provider or what is the name of the office in which the patient attends annual eye exams? [facility] (Dr. Mardene Speak) If pt is not established with a provider, would they like to be referred to a provider to establish care? No .   Dental Screening: Recommended annual dental exams for proper oral hygiene  Community Resource Referral / Chronic Care Management: CRR required this visit?  No   CCM required this visit?  No      Plan:     I have personally reviewed and noted the following in the patients chart:   Medical and social history Use of alcohol, tobacco or illicit drugs  Current medications and supplements including opioid prescriptions. Patient is not currently taking opioid prescriptions. Functional ability and status Nutritional status Physical activity Advanced directives List of other physicians Hospitalizations, surgeries, and ER visits in previous 12 months Vitals Screenings to include cognitive, depression, and falls Referrals and appointments  In addition, I have reviewed and discussed with patient certain preventive protocols, quality metrics, and best practice recommendations. A written personalized care plan for preventive services as well as general preventive health recommendations were provided to patient.     Sheral Flow, LPN   9/0/9311    Nurse Notes:  Hearing Screening - Comments:: Patient denied any hearing difficulty.   No hearing aids.  Vision Screening - Comments:: Patient wears corrective glasses/contacts.  Eye exam done annually by: [facility] (DR. Mardene Speak)

## 2021-06-19 ENCOUNTER — Telehealth (INDEPENDENT_AMBULATORY_CARE_PROVIDER_SITE_OTHER): Payer: PPO | Admitting: Internal Medicine

## 2021-06-19 ENCOUNTER — Telehealth: Payer: Self-pay

## 2021-06-19 ENCOUNTER — Encounter: Payer: Self-pay | Admitting: Internal Medicine

## 2021-06-19 DIAGNOSIS — U071 COVID-19: Secondary | ICD-10-CM | POA: Diagnosis not present

## 2021-06-19 NOTE — Progress Notes (Signed)
Virtual Visit via Video Note  I connected with William Lewis on 06/19/21 at 11:30 AM EST by a video enabled telemedicine application and verified that I am speaking with the correct person using two identifiers.  Location patient: home Location provider: work office Persons participating in the virtual visit: patient, provider  I discussed the limitations of evaluation and management by telemedicine and the availability of in person appointments. The patient expressed understanding and agreed to proceed.   HPI: He has called [place] to inform that he tested positive for COVID today.  Onset of symptoms was 4 days ago.  He thought he had a cold.  His symptoms have mainly been runny and congested nose, sneezing, mild cough.  His wife had similar symptoms last week but never tested positive.  He has not had any concerning symptoms such as shortness of breath or fever.   ROS: Constitutional: Denies fever, chills, diaphoresis, appetite change. HEENT: Denies photophobia, eye pain, redness, hearing loss,  mouth sores, trouble swallowing, neck pain, neck stiffness and tinnitus.   Respiratory: Denies SOB, DOE, chest tightness,  and wheezing.   Cardiovascular: Denies chest pain, palpitations and leg swelling.  Gastrointestinal: Denies nausea, vomiting, abdominal pain, diarrhea, constipation, blood in stool and abdominal distention.  Genitourinary: Denies dysuria, urgency, frequency, hematuria, flank pain and difficulty urinating.  Endocrine: Denies: hot or cold intolerance, sweats, changes in hair or nails, polyuria, polydipsia. Musculoskeletal: Denies myalgias, back pain, joint swelling, arthralgias and gait problem.  Skin: Denies pallor, rash and wound.  Neurological: Denies dizziness, seizures, syncope, weakness, light-headedness, numbness and headaches.  Hematological: Denies adenopathy. Easy bruising, personal or family bleeding history  Psychiatric/Behavioral: Denies suicidal ideation, mood  changes, confusion, nervousness, sleep disturbance and agitation   Past Medical History:  Diagnosis Date   ASD (atrial septal defect) 2007   repaired with PFO in [place]   Echocardiogram abnormal 2013   EF 78-29%, normal diastolic parameters, mild mitral regur. with his post leaflet of MV calcified   History of stress test 2008   neg.for ischemia   HTN (hypertension)    MVP (mitral valve prolapse) 2007   mitral valve repair in [place]   Other and unspecified hyperlipidemia    Dr Ellouise Newer    Past Surgical History:  Procedure Laterality Date   CARDIAC CATHETERIZATION  2007   EF 65-70%, significant mitral prolapse wth 4+ MR, normal coronary arteries    COLONOSCOPY  2005 ; 2010;2014   polyp 2010 , Dr Earlean Shawl   HERNIA REPAIR  2007   I & D EXTREMITY     thumb, for infection   MITRAL VALVE REPAIR  2007   Robotic surgery @ [facility] , [place] and ASD repair and PFO repair   [place]   TURP , Dr Serita Butcher    Family History  Problem Relation Age of Onset   Hypertension Father    Cancer Father        throat ; smoker   Prostate cancer Father    Stroke Paternal Grandmother        late 44s   AAA (abdominal aortic aneurysm) Maternal Grandmother    Cancer Mother 77       ? intra-abdominal   Heart disease Neg Hx    Diabetes Neg Hx     SOCIAL HX:   reports that he quit smoking about 44 years ago. His smoking use included cigarettes. He has never used smokeless tobacco. He reports current alcohol use of about  14.0 standard drinks per week. He reports that he does not use drugs.   Current Outpatient Medications:    amoxicillin (AMOXIL) 500 MG capsule, SMARTSIG:4 Capsule(s) By Mouth Once, Disp: , Rfl:    aspirin 81 MG tablet, Take 81 mg by mouth daily., Disp: , Rfl:    Cholecalciferol (VITAMIN D3) 2000 UNITS TABS, Take by mouth daily., Disp: , Rfl:    fish oil-omega-3 fatty acids 1000 MG capsule, Take by mouth daily., Disp: , Rfl:    metoprolol succinate  (TOPROL-XL) 25 MG 24 hr tablet, Take 1/2 tablet (12.5mg  total) by mouth twice a day, Disp: 90 tablet, Rfl: 2   ramipril (ALTACE) 5 MG capsule, Take 1 capsule by mouth daily, Disp: 90 capsule, Rfl: 2   rosuvastatin (CRESTOR) 20 MG tablet, Take 1 tablet by mouth at bedtime, Disp: 90 tablet, Rfl: 2  EXAM:   VITALS per patient if applicable: None reported  GENERAL: alert, oriented, appears well and in no acute distress, sounds congested  HEENT: atraumatic, conjunttiva clear, no obvious abnormalities on inspection of external nose and ears  NECK: normal movements of the head and neck  LUNGS: on inspection no signs of respiratory distress, breathing rate appears normal, no obvious gross increased work of breathing, gasping or wheezing  CV: no obvious cyanosis  MS: moves all visible extremities without noticeable abnormality  PSYCH/NEURO: pleasant and cooperative, no obvious depression or anxiety, speech and thought processing grossly intact  ASSESSMENT AND PLAN:   COVID-19 -Given mild symptoms and timeline (day 4 of symptoms), we have decided not to prescribe antivirals and instead to treat symptomatically. -He may  use OTC medications such as antihistamines, decongestants, pain relievers, guaifenesin. -We have reviewed quarantine period of 5 days. -We have discussed symptoms that would promote ED evaluation. -He knows to follow with [place] if symptoms fail to resolve.     I discussed the assessment and treatment plan with the patient. The patient was provided an opportunity to ask questions and all were answered. The patient agreed with the plan and demonstrated an understanding of the instructions.   The patient was advised to call back or seek an in-person evaluation if the symptoms worsen or if the condition fails to improve as anticipated.    Lelon Frohlich, MD   Primary Care at [facility]

## 2021-06-19 NOTE — Telephone Encounter (Signed)
Pt is calling requesting a Rx for Antiviral. Tested POS 1/10 SXS started 1/7. Only Stuffy nose, Occasional cough, occasional sneezing.  Please update the pt.

## 2021-11-04 ENCOUNTER — Encounter: Payer: Self-pay | Admitting: Internal Medicine

## 2021-11-06 ENCOUNTER — Encounter: Payer: Self-pay | Admitting: Internal Medicine

## 2021-11-07 ENCOUNTER — Ambulatory Visit: Payer: PPO | Admitting: Internal Medicine

## 2021-11-12 ENCOUNTER — Other Ambulatory Visit (HOSPITAL_COMMUNITY): Payer: Self-pay

## 2021-11-16 ENCOUNTER — Other Ambulatory Visit (HOSPITAL_COMMUNITY): Payer: Self-pay

## 2021-11-17 ENCOUNTER — Encounter: Payer: Self-pay | Admitting: Cardiovascular Disease

## 2021-11-17 ENCOUNTER — Other Ambulatory Visit (HOSPITAL_COMMUNITY): Payer: Self-pay

## 2021-11-19 ENCOUNTER — Other Ambulatory Visit (HOSPITAL_COMMUNITY): Payer: Self-pay

## 2021-11-19 ENCOUNTER — Other Ambulatory Visit: Payer: Self-pay

## 2021-11-19 MED ORDER — ROSUVASTATIN CALCIUM 20 MG PO TABS
20.0000 mg | ORAL_TABLET | Freq: Every day | ORAL | 2 refills | Status: DC
  Filled 2021-11-19: qty 90, 90d supply, fill #0
  Filled 2022-02-18: qty 90, 90d supply, fill #1
  Filled 2022-05-15: qty 90, 90d supply, fill #2

## 2021-11-19 MED ORDER — METOPROLOL SUCCINATE ER 25 MG PO TB24
ORAL_TABLET | ORAL | 2 refills | Status: DC
  Filled 2021-11-19: qty 90, 90d supply, fill #0
  Filled 2022-02-18: qty 90, 90d supply, fill #1
  Filled 2022-05-15: qty 90, 90d supply, fill #2

## 2021-11-19 MED ORDER — RAMIPRIL 5 MG PO CAPS
5.0000 mg | ORAL_CAPSULE | Freq: Every day | ORAL | 2 refills | Status: DC
  Filled 2021-11-19: qty 90, 90d supply, fill #0
  Filled 2022-02-18: qty 90, 90d supply, fill #1
  Filled 2022-05-15: qty 90, 90d supply, fill #2

## 2021-11-19 NOTE — Telephone Encounter (Signed)
Refill sent to pharmacy.   

## 2021-11-29 ENCOUNTER — Encounter: Payer: Self-pay | Admitting: Internal Medicine

## 2022-02-18 ENCOUNTER — Encounter: Payer: Self-pay | Admitting: Internal Medicine

## 2022-02-18 ENCOUNTER — Other Ambulatory Visit (HOSPITAL_COMMUNITY): Payer: Self-pay

## 2022-02-25 ENCOUNTER — Encounter: Payer: Self-pay | Admitting: Internal Medicine

## 2022-04-16 ENCOUNTER — Encounter: Payer: Self-pay | Admitting: Cardiovascular Disease

## 2022-05-08 ENCOUNTER — Ambulatory Visit (HOSPITAL_COMMUNITY): Payer: PPO | Attending: Cardiovascular Disease

## 2022-05-08 DIAGNOSIS — R7303 Prediabetes: Secondary | ICD-10-CM | POA: Diagnosis not present

## 2022-05-08 DIAGNOSIS — I10 Essential (primary) hypertension: Secondary | ICD-10-CM | POA: Diagnosis not present

## 2022-05-08 DIAGNOSIS — Z9889 Other specified postprocedural states: Secondary | ICD-10-CM

## 2022-05-08 DIAGNOSIS — Z87891 Personal history of nicotine dependence: Secondary | ICD-10-CM | POA: Insufficient documentation

## 2022-05-08 DIAGNOSIS — I77810 Thoracic aortic ectasia: Secondary | ICD-10-CM | POA: Diagnosis not present

## 2022-05-08 DIAGNOSIS — E785 Hyperlipidemia, unspecified: Secondary | ICD-10-CM | POA: Diagnosis not present

## 2022-05-08 LAB — ECHOCARDIOGRAM COMPLETE
Area-P 1/2: 2.13 cm2
S' Lateral: 2.95 cm

## 2022-05-15 ENCOUNTER — Other Ambulatory Visit (HOSPITAL_COMMUNITY): Payer: Self-pay

## 2022-05-16 ENCOUNTER — Other Ambulatory Visit (HOSPITAL_COMMUNITY): Payer: Self-pay

## 2022-05-24 ENCOUNTER — Ambulatory Visit: Payer: PPO | Attending: Nurse Practitioner | Admitting: Nurse Practitioner

## 2022-05-24 ENCOUNTER — Encounter: Payer: Self-pay | Admitting: Nurse Practitioner

## 2022-05-24 VITALS — BP 124/72 | HR 61 | Ht 72.0 in | Wt 201.0 lb

## 2022-05-24 DIAGNOSIS — I10 Essential (primary) hypertension: Secondary | ICD-10-CM

## 2022-05-24 DIAGNOSIS — E782 Mixed hyperlipidemia: Secondary | ICD-10-CM

## 2022-05-24 DIAGNOSIS — Z9889 Other specified postprocedural states: Secondary | ICD-10-CM

## 2022-05-24 DIAGNOSIS — I77810 Thoracic aortic ectasia: Secondary | ICD-10-CM

## 2022-05-24 DIAGNOSIS — I080 Rheumatic disorders of both mitral and aortic valves: Secondary | ICD-10-CM | POA: Diagnosis not present

## 2022-05-24 DIAGNOSIS — Z8774 Personal history of (corrected) congenital malformations of heart and circulatory system: Secondary | ICD-10-CM | POA: Diagnosis not present

## 2022-05-24 NOTE — Patient Instructions (Signed)
Medication Instructions:  Your physician recommends that you continue on your current medications as directed. Please refer to the Current Medication list given to you today.   *If you need a refill on your cardiac medications before your next appointment, please call your pharmacy*   Lab Work: NONE ordered at this time of appointment   If you have labs (blood work) drawn today and your tests are completely normal, you will receive your results only by: MyChart Message (if you have MyChart) OR A paper copy in the mail If you have any lab test that is abnormal or we need to change your treatment, we will call you to review the results.   Testing/Procedures: NONE ordered at this time of appointment     Follow-Up: At [facility], you and your health needs are our priority.  As part of our continuing mission to provide you with exceptional heart care, we have created designated Provider Care Teams.  These Care Teams include your primary Cardiologist (physician) and Advanced Practice Providers (APPs -  Physician Assistants and Nurse Practitioners) who all work together to provide you with the care you need, when you need it.  We recommend signing up for the patient portal called "MyChart".  Sign up information is provided on this After Visit Summary.  MyChart is used to connect with patients for Virtual Visits (Telemedicine).  Patients are able to view lab/test results, encounter notes, upcoming appointments, etc.  Non-urgent messages can be sent to your provider as well.   To learn more about what you can do with MyChart, go to [email or web address].    Your next appointment:   1 year(s)  The format for your next appointment:   In Person  Provider:   Thomas Kelly, MD     Other Instructions   Important Information About Sugar       

## 2022-05-24 NOTE — Progress Notes (Signed)
Office Visit    Patient Name: William Lewis Date of Encounter: 05/24/2022  Primary Care Provider:  Binnie Rail, MD Primary Cardiologist:  Shelva Majestic, MD  Chief Complaint    71 year old male with a history of mitral valve prolapse, MV regurgitation s/p mitral valve repair in 2007, small atrial septal defect/PFO repair, hypertension, hyperlipidemia, mild dilation of ascending aorta, prediabetes and prostate cancer who presents for follow-up related to mitral valve regurgitation.   Past Medical History    Past Medical History:  Diagnosis Date   ASD (atrial septal defect) 2007   repaired with PFO in [place]   Echocardiogram abnormal 2013   EF 93-79%, normal diastolic parameters, mild mitral regur. with his post leaflet of MV calcified   History of stress test 2008   neg.for ischemia   HTN (hypertension)    MVP (mitral valve prolapse) 2007   mitral valve repair in [place]   Other and unspecified hyperlipidemia    Dr Ellouise Newer   Past Surgical History:  Procedure Laterality Date   CARDIAC CATHETERIZATION  2007   EF 65-70%, significant mitral prolapse wth 4+ MR, normal coronary arteries    COLONOSCOPY  2005 ; 2010;2014   polyp 2010 , Dr Earlean Shawl   HERNIA REPAIR  2007   I & D EXTREMITY     thumb, for infection   MITRAL VALVE REPAIR  2007   Robotic surgery @ [address] [place] , [place] and ASD repair and PFO repair   [place]   TURP , Dr Serita Butcher    Allergies  No Known Allergies  History of Present Illness    71 year old male with the above past medical history including mitral valve prolapse/mitral valve regurgitation s/p mitral valve repair in 2007, small atrial septal defect/PFO repair, hypertension, hyperlipidemia, mild dilation of ascending aorta, prediabetes and prostate cancer.  He underwent robotic mitral valve repair surgery in 04/2006 for severe MR due to ruptured chordae in addition to repair of a small ASD/PFO.  Preoperatively he was noted to have  normal coronary arteries.  He was last seen in the office on 05/08/2021 and was stable from a cardiac standpoint.  Most recent echocardiogram in 04/2022 showed EF 60 to 65%, normal LV function, no RWMA, indeterminate diastolic parameters, normal RV systolic function moderately dilated left atrium, stable mitral valve repair with no significant mitral valve regurgitation, no evidence of mitral stenosis, mild dilation of ascending aorta measuring 39 mm.  He presents today for follow-up.  Since his last visit done well from a cardiac standpoint.  He does note that this has been a difficult year for him as his wife died 6 months ago.  Since this time he has been working on intentional weight loss through diet and exercise.  He has lost over 20 pounds.  He is asking about the possibility of discontinuing some of his medications in the future.  His BP has been well-controlled.  He denies chest pain, dyspnea, dizziness, edema, PND, orthopnea, weight gain.  Aside from grieving the loss of his wife, he reports feeling well.   Home Medications    Current Outpatient Medications  Medication Sig Dispense Refill   amoxicillin (AMOXIL) 500 MG capsule SMARTSIG:4 Capsule(s) By Mouth Once     aspirin 81 MG tablet Take 81 mg by mouth daily.     Cholecalciferol (VITAMIN D3) 2000 UNITS TABS Take by mouth daily.     fish oil-omega-3 fatty acids 1000 MG capsule Take by mouth daily.  metoprolol succinate (TOPROL-XL) 25 MG 24 hr tablet Take 1/2 tablet (12.'5mg'$  total) by mouth twice a day 90 tablet 2   ramipril (ALTACE) 5 MG capsule Take 1 capsule (5 mg total) by mouth daily. 90 capsule 2   rosuvastatin (CRESTOR) 20 MG tablet Take 1 tablet (20 mg total) by mouth at bedtime. 90 tablet 2   No current facility-administered medications for this visit.     Review of Systems    He denies chest pain, palpitations, dyspnea, pnd, orthopnea, n, v, dizziness, syncope, edema, weight gain, or early satiety. All other systems  reviewed and are otherwise negative except as noted above.   Physical Exam    VS:  BP 124/72   Pulse 61   Ht 6' (1.829 m)   Wt 201 lb (91.2 kg)   SpO2 99%   BMI 27.26 kg/m  GEN: Well nourished, well developed, in no acute distress. HEENT: normal. Neck: Supple, no JVD, carotid bruits, or masses. Cardiac: RRR, no murmurs, rubs, or gallops. No clubbing, cyanosis, edema.  Radials/DP/PT 2+ and equal bilaterally.  Respiratory:  Respirations regular and unlabored, clear to auscultation bilaterally. GI: Soft, nontender, nondistended, BS + x 4. MS: no deformity or atrophy. Skin: warm and dry, no rash. Neuro:  Strength and sensation are intact. Psych: Normal affect.  Accessory Clinical Findings    ECG personally reviewed by me today -NSR, 61 bpm, sinus arrhythmia, incomplete RBBB - no acute changes.   Lab Results  Component Value Date   WBC 5.5 06/13/2021   HGB 14.0 06/13/2021   HCT 43.6 06/13/2021   MCV 88.2 06/13/2021   PLT 217.0 06/13/2021   Lab Results  Component Value Date   CREATININE 1.00 06/13/2021   BUN 18 06/13/2021   NA 138 06/13/2021   K 4.2 06/13/2021   CL 104 06/13/2021   CO2 24 06/13/2021   Lab Results  Component Value Date   ALT 12 06/13/2021   AST 15 06/13/2021   ALKPHOS 47 06/13/2021   BILITOT 0.6 06/13/2021   Lab Results  Component Value Date   CHOL 164 06/13/2021   HDL 44.70 06/13/2021   LDLCALC 82 05/03/2020   LDLDIRECT 90.0 06/13/2021   TRIG 201.0 (H) 06/13/2021   CHOLHDL 4 06/13/2021    Lab Results  Component Value Date   HGBA1C 6.4 06/13/2021    Assessment & Plan    1. History of mitral valve prolapse/mitral valve regurgitation: S/p mitral valve repair in 2007. Most recent echo in 04/2022 showed EF 60 to 65%, normal LV function, no RWMA, indeterminate diastolic parameters, normal RV systolic function moderately dilated left atrium, stable mitral valve repair with no significant mitral valve regurgitation, no evidence of mitral stenosis,  mild dilation of ascending aorta measuring 39 mm.  Appears he has had a repeat echo every 2 years.  I will reach out to Dr. Claiborne Billings to see if he is okay with repeating echo in 2 years vs. 1 year.  Continue SBE prophylaxis.    2. ASD/PFO: S/p repair in 2007.   3. Hypertension: BP well controlled.  Continue to monitor BP with weight loss.  For now, continue current antihypertensive regimen.   4. Hyperlipidemia: dLDL was 90 in 06/2021. Monitored and managed per PCP. The 10-year ASCVD risk score (Arnett DK, et al., 2019) is: 23.2%. Continue aspirin, Crestor.  5. Mild dilation of ascending aorta: Measured 39 mm on most recent echo. Stable.  Will likely continue to monitor with routine echocardiogram in the setting of mitral  valve repair.  6. Disposition: Follow-up in 1 year.      Lenna Sciara, NP 05/24/2022, 1:58 PM

## 2022-06-12 ENCOUNTER — Encounter: Payer: Self-pay | Admitting: Internal Medicine

## 2022-06-12 DIAGNOSIS — E559 Vitamin D deficiency, unspecified: Secondary | ICD-10-CM

## 2022-06-12 DIAGNOSIS — I10 Essential (primary) hypertension: Secondary | ICD-10-CM

## 2022-06-12 DIAGNOSIS — E782 Mixed hyperlipidemia: Secondary | ICD-10-CM

## 2022-06-12 DIAGNOSIS — R7303 Prediabetes: Secondary | ICD-10-CM

## 2022-06-12 DIAGNOSIS — E038 Other specified hypothyroidism: Secondary | ICD-10-CM

## 2022-06-14 ENCOUNTER — Other Ambulatory Visit (INDEPENDENT_AMBULATORY_CARE_PROVIDER_SITE_OTHER): Payer: PPO

## 2022-06-14 DIAGNOSIS — E559 Vitamin D deficiency, unspecified: Secondary | ICD-10-CM

## 2022-06-14 DIAGNOSIS — E038 Other specified hypothyroidism: Secondary | ICD-10-CM

## 2022-06-14 DIAGNOSIS — R7303 Prediabetes: Secondary | ICD-10-CM | POA: Diagnosis not present

## 2022-06-14 DIAGNOSIS — E782 Mixed hyperlipidemia: Secondary | ICD-10-CM

## 2022-06-14 DIAGNOSIS — I10 Essential (primary) hypertension: Secondary | ICD-10-CM | POA: Diagnosis not present

## 2022-06-14 LAB — COMPREHENSIVE METABOLIC PANEL
ALT: 13 U/L (ref 0–53)
AST: 16 U/L (ref 0–37)
Albumin: 4.1 g/dL (ref 3.5–5.2)
Alkaline Phosphatase: 42 U/L (ref 39–117)
BUN: 18 mg/dL (ref 6–23)
CO2: 25 mEq/L (ref 19–32)
Calcium: 9.2 mg/dL (ref 8.4–10.5)
Chloride: 102 mEq/L (ref 96–112)
Creatinine, Ser: 1.04 mg/dL (ref 0.40–1.50)
GFR: 71.39 mL/min (ref >60.00)
Glucose, Bld: 96 mg/dL (ref 70–99)
Potassium: 4.4 mEq/L (ref 3.5–5.1)
Sodium: 138 mEq/L (ref 135–145)
Total Bilirubin: 0.4 mg/dL (ref 0.2–1.2)
Total Protein: 6.5 g/dL (ref 6.0–8.3)

## 2022-06-14 LAB — LIPID PANEL
Cholesterol: 121 mg/dL (ref 0–200)
HDL: 49.3 mg/dL (ref >39.00)
LDL Cholesterol: 60 mg/dL (ref 0–99)
NonHDL: 71.31
Total CHOL/HDL Ratio: 2
Triglycerides: 58 mg/dL (ref 0.0–149.0)
VLDL: 11.6 mg/dL (ref 0.0–40.0)

## 2022-06-14 LAB — CBC WITH DIFFERENTIAL/PLATELET
Basophils Absolute: 0 10*3/uL (ref 0.0–0.1)
Basophils Relative: 0.8 % (ref 0.0–3.0)
Eosinophils Absolute: 0.2 10*3/uL (ref 0.0–0.7)
Eosinophils Relative: 3 % (ref 0.0–5.0)
HCT: 42.4 % (ref 39.0–52.0)
Hemoglobin: 13.9 g/dL (ref 13.0–17.0)
Lymphocytes Relative: 21.9 % (ref 12.0–46.0)
Lymphs Abs: 1.2 10*3/uL (ref 0.7–4.0)
MCHC: 32.9 g/dL (ref 30.0–36.0)
MCV: 88.7 fl (ref 78.0–100.0)
Monocytes Absolute: 0.5 10*3/uL (ref 0.1–1.0)
Monocytes Relative: 9.5 % (ref 3.0–12.0)
Neutro Abs: 3.7 10*3/uL (ref 1.4–7.7)
Neutrophils Relative %: 64.8 % (ref 43.0–77.0)
Platelets: 303 10*3/uL (ref 150.0–400.0)
RBC: 4.78 Mil/uL (ref 4.22–5.81)
RDW: 14.5 % (ref 11.5–15.5)
WBC: 5.7 10*3/uL (ref 4.0–10.5)

## 2022-06-14 LAB — TSH: TSH: 4.2 u[IU]/mL (ref 0.35–5.50)

## 2022-06-14 LAB — HEMOGLOBIN A1C: Hgb A1c MFr Bld: 6.3 % (ref 4.6–6.5)

## 2022-06-14 LAB — VITAMIN D 25 HYDROXY (VIT D DEFICIENCY, FRACTURES): VITD: 46.92 ng/mL (ref 30.00–100.00)

## 2022-06-16 ENCOUNTER — Encounter: Payer: Self-pay | Admitting: Internal Medicine

## 2022-06-16 NOTE — Patient Instructions (Addendum)
Your blood work looks good.   Medications changes include :   None    Return in about 1 year (around 06/18/2023) for Physical Exam.    Health Maintenance, Male Adopting a healthy lifestyle and getting preventive care are important in promoting health and wellness. Ask your health care provider about: The right schedule for you to have regular tests and exams. Things you can do on your own to prevent diseases and keep yourself healthy. What should I know about diet, weight, and exercise? Eat a healthy diet  Eat a diet that includes plenty of vegetables, fruits, low-fat dairy products, and lean protein. Do not eat a lot of foods that are high in solid fats, added sugars, or sodium. Maintain a healthy weight Body mass index (BMI) is a measurement that can be used to identify possible weight problems. It estimates body fat based on height and weight. Your health care provider can help determine your BMI and help you achieve or maintain a healthy weight. Get regular exercise Get regular exercise. This is one of the most important things you can do for your health. Most adults should: Exercise for at least 150 minutes each week. The exercise should increase your heart rate and make you sweat (moderate-intensity exercise). Do strengthening exercises at least twice a week. This is in addition to the moderate-intensity exercise. Spend less time sitting. Even light physical activity can be beneficial. Watch cholesterol and blood lipids Have your blood tested for lipids and cholesterol at 73 years of age, then have this test every 5 years. You may need to have your cholesterol levels checked more often if: Your lipid or cholesterol levels are high. You are older than 74 years of age. You are at high risk for heart disease. What should I know about cancer screening? Many types of cancers can be detected early and may often be prevented. Depending on your health history and family history,  you may need to have cancer screening at various ages. This may include screening for: Colorectal cancer. Prostate cancer. Skin cancer. Lung cancer. What should I know about heart disease, diabetes, and high blood pressure? Blood pressure and heart disease High blood pressure causes heart disease and increases the risk of stroke. This is more likely to develop in people who have high blood pressure readings or are overweight. Talk with your health care provider about your target blood pressure readings. Have your blood pressure checked: Every 3-5 years if you are 1-30 years of age. Every year if you are 64 years old or older. If you are between the ages of 81 and 42 and are a current or former smoker, ask your health care provider if you should have a one-time screening for abdominal aortic aneurysm (AAA). Diabetes Have regular diabetes screenings. This checks your fasting blood sugar level. Have the screening done: Once every three years after age 57 if you are at a normal weight and have a low risk for diabetes. More often and at a younger age if you are overweight or have a high risk for diabetes. What should I know about preventing infection? Hepatitis B If you have a higher risk for hepatitis B, you should be screened for this virus. Talk with your health care provider to find out if you are at risk for hepatitis B infection. Hepatitis C Blood testing is recommended for: Everyone born from [birth year] through 1967. Anyone with known risk factors for hepatitis C. Sexually transmitted infections (STIs) You should be  screened each year for STIs, including gonorrhea and chlamydia, if: You are sexually active and are younger than 74 years of age. You are older than 74 years of age and your health care provider tells you that you are at risk for this type of infection. Your sexual activity has changed since you were last screened, and you are at increased risk for chlamydia or gonorrhea. Ask  your health care provider if you are at risk. Ask your health care provider about whether you are at high risk for HIV. Your health care provider may recommend a prescription medicine to help prevent HIV infection. If you choose to take medicine to prevent HIV, you should first get tested for HIV. You should then be tested every 3 months for as long as you are taking the medicine. Follow these instructions at home: Alcohol use Do not drink alcohol if your health care provider tells you not to drink. If you drink alcohol: Limit how much you have to 0-2 drinks a day. Know how much alcohol is in your drink. In the [place] one drink equals one 12 oz bottle of beer (355 mL), one 5 oz glass of wine (148 mL), or one 1 oz glass of hard liquor (44 mL). Lifestyle Do not use any products that contain nicotine or tobacco. These products include cigarettes, chewing tobacco, and vaping devices, such as e-cigarettes. If you need help quitting, ask your health care provider. Do not use street drugs. Do not share needles. Ask your health care provider for help if you need support or information about quitting drugs. General instructions Schedule regular health, dental, and eye exams. Stay current with your vaccines. Tell your health care provider if: You often feel depressed. You have ever been abused or do not feel safe at home. Summary Adopting a healthy lifestyle and getting preventive care are important in promoting health and wellness. Follow your health care provider's instructions about healthy diet, exercising, and getting tested or screened for diseases. Follow your health care provider's instructions on monitoring your cholesterol and blood pressure. This information is not intended to replace advice given to you by your health care provider. Make sure you discuss any questions you have with your health care provider. Document Revised: 10/16/2020 Document Reviewed: 10/16/2020 Elsevier Patient  Education  [place].

## 2022-06-16 NOTE — Progress Notes (Unsigned)
Subjective:    Patient ID: William Lewis, male    DOB: May 08, 1951, 72 y.o.   MRN: [id number]     HPI William Lewis is here for a physical exam.   Overall doing well.  His wife died last year of metastatic cancer.   Medications and allergies reviewed with patient and updated if appropriate.  Current Outpatient Medications on File Prior to Visit  Medication Sig Dispense Refill   amoxicillin (AMOXIL) 500 MG capsule SMARTSIG:4 Capsule(s) By Mouth Once     aspirin 81 MG tablet Take 81 mg by mouth daily.     Cholecalciferol (VITAMIN D3) 2000 UNITS TABS Take by mouth daily.     fish oil-omega-3 fatty acids 1000 MG capsule Take by mouth daily.     metoprolol succinate (TOPROL-XL) 25 MG 24 hr tablet Take 1/2 tablet (12.'5mg'$  total) by mouth twice a day 90 tablet 2   ramipril (ALTACE) 5 MG capsule Take 1 capsule (5 mg total) by mouth daily. 90 capsule 2   rosuvastatin (CRESTOR) 20 MG tablet Take 1 tablet (20 mg total) by mouth at bedtime. 90 tablet 2   No current facility-administered medications on file prior to visit.    Review of Systems  Constitutional:  Negative for chills and fever.  Eyes:  Negative for visual disturbance.  Respiratory:  Negative for cough, shortness of breath and wheezing.   Cardiovascular:  Negative for chest pain, palpitations and leg swelling.  Gastrointestinal:  Negative for abdominal pain, blood in stool, constipation, diarrhea and nausea.       No gerd  Genitourinary:  Negative for dysuria and hematuria.  Musculoskeletal:  Negative for arthralgias and back pain.  Skin:  Negative for rash.  Neurological:  Negative for light-headedness and headaches.  Psychiatric/Behavioral:  Negative for dysphoric mood. The patient is not nervous/anxious.        Objective:   Vitals:   06/17/22 0819 06/17/22 0917  BP: 116/68   Pulse: 65 60  Temp: 97.9 F (36.6 C)   SpO2: 93% 97%   Filed Weights   06/17/22 0819  Weight: 195 lb (88.5 kg)   Body mass index is 26.45  kg/m.  BP Readings from Last 3 Encounters:  06/17/22 116/68  05/24/22 124/72  06/15/21 118/70    Wt Readings from Last 3 Encounters:  06/17/22 195 lb (88.5 kg)  05/24/22 201 lb (91.2 kg)  06/15/21 216 lb (98 kg)      Physical Exam Constitutional: He appears well-developed and well-nourished. No distress.  HENT:  Head: Normocephalic and atraumatic.  Right Ear: External ear normal.  Left Ear: External ear normal.  Mouth/Throat: Oropharynx is clear and moist.  Normal ear canals and TM b/l  Eyes: Conjunctivae and EOM are normal.  Neck: Neck supple. No tracheal deviation present. No thyromegaly present.  No carotid bruit  Cardiovascular: Normal rate, regular rhythm, normal heart sounds and intact distal pulses.   No murmur heard. Pulmonary/Chest: Effort normal and breath sounds normal. No respiratory distress. He has no wheezes. He has no rales.  Abdominal: Soft. He exhibits no distension. There is no tenderness.  Genitourinary: deferred  Musculoskeletal: He exhibits no edema.  Lymphadenopathy:   He has no cervical adenopathy.  Skin: Skin is warm and dry. He is not diaphoretic.  Psychiatric: He has a normal mood and affect. His behavior is normal.    Lab Results  Component Value Date   WBC 5.7 06/14/2022   HGB 13.9 06/14/2022   HCT 42.4 06/14/2022  PLT 303.0 06/14/2022   GLUCOSE 96 06/14/2022   CHOL 121 06/14/2022   TRIG 58.0 06/14/2022   HDL 49.30 06/14/2022   LDLDIRECT 90.0 06/13/2021   LDLCALC 60 06/14/2022   ALT 13 06/14/2022   AST 16 06/14/2022   NA 138 06/14/2022   K 4.4 06/14/2022   CL 102 06/14/2022   CREATININE 1.04 06/14/2022   BUN 18 06/14/2022   CO2 25 06/14/2022   TSH 4.20 06/14/2022   PSA 0.1 05/15/2016   HGBA1C 6.3 06/14/2022        Assessment & Plan:   Physical exam: Screening blood work  reviewed Exercise   regular - swimming, tennis, golf, trainer 2/week Weight  normal Substance abuse   none   Reviewed recommended  immunizations.   Health Maintenance  Topic Date Due   Medicare Annual Wellness (AWV)  06/15/2022   COVID-19 Vaccine (7 - 2023-24 season) 07/03/2022 (Originally 04/22/2022)   COLONOSCOPY (Pts 45-87yr Insurance coverage will need to be confirmed)  11/18/2022   DTaP/Tdap/Td (3 - Td or Tdap) 10/27/2028   Pneumonia Vaccine 74 Years old  Completed   INFLUENZA VACCINE  Completed   Hepatitis C Screening  Completed   Zoster Vaccines- Shingrix  Completed   HPV VACCINES  Aged Out     See Problem List for Assessment and Plan of chronic medical problems.

## 2022-06-17 ENCOUNTER — Ambulatory Visit (INDEPENDENT_AMBULATORY_CARE_PROVIDER_SITE_OTHER): Payer: PPO | Admitting: Internal Medicine

## 2022-06-17 VITALS — BP 116/68 | HR 60 | Temp 97.9°F | Ht 72.0 in | Wt 195.0 lb

## 2022-06-17 DIAGNOSIS — E038 Other specified hypothyroidism: Secondary | ICD-10-CM | POA: Diagnosis not present

## 2022-06-17 DIAGNOSIS — Z0000 Encounter for general adult medical examination without abnormal findings: Secondary | ICD-10-CM | POA: Diagnosis not present

## 2022-06-17 DIAGNOSIS — E559 Vitamin D deficiency, unspecified: Secondary | ICD-10-CM | POA: Diagnosis not present

## 2022-06-17 DIAGNOSIS — E782 Mixed hyperlipidemia: Secondary | ICD-10-CM | POA: Diagnosis not present

## 2022-06-17 DIAGNOSIS — R7303 Prediabetes: Secondary | ICD-10-CM | POA: Diagnosis not present

## 2022-06-17 DIAGNOSIS — I10 Essential (primary) hypertension: Secondary | ICD-10-CM

## 2022-06-17 NOTE — Assessment & Plan Note (Signed)
Chronic Continue regular exercise and healthy diet  LDL well-controlled-60 Continue Crestor 20 mg daily

## 2022-06-17 NOTE — Assessment & Plan Note (Signed)
Chronic He is taking vitamin D supplementation Vitamin D level within normal range  Last vitamin D Lab Results  Component Value Date   VD25OH 46.92 06/14/2022

## 2022-06-17 NOTE — Assessment & Plan Note (Signed)
Chronic Blood pressure well controlled CMP normal Continue metoprolol XL 12.5 mg twice daily, ramipril 5 mg daily

## 2022-06-17 NOTE — Assessment & Plan Note (Signed)
Chronic TSH in normal range Clinically euthyroid Will monitor annually

## 2022-06-17 NOTE — Assessment & Plan Note (Signed)
Chronic A1c 6.3% Continue low sugar/carbohydrate diet Continue regular exercise

## 2022-06-24 ENCOUNTER — Telehealth: Payer: Self-pay | Admitting: Internal Medicine

## 2022-06-24 NOTE — Telephone Encounter (Signed)
N/A unable to leave a message for patient to call back to schedule Medicare Annual Wellness Visit   Last AWV  06/15/21  Please schedule at anytime with [facility] if patient calls the office back.    30 Minutes appointment   Any questions, please call me at [phone]

## 2022-07-30 DIAGNOSIS — D235 Other benign neoplasm of skin of trunk: Secondary | ICD-10-CM | POA: Diagnosis not present

## 2022-07-30 DIAGNOSIS — Z85828 Personal history of other malignant neoplasm of skin: Secondary | ICD-10-CM | POA: Diagnosis not present

## 2022-07-30 DIAGNOSIS — D1801 Hemangioma of skin and subcutaneous tissue: Secondary | ICD-10-CM | POA: Diagnosis not present

## 2022-07-30 DIAGNOSIS — L853 Xerosis cutis: Secondary | ICD-10-CM | POA: Diagnosis not present

## 2022-07-30 DIAGNOSIS — L570 Actinic keratosis: Secondary | ICD-10-CM | POA: Diagnosis not present

## 2022-07-30 DIAGNOSIS — D485 Neoplasm of uncertain behavior of skin: Secondary | ICD-10-CM | POA: Diagnosis not present

## 2022-07-30 DIAGNOSIS — D2271 Melanocytic nevi of right lower limb, including hip: Secondary | ICD-10-CM | POA: Diagnosis not present

## 2022-07-30 DIAGNOSIS — L821 Other seborrheic keratosis: Secondary | ICD-10-CM | POA: Diagnosis not present

## 2022-08-10 ENCOUNTER — Other Ambulatory Visit: Payer: Self-pay | Admitting: Cardiovascular Disease

## 2022-08-12 ENCOUNTER — Other Ambulatory Visit (HOSPITAL_COMMUNITY): Payer: Self-pay

## 2022-08-12 MED ORDER — RAMIPRIL 5 MG PO CAPS
5.0000 mg | ORAL_CAPSULE | Freq: Every day | ORAL | 2 refills | Status: DC
  Filled 2022-08-12: qty 90, 90d supply, fill #0
  Filled 2022-11-15: qty 90, 90d supply, fill #1

## 2022-08-12 MED ORDER — METOPROLOL SUCCINATE ER 25 MG PO TB24
ORAL_TABLET | ORAL | 2 refills | Status: DC
  Filled 2022-08-12: qty 90, 90d supply, fill #0
  Filled 2022-11-15: qty 90, 90d supply, fill #1
  Filled 2023-04-16: qty 90, 90d supply, fill #2

## 2022-08-12 MED ORDER — ROSUVASTATIN CALCIUM 20 MG PO TABS
20.0000 mg | ORAL_TABLET | Freq: Every day | ORAL | 2 refills | Status: DC
  Filled 2022-08-12: qty 90, 90d supply, fill #0
  Filled 2022-11-15: qty 90, 90d supply, fill #1
  Filled 2023-04-16: qty 90, 90d supply, fill #2

## 2022-10-22 ENCOUNTER — Encounter: Payer: Self-pay | Admitting: Internal Medicine

## 2022-10-22 DIAGNOSIS — Z1211 Encounter for screening for malignant neoplasm of colon: Secondary | ICD-10-CM

## 2022-11-05 DIAGNOSIS — Z85828 Personal history of other malignant neoplasm of skin: Secondary | ICD-10-CM | POA: Diagnosis not present

## 2022-11-05 DIAGNOSIS — D2271 Melanocytic nevi of right lower limb, including hip: Secondary | ICD-10-CM | POA: Diagnosis not present

## 2022-11-15 ENCOUNTER — Encounter: Payer: Self-pay | Admitting: Gastroenterology

## 2022-11-15 ENCOUNTER — Telehealth: Payer: Self-pay | Admitting: Gastroenterology

## 2022-11-15 NOTE — Telephone Encounter (Signed)
Chart reviews. Last exam done 11/2017 with Dr. Kinnie Scales. That exam was normal. He was told to repeat it in 5 years given history of polyps. Okay to be booked directly with me at the [facility] for this if he meets criteria for the [facility] otherwise. Up to him if he wants anesthesia or not, if he does not want anesthesia we can do that.

## 2022-11-15 NOTE — Telephone Encounter (Signed)
Good afternoon Dr. Adela Lank  The following patient was referred to [place] for a colonoscopy. He is a former [facility] patient and no longer seeking care there since he has retired. He requested you as his provider and wanted to make [place] aware he does not want to use anesthesia with the colonoscopy. Records are available in Epic. Please review and advise of scheduling. Thank you

## 2022-11-15 NOTE — Telephone Encounter (Signed)
Scheduled for 8/13 at 930am

## 2022-11-18 ENCOUNTER — Encounter: Payer: Self-pay | Admitting: Gastroenterology

## 2022-12-17 ENCOUNTER — Other Ambulatory Visit (HOSPITAL_COMMUNITY): Payer: Self-pay

## 2022-12-17 ENCOUNTER — Encounter: Payer: Self-pay | Admitting: Gastroenterology

## 2022-12-17 ENCOUNTER — Ambulatory Visit (AMBULATORY_SURGERY_CENTER): Payer: PPO

## 2022-12-17 VITALS — Ht 72.0 in | Wt 190.0 lb

## 2022-12-17 DIAGNOSIS — Z86010 Personal history of colonic polyps: Secondary | ICD-10-CM

## 2022-12-17 MED ORDER — PEG 3350-KCL-NA BICARB-NACL 420 G PO SOLR
4000.0000 mL | Freq: Once | ORAL | 0 refills | Status: AC
  Filled 2022-12-17: qty 4000, 1d supply, fill #0

## 2022-12-17 NOTE — Progress Notes (Signed)

## 2023-01-04 ENCOUNTER — Other Ambulatory Visit (HOSPITAL_COMMUNITY): Payer: Self-pay

## 2023-01-04 DIAGNOSIS — K0501 Acute gingivitis, non-plaque induced: Secondary | ICD-10-CM | POA: Diagnosis not present

## 2023-01-04 DIAGNOSIS — R21 Rash and other nonspecific skin eruption: Secondary | ICD-10-CM | POA: Diagnosis not present

## 2023-01-04 MED ORDER — DOXYCYCLINE HYCLATE 100 MG PO TABS
ORAL_TABLET | ORAL | 0 refills | Status: DC
  Filled 2023-01-04: qty 14, 7d supply, fill #0

## 2023-01-04 MED ORDER — CHLORHEXIDINE GLUCONATE 0.12 % MT SOLN
OROMUCOSAL | 0 refills | Status: DC
  Filled 2023-01-04: qty 473, 16d supply, fill #0

## 2023-01-06 ENCOUNTER — Encounter: Payer: Self-pay | Admitting: Internal Medicine

## 2023-01-06 ENCOUNTER — Ambulatory Visit (INDEPENDENT_AMBULATORY_CARE_PROVIDER_SITE_OTHER): Payer: PPO | Admitting: Internal Medicine

## 2023-01-06 VITALS — BP 120/68 | HR 72 | Temp 98.6°F | Ht 72.0 in | Wt 190.0 lb

## 2023-01-06 DIAGNOSIS — R21 Rash and other nonspecific skin eruption: Secondary | ICD-10-CM

## 2023-01-06 DIAGNOSIS — K140 Glossitis: Secondary | ICD-10-CM | POA: Diagnosis not present

## 2023-01-06 DIAGNOSIS — L738 Other specified follicular disorders: Secondary | ICD-10-CM | POA: Diagnosis not present

## 2023-01-06 NOTE — Progress Notes (Signed)
Subjective:    Patient ID: William Lewis, male    DOB: 1950/07/09, 72 y.o.   MRN: [id number]      HPI William Lewis is here for  Chief Complaint  Patient presents with   Follow-up    Wants to follow up on Urgent Care visit on from tongue bite and gum pain    Went to urgent care 7/27 - in [place] bit tongue and bit it a second time worse - it was the tip of the tongue.  Developed an ulcer. It was not healing - used oral otc medication - helped minimally.    Rash developed last Friday - Saturday am. On face - a little itchy.   Better with doxycycline.  Gums were sensitive, tip of tongue sensitive.    Urgent care - doxycycline, peridex - taking these x couple of days - improvement    Rash intermittent x 4-5 months throughout body - itches.  Otc cortisone cream works.    Medications and allergies reviewed with patient and updated if appropriate.  Current Outpatient Medications on File Prior to Visit  Medication Sig Dispense Refill   aspirin 81 MG tablet Take 81 mg by mouth daily.     chlorhexidine (PERIDEX) 0.12 % solution Use 15 mL in the mouth or throat 2 (two) times a day. 473 mL 0   Cholecalciferol (VITAMIN D3) 2000 UNITS TABS Take by mouth daily.     doxycycline (VIBRA-TABS) 100 MG tablet Take 1 tablet (100 mg total) by mouth 2 (two) times a day for 7 days. Take with 8 oz water. (Do not lie down for at least 30 minutes after) 14 tablet 0   fish oil-omega-3 fatty acids 1000 MG capsule Take by mouth daily.     metoprolol succinate (TOPROL-XL) 25 MG 24 hr tablet Take 1/2 tablet (12.5mg  total) by mouth twice a day 90 tablet 2   ramipril (ALTACE) 5 MG capsule Take 1 capsule (5 mg total) by mouth daily. 90 capsule 2   rosuvastatin (CRESTOR) 20 MG tablet Take 1 tablet (20 mg total) by mouth at bedtime. 90 tablet 2   amoxicillin (AMOXIL) 500 MG capsule SMARTSIG:4 Capsule(s) By Mouth Once (Patient not taking: Reported on 12/17/2022)     No current facility-administered medications on file  prior to visit.    Review of Systems  Constitutional:  Negative for fever.  Gastrointestinal:  Negative for abdominal pain, diarrhea and nausea.       Objective:   Vitals:   01/06/23 1501  BP: 120/68  Pulse: 72  Temp: 98.6 F (37 C)  SpO2: 98%   BP Readings from Last 3 Encounters:  01/06/23 120/68  06/17/22 116/68  05/24/22 124/72   Wt Readings from Last 3 Encounters:  01/06/23 190 lb (86.2 kg)  12/17/22 190 lb (86.2 kg)  06/17/22 195 lb (88.5 kg)   Body mass index is 25.77 kg/m.    Physical Exam Constitutional:      General: He is not in acute distress.    Appearance: Normal appearance. He is not ill-appearing.  HENT:     Mouth/Throat:     Comments: Small ulcer tip of tongue w/o surrounding erythema, no other ulcers noted Skin:    General: Skin is warm and dry.     Findings: Rash (several papules in bead area b/l - appearing to scab over.  irregularly shaped patch of erythema right upper medial arm) present.  Neurological:     Mental Status: He is alert.  Assessment & Plan:    See Problem List for Assessment and Plan of chronic medical problems.

## 2023-01-06 NOTE — Patient Instructions (Addendum)
? ? ? ?  ? ? ?  Medications changes include :  none  ? ? ? ? ? ?Return if symptoms worsen or fail to improve. ? ?

## 2023-01-06 NOTE — Assessment & Plan Note (Signed)
Acute Improving with doxycycline - rx'd 7 days - continue -- if not completely resolved at end of one week treatment he will let me know

## 2023-01-06 NOTE — Assessment & Plan Note (Signed)
Subacute Intermittent x 4-5 months Improves with prn otc cortisone Will continue above ? Cause If no improvement - will see derm

## 2023-01-06 NOTE — Assessment & Plan Note (Signed)
Acute Related to trauma Improving slowly Continue peridex 2-3 times a day

## 2023-01-07 ENCOUNTER — Other Ambulatory Visit (HOSPITAL_COMMUNITY): Payer: Self-pay

## 2023-01-11 ENCOUNTER — Encounter: Payer: Self-pay | Admitting: Internal Medicine

## 2023-01-11 ENCOUNTER — Encounter: Payer: Self-pay | Admitting: Gastroenterology

## 2023-01-13 ENCOUNTER — Other Ambulatory Visit (HOSPITAL_COMMUNITY): Payer: Self-pay

## 2023-01-13 ENCOUNTER — Telehealth: Payer: Self-pay | Admitting: *Deleted

## 2023-01-13 DIAGNOSIS — Z86010 Personal history of colonic polyps: Secondary | ICD-10-CM

## 2023-01-13 MED ORDER — NA SULFATE-K SULFATE-MG SULF 17.5-3.13-1.6 GM/177ML PO SOLN
1.0000 | Freq: Once | ORAL | 0 refills | Status: AC
  Filled 2023-01-13: qty 354, 2d supply, fill #0

## 2023-01-13 NOTE — Telephone Encounter (Signed)
See MyChart message.  Suprep RX sent to pharmacy and instructions sent

## 2023-01-14 ENCOUNTER — Other Ambulatory Visit (HOSPITAL_COMMUNITY): Payer: Self-pay

## 2023-01-21 ENCOUNTER — Telehealth: Payer: Self-pay

## 2023-01-21 ENCOUNTER — Encounter: Payer: Self-pay | Admitting: Gastroenterology

## 2023-01-21 ENCOUNTER — Ambulatory Visit (AMBULATORY_SURGERY_CENTER): Payer: PPO | Admitting: Gastroenterology

## 2023-01-21 VITALS — BP 136/74 | HR 53 | Temp 97.1°F | Resp 12 | Ht 72.0 in | Wt 190.0 lb

## 2023-01-21 DIAGNOSIS — I10 Essential (primary) hypertension: Secondary | ICD-10-CM | POA: Diagnosis not present

## 2023-01-21 DIAGNOSIS — D121 Benign neoplasm of appendix: Secondary | ICD-10-CM | POA: Diagnosis not present

## 2023-01-21 DIAGNOSIS — D123 Benign neoplasm of transverse colon: Secondary | ICD-10-CM | POA: Diagnosis not present

## 2023-01-21 DIAGNOSIS — Z09 Encounter for follow-up examination after completed treatment for conditions other than malignant neoplasm: Secondary | ICD-10-CM | POA: Diagnosis not present

## 2023-01-21 DIAGNOSIS — Z86010 Personal history of colonic polyps: Secondary | ICD-10-CM

## 2023-01-21 MED ORDER — SODIUM CHLORIDE 0.9 % IV SOLN: 500.0000 mL | Freq: Once | INTRAVENOUS | Status: DC

## 2023-01-21 NOTE — Progress Notes (Signed)
Patient awake for procedure, according to his wishes. Tolerated awake procedure well.  Report to pacu rn. Vss. Care resumed by rn.

## 2023-01-21 NOTE — Progress Notes (Signed)
[facility] History and Physical   Primary Care Physician:  Pincus Sanes, MD   Reason for Procedure:   History of colon polyps  Plan:    colonoscopy     HPI: William Lewis is a 72 y.o. male  here for colonoscopy surveillance - history of colon polyps removed per Dr. Kinnie Scales. Last exam 11/2017.   Patient denies any bowel symptoms at this time. No family history of colon cancer known. Otherwise feels well without any cardiopulmonary symptoms. He declines anesthesia today - states he has had multiple colonoscopies in the past without sedation and prefers to avoid it if possible.  I have discussed risks / benefits of anesthesia and endoscopic procedure with William Lewis and they wish to proceed with the exams as outlined today.    Past Medical History:  Diagnosis Date   ASD (atrial septal defect) 06/10/2005   repaired with PFO in [place]   Cancer [facility])    Echocardiogram abnormal 06/11/2011   EF 50-55%, normal diastolic parameters, mild mitral regur. with his post leaflet of MV calcified   History of stress test 06/10/2006   neg.for ischemia   HTN (hypertension)    MVP (mitral valve prolapse) 06/10/2005   mitral valve repair in [place]   Other and unspecified hyperlipidemia    Dr Daphene Jaeger    Past Surgical History:  Procedure Laterality Date   CARDIAC CATHETERIZATION  2007   EF 65-70%, significant mitral prolapse wth 4+ MR, normal coronary arteries    COLONOSCOPY  2005 ; 2010;2014   polyp 2010 , Dr Kinnie Scales   HERNIA REPAIR  2007   I & D EXTREMITY     thumb, for infection   MITRAL VALVE REPAIR  2007   Robotic surgery @ [facility] , [place] and ASD repair and PFO repair   PROSTATE SURGERY  1995   TURP , Dr Aldean Ast    Prior to Admission medications   Medication Sig Start Date End Date Taking? Authorizing Provider  aspirin 81 MG tablet Take 81 mg by mouth daily.   Yes [provider]  Cholecalciferol (VITAMIN D3) 2000 UNITS TABS Take by mouth  daily.   Yes [provider]  fish oil-omega-3 fatty acids 1000 MG capsule Take by mouth daily.   Yes [provider]  metoprolol succinate (TOPROL-XL) 25 MG 24 hr tablet Take 1/2 tablet (12.5mg  total) by mouth twice a day 08/12/22  Yes Monge, Petra Kuba, NP  ramipril (ALTACE) 5 MG capsule Take 1 capsule (5 mg total) by mouth daily. 08/12/22  Yes Monge, Petra Kuba, NP  rosuvastatin (CRESTOR) 20 MG tablet Take 1 tablet (20 mg total) by mouth at bedtime. 08/12/22  Yes Monge, Petra Kuba, NP  amoxicillin (AMOXIL) 500 MG capsule SMARTSIG:4 Capsule(s) By Mouth Once Patient not taking: Reported on 12/17/2022 04/30/21   [provider]  chlorhexidine (PERIDEX) 0.12 % solution Use 15 mL in the mouth or throat 2 (two) times a day. 01/04/23     doxycycline (VIBRA-TABS) 100 MG tablet Take 1 tablet (100 mg total) by mouth 2 (two) times a day for 7 days. Take with 8 oz water. (Do not lie down for at least 30 minutes after) 01/04/23       Current Outpatient Medications  Medication Sig Dispense Refill   aspirin 81 MG tablet Take 81 mg by mouth daily.     Cholecalciferol (VITAMIN D3) 2000 UNITS TABS Take by mouth daily.     fish oil-omega-3 fatty acids 1000  MG capsule Take by mouth daily.     metoprolol succinate (TOPROL-XL) 25 MG 24 hr tablet Take 1/2 tablet (12.5mg  total) by mouth twice a day 90 tablet 2   ramipril (ALTACE) 5 MG capsule Take 1 capsule (5 mg total) by mouth daily. 90 capsule 2   rosuvastatin (CRESTOR) 20 MG tablet Take 1 tablet (20 mg total) by mouth at bedtime. 90 tablet 2   amoxicillin (AMOXIL) 500 MG capsule SMARTSIG:4 Capsule(s) By Mouth Once (Patient not taking: Reported on 12/17/2022)     chlorhexidine (PERIDEX) 0.12 % solution Use 15 mL in the mouth or throat 2 (two) times a day. 473 mL 0   doxycycline (VIBRA-TABS) 100 MG tablet Take 1 tablet (100 mg total) by mouth 2 (two) times a day for 7 days. Take with 8 oz water. (Do not lie down for at least 30 minutes after) 14 tablet 0    Current Facility-Administered Medications  Medication Dose Route Frequency Provider Last Rate Last Admin   0.9 %  sodium chloride infusion  500 mL Intravenous Once Killian Ress, Willaim Rayas, MD        Allergies as of 01/21/2023 - Review Complete 01/21/2023  Allergen Reaction Noted   Poison ivy extract  01/04/2023    Family History  Problem Relation Age of Onset   Cancer Mother 51       ? intra-abdominal   Esophageal cancer Father    Hypertension Father    Cancer Father        throat ; smoker   Prostate cancer Father    AAA (abdominal aortic aneurysm) Maternal Grandmother    Stroke Paternal Grandmother        late 45s   Heart disease Neg Hx    Diabetes Neg Hx    Colon cancer Neg Hx    Colon polyps Neg Hx    Rectal cancer Neg Hx    Stomach cancer Neg Hx     Social History   Socioeconomic History   Marital status: Widowed    Spouse name: Not on file   Number of children: Not on file   Years of education: Not on file   Highest education level: Not on file  Occupational History   Not on file  Tobacco Use   Smoking status: Former    Current packs/day: 0.00    Types: Cigarettes    Quit date: 06/10/1977    Years since quitting: 45.6   Smokeless tobacco: Never   Tobacco comments:    from age [age], up to 1 ppd or less  Vaping Use   Vaping status: Never Used  Substance and Sexual Activity   Alcohol use: Yes    Alcohol/week: 14.0 standard drinks of alcohol    Types: 14 Glasses of wine per week   Drug use: No   Sexual activity: Not on file  Other Topics Concern   Not on file  Social History Narrative   Gets reg exercise   Social Determinants of Health   Financial Resource Strain: Low Risk  (06/15/2021)   Overall Financial Resource Strain (CARDIA)    Difficulty of Paying Living Expenses: Not hard at all  Food Insecurity: No Food Insecurity (06/15/2021)   Hunger Vital Sign    Worried About Running Out of Food in the Last Year: Never true    Ran Out of Food in the Last  Year: Never true  Transportation Needs: No Transportation Needs (06/15/2021)   PRAPARE - Transportation    Lack of Transportation (  Medical): No    Lack of Transportation (Non-Medical): No  Physical Activity: Sufficiently Active (06/15/2021)   Exercise Vital Sign    Days of Exercise per Week: 5 days    Minutes of Exercise per Session: 30 min  Stress: No Stress Concern Present (06/15/2021)   [employer] of Occupational Health - Occupational Stress Questionnaire    Feeling of Stress : Not at all  Social Connections: Socially Integrated (06/15/2021)   Social Connection and Isolation Panel [NHANES]    Frequency of Communication with Friends and Family: More than three times a week    Frequency of Social Gatherings with Friends and Family: Twice a week    Attends Religious Services: More than 4 times per year    Active Member of [employer] or Organizations: Yes    Attends Engineer, structural: More than 4 times per year    Marital Status: Married  Catering manager Violence: Not At Risk (06/15/2021)   Humiliation, Afraid, Rape, and Kick questionnaire    Fear of Current or Ex-Partner: No    Emotionally Abused: No    Physically Abused: No    Sexually Abused: No    Review of Systems: All other review of systems negative except as mentioned in the HPI.  Physical Exam: Vital signs BP 131/75   Pulse 60   Temp (!) 97.1 F (36.2 C) (Temporal)   Ht 6' (1.829 m)   Wt 190 lb (86.2 kg)   SpO2 97%   BMI 25.77 kg/m   General:   Alert,  Well-developed, pleasant and cooperative in NAD Lungs:  Clear throughout to auscultation.   Heart:  Regular rate and rhythm Abdomen:  Soft, nontender and nondistended.   Neuro/Psych:  Alert and cooperative. Normal mood and affect. A and O x 3  Harlin Rain, MD [facility]

## 2023-01-21 NOTE — Telephone Encounter (Signed)
CT order in epic. Secure staff message sent to radiology scheduling to contact patient to set up appt. 

## 2023-01-21 NOTE — Op Note (Signed)
[facility] Patient Name: William Lewis Procedure Date: 01/21/2023 9:35 AM MRN: [id number] Endoscopist: Viviann Spare P. Adela Lank , MD, [id number] Age: 72 Referring MD:  Date of Birth: 17-Jul-1950 Gender: Male Account #: [id number] Procedure:                Colonoscopy Indications:              High risk colon cancer surveillance: Personal                            history of colonic polyps - last exam 11/2017 - Dr.                            Kinnie Scales - history of polyps Medicines:                None Procedure:                Pre-Anesthesia Assessment:                           - Prior to the procedure, a History and Physical                            was performed, and patient medications and                            allergies were reviewed. The patient's tolerance of                            previous anesthesia was also reviewed. The risks                            and benefits of the procedure and the sedation                            options and risks were discussed with the patient.                            All questions were answered, and informed consent                            was obtained. Prior Anticoagulants: The patient has                            taken no anticoagulant or antiplatelet agents. ASA                            Grade Assessment: II - A patient with mild systemic                            disease. After reviewing the risks and benefits,                            the patient was deemed in satisfactory condition to  undergo the procedure.                           After obtaining informed consent, the colonoscope                            was passed under direct vision. Throughout the                            procedure, the patient's blood pressure, pulse, and                            oxygen saturations were monitored continuously. The                            CF [id number] #[id number] was introduced through the anus                             and advanced to the the terminal ileum, with                            identification of the appendiceal orifice and IC                            valve. The colonoscopy was performed without                            difficulty. The patient tolerated the procedure                            well. The quality of the bowel preparation was                            good. The terminal ileum, ileocecal valve,                            appendiceal orifice, and rectum were photographed. Scope In: 9:42:20 AM Scope Out: 10:03:55 AM Scope Withdrawal Time: 0 hours 15 minutes 58 seconds  Total Procedure Duration: 0 hours 21 minutes 35 seconds  Findings:                 The perianal and digital rectal examinations were                            normal.                           The terminal ileum appeared normal.                           Multiple small-mouthed diverticula were found in                            the entire colon.  The appendiceal orifice was protuberant, and                            appeared congested / inflamed, with small amount of                            mucous noted exiting the orifice. Patient confirmed                            appendix is in place, he denies any pain recently.                            Biopsies were taken with a cold forceps for                            histology.                           A 3 mm polyp was found in the transverse colon. The                            polyp was sessile. The polyp was removed with a                            cold snare. Resection and retrieval were complete.                           Internal hemorrhoids were found during retroflexion.                           The exam was otherwise without abnormality. Complications:            No immediate complications. Estimated blood loss:                            Minimal. Estimated Blood Loss:     Estimated blood loss  was minimal. Impression:               - The examined portion of the ileum was normal.                           - Diverticulosis in the entire examined colon.                           - Congested / protuberant mucosa at the appendiceal                            orifice. Biopsied.                           - One 3 mm polyp in the transverse colon, removed                            with a cold snare. Resected and retrieved.                           -  Internal hemorrhoids.                           - The examination was otherwise normal. Recommendation:           - Patient has a contact number available for                            emergencies. The signs and symptoms of potential                            delayed complications were discussed with the                            patient. Return to normal activities tomorrow.                            Written discharge instructions were provided to the                            patient.                           - Resume previous diet.                           - Continue present medications.                           - Await pathology results.                           - Recommend CT scan abdomen / pelvis to further                            evaluate the appendix, ensure no mucocele, etc. Our                            office will coordinate. Viviann Spare P. Rosangela Fehrenbach, MD 01/21/2023 10:12:17 AM This report has been signed electronically.

## 2023-01-21 NOTE — Telephone Encounter (Signed)
-----   Message from Benancio Deeds sent at 01/21/2023  1:18 PM EDT ----- Regarding: CT scan [facility] can you help order this patient a CT scan abdomen / pelvis. Diagnosis is abnormal appendix (noted on colonoscopy), rule out mucocele. Thank you!

## 2023-01-21 NOTE — Patient Instructions (Signed)
YOU HAD AN ENDOSCOPIC PROCEDURE TODAY AT THE [facility]:   Refer to the procedure report that was given to you for any specific questions about what was found during the examination.  If the procedure report does not answer your questions, please call your gastroenterologist to clarify.  If you requested that your care partner not be given the details of your procedure findings, then the procedure report has been included in a sealed envelope for you to review at your convenience later.  **handouts given on polyps, diverticulosis and hemorrhoids**  YOU SHOULD EXPECT: Some feelings of bloating in the abdomen. Passage of more gas than usual.  Walking can help get rid of the air that was put into your GI tract during the procedure and reduce the bloating. If you had a lower endoscopy (such as a colonoscopy or flexible sigmoidoscopy) you may notice spotting of blood in your stool or on the toilet paper. If you underwent a bowel prep for your procedure, you may not have a normal bowel movement for a few days.  Please Note:  You might notice some irritation and congestion in your nose or some drainage.  This is from the oxygen used during your procedure.  There is no need for concern and it should clear up in a day or so.  SYMPTOMS TO REPORT IMMEDIATELY:  Following lower endoscopy (colonoscopy or flexible sigmoidoscopy):  Excessive amounts of blood in the stool  Significant tenderness or worsening of abdominal pains  Swelling of the abdomen that is new, acute  Fever of 100F or higher   For urgent or emergent issues, a gastroenterologist can be reached at any hour by calling [phone]. Do not use MyChart messaging for urgent concerns.    DIET:  We do recommend a small meal at first, but then you may proceed to your regular diet.  Drink plenty of fluids but you should avoid alcoholic beverages for 24 hours.  ACTIVITY:  You should plan to take it easy for the rest of today and you  should NOT DRIVE or use heavy machinery until tomorrow (because of the sedation medicines used during the test).    FOLLOW UP: Our staff will call the number listed on your records the next business day following your procedure.  We will call around 7:15- 8:00 am to check on you and address any questions or concerns that you may have regarding the information given to you following your procedure. If we do not reach you, we will leave a message.     If any biopsies were taken you will be contacted by phone or by letter within the next 1-3 weeks.  Please call us at [phone] if you have not heard about the biopsies in 3 weeks.    SIGNATURES/CONFIDENTIALITY: You and/or your care partner have signed paperwork which will be entered into your electronic medical record.  These signatures attest to the fact that that the information above on your After Visit Summary has been reviewed and is understood.  Full responsibility of the confidentiality of this discharge information lies with you and/or your care-partner.

## 2023-01-21 NOTE — Progress Notes (Signed)
VS completed by DT.  Pt's states no medical or surgical changes since previsit or office visit.  

## 2023-01-21 NOTE — Progress Notes (Signed)
Called to room to assist during endoscopic procedure.  Patient ID and intended procedure confirmed with present staff. Received instructions for my participation in the procedure from the performing physician.  

## 2023-01-22 ENCOUNTER — Telehealth: Payer: Self-pay

## 2023-01-22 NOTE — Telephone Encounter (Signed)
Attempted f/u call. No answer, left VM. 

## 2023-01-24 NOTE — Telephone Encounter (Signed)
CT scan scheduled for 01/30/23 at 8:30 am

## 2023-01-30 ENCOUNTER — Ambulatory Visit (HOSPITAL_COMMUNITY)
Admission: RE | Admit: 2023-01-30 | Discharge: 2023-01-30 | Disposition: A | Discharge status: Home / Self Care | Payer: PPO | Source: Ambulatory Visit | Attending: Gastroenterology | Admitting: Gastroenterology

## 2023-01-30 DIAGNOSIS — D121 Benign neoplasm of appendix: Secondary | ICD-10-CM | POA: Insufficient documentation

## 2023-01-30 DIAGNOSIS — R935 Abnormal findings on diagnostic imaging of other abdominal regions, including retroperitoneum: Secondary | ICD-10-CM | POA: Diagnosis not present

## 2023-01-30 DIAGNOSIS — K5730 Diverticulosis of large intestine without perforation or abscess without bleeding: Secondary | ICD-10-CM | POA: Diagnosis not present

## 2023-01-30 DIAGNOSIS — K4020 Bilateral inguinal hernia, without obstruction or gangrene, not specified as recurrent: Secondary | ICD-10-CM | POA: Diagnosis not present

## 2023-01-30 MED ORDER — IOHEXOL 9 MG/ML PO SOLN
1000.0000 mL | ORAL | Status: AC
  Administered 2023-01-30: 1000 mL via ORAL

## 2023-01-30 MED ORDER — IOHEXOL 300 MG/ML  SOLN
100.0000 mL | Freq: Once | INTRAMUSCULAR | Status: AC | PRN
  Administered 2023-01-30: 100 mL via INTRAVENOUS

## 2023-01-30 MED ORDER — IOHEXOL 9 MG/ML PO SOLN
ORAL | Status: AC
  Filled 2023-01-30: qty 1000

## 2023-02-04 ENCOUNTER — Encounter: Payer: Self-pay | Admitting: Gastroenterology

## 2023-02-05 ENCOUNTER — Encounter: Payer: Self-pay | Admitting: Internal Medicine

## 2023-02-06 NOTE — Patient Instructions (Addendum)
      Blood work was ordered.   The lab is on the first floor.    Medications changes include :   none    A referral was ordered for urology and someone will call you to schedule an appointment.

## 2023-02-06 NOTE — Progress Notes (Signed)
    Subjective:    Patient ID: William Lewis, male    DOB: [birth year], 72 y.o.   MRN: [id number]      HPI William Lewis is here for  Chief Complaint  Patient presents with   Referral     H/o turp in 1995 for prostate ca  Recent CT by GI showed - Status post prostatectomy. 14 x 17 mm soft tissue lesion along the right surgical bed may reflect a seminal vesicle remnant, indeterminate. Correlate with PSA.  He has not had PSA in a few years and did not want to wait till his next physical to have that done.  He has not seen urology in about 8 years.  Medications and allergies reviewed with patient and updated if appropriate.  Current Outpatient Medications on File Prior to Visit  Medication Sig Dispense Refill   aspirin 81 MG tablet Take 81 mg by mouth daily.     Cholecalciferol (VITAMIN D3) 2000 UNITS TABS Take by mouth daily.     fish oil-omega-3 fatty acids 1000 MG capsule Take by mouth daily.     metoprolol succinate (TOPROL-XL) 25 MG 24 hr tablet Take 1/2 tablet (12.5mg  total) by mouth twice a day 90 tablet 2   ramipril (ALTACE) 5 MG capsule Take 1 capsule (5 mg total) by mouth daily. 90 capsule 2   rosuvastatin (CRESTOR) 20 MG tablet Take 1 tablet (20 mg total) by mouth at bedtime. 90 tablet 2   amoxicillin (AMOXIL) 500 MG capsule SMARTSIG:4 Capsule(s) By Mouth Once (Patient not taking: Reported on 12/17/2022)     No current facility-administered medications on file prior to visit.    Review of Systems     Objective:   Vitals:   02/07/23 1114  BP: 118/74  Pulse: 66  Temp: 97.8 F (36.6 C)  SpO2: 95%   BP Readings from Last 3 Encounters:  02/07/23 118/74  01/21/23 136/74  01/06/23 120/68   Wt Readings from Last 3 Encounters:  02/07/23 199 lb (90.3 kg)  01/21/23 190 lb (86.2 kg)  01/06/23 190 lb (86.2 kg)   Body mass index is 26.99 kg/m.    Physical Exam         Assessment & Plan:    See Problem List for Assessment and Plan of chronic medical problems.

## 2023-02-07 ENCOUNTER — Ambulatory Visit: Payer: PPO | Admitting: Internal Medicine

## 2023-02-07 ENCOUNTER — Encounter: Payer: Self-pay | Admitting: Internal Medicine

## 2023-02-07 VITALS — BP 118/74 | HR 66 | Temp 97.8°F | Ht 72.0 in | Wt 199.0 lb

## 2023-02-07 DIAGNOSIS — R9389 Abnormal findings on diagnostic imaging of other specified body structures: Secondary | ICD-10-CM

## 2023-02-07 DIAGNOSIS — Z125 Encounter for screening for malignant neoplasm of prostate: Secondary | ICD-10-CM | POA: Insufficient documentation

## 2023-02-07 LAB — PSA, MEDICARE: PSA: 0 ng/ml — ABNORMAL LOW (ref 0.10–4.00)

## 2023-02-07 NOTE — Assessment & Plan Note (Signed)
History of prostate cancer s/p prostatectomy 1995 Check PSA

## 2023-02-07 NOTE — Assessment & Plan Note (Signed)
Recent CT scan showed a soft tissue lesion along the right surgical bed where he had in his prostatectomy.  Radiology felt it may reflect a seminal vesicle remnant, but that was indeterminate Has not had a PSA in a little while-will check PSA today Discussed possible referral to urology, but we will hold off until PSA returns we both feel they would not say much different than the radiologist, but will consider referral in the future

## 2023-02-09 ENCOUNTER — Telehealth (HOSPITAL_BASED_OUTPATIENT_CLINIC_OR_DEPARTMENT_OTHER): Payer: Self-pay | Admitting: Emergency Medicine

## 2023-02-09 ENCOUNTER — Other Ambulatory Visit: Payer: Self-pay

## 2023-02-09 ENCOUNTER — Emergency Department (HOSPITAL_BASED_OUTPATIENT_CLINIC_OR_DEPARTMENT_OTHER)
Admission: EM | Admit: 2023-02-09 | Discharge: 2023-02-09 | Disposition: A | Discharge status: Home / Self Care | Payer: PPO | Source: Home / Self Care | Attending: Emergency Medicine | Admitting: Emergency Medicine

## 2023-02-09 DIAGNOSIS — Z79899 Other long term (current) drug therapy: Secondary | ICD-10-CM | POA: Insufficient documentation

## 2023-02-09 DIAGNOSIS — T782XXA Anaphylactic shock, unspecified, initial encounter: Secondary | ICD-10-CM | POA: Insufficient documentation

## 2023-02-09 DIAGNOSIS — Z87891 Personal history of nicotine dependence: Secondary | ICD-10-CM | POA: Insufficient documentation

## 2023-02-09 DIAGNOSIS — R220 Localized swelling, mass and lump, head: Secondary | ICD-10-CM

## 2023-02-09 DIAGNOSIS — Z7982 Long term (current) use of aspirin: Secondary | ICD-10-CM | POA: Insufficient documentation

## 2023-02-09 DIAGNOSIS — I10 Essential (primary) hypertension: Secondary | ICD-10-CM | POA: Diagnosis not present

## 2023-02-09 DIAGNOSIS — T7840XA Allergy, unspecified, initial encounter: Secondary | ICD-10-CM | POA: Diagnosis present

## 2023-02-09 DIAGNOSIS — R9431 Abnormal electrocardiogram [ECG] [EKG]: Secondary | ICD-10-CM | POA: Diagnosis not present

## 2023-02-09 LAB — CBC WITH DIFFERENTIAL/PLATELET
Abs Immature Granulocytes: 0.02 10*3/uL (ref 0.00–0.07)
Basophils Absolute: 0 10*3/uL (ref 0.0–0.1)
Basophils Relative: 0 %
Eosinophils Absolute: 0.1 10*3/uL (ref 0.0–0.5)
Eosinophils Relative: 1 %
HCT: 45.9 % (ref 39.0–52.0)
Hemoglobin: 14.9 g/dL (ref 13.0–17.0)
Immature Granulocytes: 0 %
Lymphocytes Relative: 33 %
Lymphs Abs: 1.9 10*3/uL (ref 0.7–4.0)
MCH: 28.7 pg (ref 26.0–34.0)
MCHC: 32.5 g/dL (ref 30.0–36.0)
MCV: 88.3 fL (ref 80.0–100.0)
Monocytes Absolute: 0.5 10*3/uL (ref 0.1–1.0)
Monocytes Relative: 10 %
Neutro Abs: 3.1 10*3/uL (ref 1.7–7.7)
Neutrophils Relative %: 56 %
Platelets: 237 10*3/uL (ref 150–400)
RBC: 5.2 MIL/uL (ref 4.22–5.81)
RDW: 14.6 % (ref 11.5–15.5)
WBC: 5.6 10*3/uL (ref 4.0–10.5)
nRBC: 0 % (ref 0.0–0.2)

## 2023-02-09 LAB — BASIC METABOLIC PANEL
Anion gap: 13 (ref 5–15)
BUN: 19 mg/dL (ref 8–23)
CO2: 23 mmol/L (ref 22–32)
Calcium: 8.8 mg/dL — ABNORMAL LOW (ref 8.9–10.3)
Chloride: 102 mmol/L (ref 98–111)
Creatinine, Ser: 1 mg/dL (ref 0.61–1.24)
GFR, Estimated: >60 mL/min (ref >60)
Glucose, Bld: 108 mg/dL — ABNORMAL HIGH (ref 70–99)
Potassium: 4 mmol/L (ref 3.5–5.1)
Sodium: 138 mmol/L (ref 135–145)

## 2023-02-09 MED ORDER — SODIUM CHLORIDE 0.9 % IV BOLUS
1000.0000 mL | Freq: Once | INTRAVENOUS | Status: AC
  Administered 2023-02-09: 1000 mL via INTRAVENOUS

## 2023-02-09 MED ORDER — EPINEPHRINE PF 1 MG/ML IJ SOLN
INTRAMUSCULAR | Status: AC
  Filled 2023-02-09: qty 1

## 2023-02-09 MED ORDER — EPINEPHRINE 0.3 MG/0.3ML IJ SOAJ
INTRAMUSCULAR | Status: AC
  Administered 2023-02-09: 0.3 mg
  Filled 2023-02-09: qty 0.3

## 2023-02-09 MED ORDER — CETIRIZINE HCL 10 MG PO TABS: 10.0000 mg | ORAL_TABLET | Freq: Every day | ORAL | 0 refills | Status: DC

## 2023-02-09 MED ORDER — GLUCAGON HCL RDNA (DIAGNOSTIC) 1 MG IJ SOLR
1.0000 mg | Freq: Once | INTRAMUSCULAR | Status: AC
  Administered 2023-02-09: 1 mg via INTRAVENOUS
  Filled 2023-02-09: qty 1

## 2023-02-09 MED ORDER — EPINEPHRINE 0.3 MG/0.3ML IJ SOAJ: 0.3000 mg | INTRAMUSCULAR | 0 refills | Status: AC | PRN

## 2023-02-09 MED ORDER — ONDANSETRON HCL 4 MG/2ML IJ SOLN
4.0000 mg | Freq: Once | INTRAMUSCULAR | Status: AC
  Administered 2023-02-09: 4 mg via INTRAVENOUS
  Filled 2023-02-09: qty 2

## 2023-02-09 MED ORDER — PREDNISONE 10 MG (21) PO TBPK: ORAL_TABLET | Freq: Every day | ORAL | 0 refills | Status: DC

## 2023-02-09 MED ORDER — PREDNISONE 10 MG (21) PO TBPK
ORAL_TABLET | Freq: Every day | ORAL | 0 refills | Status: DC
  Filled 2023-02-09: qty 42, fill #0

## 2023-02-09 MED ORDER — CETIRIZINE HCL 10 MG PO TABS
10.0000 mg | ORAL_TABLET | Freq: Every day | ORAL | 0 refills | Status: DC
Start: 2023-02-09 — End: 2023-04-01

## 2023-02-09 MED ORDER — METHYLPREDNISOLONE SODIUM SUCC 125 MG IJ SOLR
125.0000 mg | Freq: Once | INTRAMUSCULAR | Status: AC
  Administered 2023-02-09: 125 mg via INTRAVENOUS
  Filled 2023-02-09: qty 2

## 2023-02-09 MED ORDER — TRANEXAMIC ACID-NACL 1000-0.7 MG/100ML-% IV SOLN
1000.0000 mg | Freq: Once | INTRAVENOUS | Status: AC
  Administered 2023-02-09: 1000 mg via INTRAVENOUS
  Filled 2023-02-09: qty 100

## 2023-02-09 MED ORDER — EPINEPHRINE PF 1 MG/ML IJ SOLN
0.3000 mg | Freq: Once | INTRAMUSCULAR | Status: DC
  Filled 2023-02-09: qty 1

## 2023-02-09 MED ORDER — FAMOTIDINE IN NACL 20-0.9 MG/50ML-% IV SOLN
20.0000 mg | Freq: Once | INTRAVENOUS | Status: AC
  Administered 2023-02-09: 20 mg via INTRAVENOUS
  Filled 2023-02-09: qty 50

## 2023-02-09 MED ORDER — EPINEPHRINE 0.3 MG/0.3ML IJ SOAJ: 0.3000 mg | INTRAMUSCULAR | 0 refills | Status: DC | PRN

## 2023-02-09 NOTE — ED Provider Notes (Signed)
Patient signed out to me at 3 PM.  Here with allergic reaction versus reaction to Altace.  He has had great improvement following epinephrine.  He was given 2 doses of epinephrine a few minutes apart due to some significant swelling to his lips and tongue.  He was given TXA, antihistamines and steroids.  Overall it has been several hours and on my initial evaluation swelling had greatly improved especially after talking with Dr. Wallace Cullens we both evaluated the patient at 3 PM.  I have reevaluated him now 5 PM and swelling is almost completely gone.  He is got a little bit of residual swelling on the neck where we suspect may be some sort of bee sting.  I have already instructed him to stop Altace just in case this was a reaction to that but he did have hives on his arms and I do suspect that this was anaphylaxis from allergic reaction from a bug bite.  Lab works unremarkable.  I will observe him for a little bit longer.  He has had near resolution of his symptoms.  I have educated him about EpiPen's and will prescribe EpiPen and steroids and refer him to allergy.  He will stop his Altace and follow-up with his primary care doctor to see if he needs to take any medicine and replace of that.  He feels comfortable with this plan.  We did talk about maybe an observation stay but given that he has had such great improvement of symptoms anticipate discharge to home.  Patient reevaluated at 7:30 PM.  Symptoms for the most part are fully resolved.  He still has a little bit of swelling at the neck area where presumed bug bite was.  Ultimately there is no more swelling of his tongue or lips.  Previous provider has ready prescribe Zyrtec, prednisone, EpiPen's.  He is educated about when to use an epinephrine.  He was told to stop Altace and follow-up with his primary care doctor.  He understands return precautions.  Discharged in good condition.  This chart was dictated using voice recognition software.  Despite best efforts  to proofread,  errors can occur which can change the documentation meaning.    Virgina Norfolk, DO 02/09/23 1928

## 2023-02-09 NOTE — ED Provider Notes (Signed)
[place] EMERGENCY DEPARTMENT AT [facility] Provider Note  CSN: [id number] Arrival date & time: 02/09/23 1319  Chief Complaint(s) Allergic Reaction  HPI MAHIR ALIBERTI is a 72 y.o. male with past medical history as below, significant for ASD, MVP, HTN who presents to the ED with complaint of anaphylaxis.   Patient with sudden onset throat and tongue swelling.  He is unsure provoking factor.  He is allergy to poison ivy but no recent exposure.  Possible insect bite to his throat earlier today.  Took Benadryl 50 mg prior to arrival without much relief of symptoms.  On arrival of her angioedema, significant dysphonia, drooling.  Unable to contribute much to history secondary to severe tongue swelling  Accompanied by spouse who contributes to history  Past Medical History Past Medical History:  Diagnosis Date   ASD (atrial septal defect) 06/10/2005   repaired with PFO in [facility]   Cancer [facility])    Echocardiogram abnormal 06/11/2011   EF 50-55%, normal diastolic parameters, mild mitral regur. with his post leaflet of MV calcified   History of stress test 06/10/2006   neg.for ischemia   HTN (hypertension)    MVP (mitral valve prolapse) 06/10/2005   mitral valve repair in [place]   Other and unspecified hyperlipidemia    Dr Daphene Jaeger   Patient Active Problem List   Diagnosis Date Noted   Encounter for prostate cancer screening 02/07/2023   Abnormal CT scan 02/07/2023   Strain of hamstring muscle, subsequent encounter 06/01/2020   Prediabetes 05/29/2020   Subclinical hypothyroidism 05/29/2020   Internal hemorrhoids 04/01/2018   Schatzki's ring 10/17/2017   S/P MVR (mitral valve repair),2007 along with ASD repair 04/26/2013   History of adenomatous polyp of colon 04/14/2013   Vitamin D deficiency 03/30/2012   Hypertension 06/29/2010   Hyperlipidemia 10/21/2007   MITRAL REGURGITATION 10/21/2007   HX, PERSONAL, MALIGNANCY, PROSTATE 10/23/2006   Home Medication(s) Prior  to Admission medications   Medication Sig Start Date End Date Taking? Authorizing Provider  cetirizine (ZYRTEC ALLERGY) 10 MG tablet Take 1 tablet (10 mg total) by mouth daily for 14 days. 02/09/23 02/23/23 Yes Tanda Rockers A, DO  EPINEPHrine 0.3 mg/0.3 mL IJ SOAJ injection Inject 0.3 mg into the muscle as needed for anaphylaxis. 02/09/23  Yes Tanda Rockers A, DO  predniSONE (STERAPRED UNI-PAK 21 TAB) 10 MG (21) TBPK tablet Take by mouth daily. Take 6 tabs by mouth daily  for 2 days, then 5 tabs for 2 days, then 4 tabs for 2 days, then 3 tabs for 2 days, 2 tabs for 2 days, then 1 tab by mouth daily for 2 days 02/10/23  Yes Sloan Leiter, DO  amoxicillin (AMOXIL) 500 MG capsule SMARTSIG:4 Capsule(s) By Mouth Once Patient not taking: Reported on 12/17/2022 04/30/21   [provider]  aspirin 81 MG tablet Take 81 mg by mouth daily.    [provider]  Cholecalciferol (VITAMIN D3) 2000 UNITS TABS Take by mouth daily.    [provider]  fish oil-omega-3 fatty acids 1000 MG capsule Take by mouth daily.    [provider]  metoprolol succinate (TOPROL-XL) 25 MG 24 hr tablet Take 1/2 tablet (12.5mg  total) by mouth twice a day 08/12/22   Joylene Grapes, NP  ramipril (ALTACE) 5 MG capsule Take 1 capsule (5 mg total) by mouth daily. 08/12/22   Joylene Grapes, NP  rosuvastatin (CRESTOR) 20 MG tablet Take 1 tablet (20 mg total) by mouth at bedtime. 08/12/22  Joylene Grapes, NP                                                                                                                                    Past Surgical History Past Surgical History:  Procedure Laterality Date   CARDIAC CATHETERIZATION  2007   EF 65-70%, significant mitral prolapse wth 4+ MR, normal coronary arteries    COLONOSCOPY  2005 ; 2010;2014   polyp 2010 , Dr Kinnie Scales   HERNIA REPAIR  2007   I & D EXTREMITY     thumb, for infection   MITRAL VALVE REPAIR  2007   Robotic surgery @ [facility] , [place] and ASD  repair and PFO repair   PROSTATE SURGERY  1995   TURP , Dr Aldean Ast   Family History Family History  Problem Relation Age of Onset   Cancer Mother 52       ? intra-abdominal   Esophageal cancer Father    Hypertension Father    Cancer Father        throat ; smoker   Prostate cancer Father    AAA (abdominal aortic aneurysm) Maternal Grandmother    Stroke Paternal Grandmother        late 62s   Heart disease Neg Hx    Diabetes Neg Hx    Colon cancer Neg Hx    Colon polyps Neg Hx    Rectal cancer Neg Hx    Stomach cancer Neg Hx     Social History Social History   Tobacco Use   Smoking status: Former    Current packs/day: 0.00    Types: Cigarettes    Quit date: 06/10/1977    Years since quitting: 45.6   Smokeless tobacco: Never   Tobacco comments:    from age [age], up to 1 ppd or less  Vaping Use   Vaping status: Never Used  Substance Use Topics   Alcohol use: Yes    Alcohol/week: 14.0 standard drinks of alcohol    Types: 14 Glasses of wine per week   Drug use: No   Allergies Poison ivy extract  Review of Systems Review of Systems  Unable to perform ROS: Acuity of condition  HENT:  Positive for facial swelling, trouble swallowing and voice change.   Respiratory:  Positive for shortness of breath.   Skin:  Positive for rash.    Physical Exam Vital Signs  I have reviewed the triage vital signs BP (!) 135/91   Pulse 60   Temp (!) 97.2 F (36.2 C) (Temporal)   Resp 11   Ht 6' (1.829 m)   Wt 90.3 kg   SpO2 100%   BMI 26.99 kg/m  Physical Exam Vitals and nursing note reviewed.  Constitutional:      General: He is in acute distress.     Appearance: He is well-developed. He is ill-appearing.  HENT:     Head: Normocephalic  and atraumatic. No right periorbital erythema or left periorbital erythema.      Right Ear: External ear normal.     Left Ear: External ear normal.     Mouth/Throat:     Mouth: Mucous membranes are moist.     Comments: Angioedema   No stridor He is drooling, difficulty tolerating secretions Eyes:     General: No scleral icterus. Cardiovascular:     Rate and Rhythm: Normal rate and regular rhythm.     Pulses: Normal pulses.     Heart sounds: Normal heart sounds.  Pulmonary:     Effort: Pulmonary effort is normal. No respiratory distress.     Breath sounds: Normal breath sounds.  Abdominal:     General: Abdomen is flat.     Palpations: Abdomen is soft.     Tenderness: There is no abdominal tenderness.  Musculoskeletal:     Cervical back: No rigidity.     Right lower leg: No edema.     Left lower leg: No edema.  Skin:    General: Skin is warm and dry.     Capillary Refill: Capillary refill takes less than 2 seconds.     Comments: Urticaria noted to forearms  Neurological:     Mental Status: He is alert.  Psychiatric:        Mood and Affect: Mood normal.        Behavior: Behavior normal.     ED Results and Treatments Labs (all labs ordered are listed, but only abnormal results are displayed) Labs Reviewed  BASIC METABOLIC PANEL - Abnormal; Notable for the following components:      Result Value   Glucose, Bld 108 (*)    Calcium 8.8 (*)    All other components within normal limits  CBC WITH DIFFERENTIAL/PLATELET                                                                                                                          Radiology No results found.  Pertinent labs & imaging results that were available during my care of the patient were reviewed by me and considered in my medical decision making (see MDM for details).  Medications Ordered in ED Medications  EPINEPHrine (ADRENALIN) 0.3 mg (0.3 mg Intramuscular Not Given 02/09/23 1342)  EPINEPHrine (ADRENALIN) 1 MG/ML (  Not Given 02/09/23 1412)  EPINEPHrine (EPI-PEN) 0.3 mg/0.3 mL injection (0.3 mg  Given 02/09/23 1331)  methylPREDNISolone sodium succinate (SOLU-MEDROL) 125 mg/2 mL injection 125 mg (125 mg Intravenous Given 02/09/23 1344)   famotidine (PEPCID) IVPB 20 mg premix (0 mg Intravenous Stopped 02/09/23 1401)  ondansetron (ZOFRAN) injection 4 mg (4 mg Intravenous Given 02/09/23 1353)  EPINEPHrine (EPI-PEN) 0.3 mg/0.3 mL injection (0.3 mg  Given 02/09/23 1341)  glucagon (human recombinant) (GLUCAGEN) injection 1 mg (1 mg Intravenous Given 02/09/23 1351)  tranexamic acid (CYKLOKAPRON) IVPB 1,000 mg (1,000 mg Intravenous New Bag/Given 02/09/23 1355)  sodium chloride 0.9 % bolus 1,000 mL (0 mLs  Intravenous Stopped 02/09/23 1416)  sodium chloride 0.9 % bolus 1,000 mL (1,000 mLs Intravenous New Bag/Given 02/09/23 1442)                                                                                                                                     Procedures .Critical Care  Performed by: Sloan Leiter, DO Authorized by: Sloan Leiter, DO   Critical care provider statement:    Critical care time (minutes):  45   Critical care time was exclusive of:  Separately billable procedures and treating other patients   Critical care was necessary to treat or prevent imminent or life-threatening deterioration of the following conditions: anaphylaxis.   Critical care was time spent personally by me on the following activities:  Development of treatment plan with patient or surrogate, discussions with consultants, evaluation of patient's response to treatment, examination of patient, ordering and review of laboratory studies, ordering and review of radiographic studies, ordering and performing treatments and interventions, pulse oximetry, re-evaluation of patient's condition, review of old charts and obtaining history from patient or surrogate   (including critical care time)  Medical Decision Making / ED Course    Medical Decision Making:    KAIN MOREAUX is a 72 y.o. male with past medical history as below, significant for ASD, MVP, HTN who presents to the ED with complaint of anaphylaxis. . The complaint involves an extensive differential  diagnosis and also carries with it a high risk of complications and morbidity.  Serious etiology was considered. Ddx includes but is not limited to: Anaphylaxis, angioedema, Ludwig's angina, localized irritation, etc.  Complete initial physical exam performed, notably the patient  was distress, angioedema noted, he is drooling, no stridor..    Reviewed and confirmed nursing documentation for past medical history, family history, social history.  Vital signs reviewed.    Clinical Course as of 02/09/23 1543  Sun Feb 09, 2023  1340 No sig improvement with epi x1, give solumedrol, pepcid, IVF as well. He took benadryl 50mg  PO at home PTA. He has sig tongue/throat swelling externally. He is able to speak but has sig dysphagia. Will repeat epi.  [SG]  1415 Patient reports having some improvement in his throat discomfort and breathing following glucagon and TXA.  He is able to swallow.  Tongue swelling appears to be reducing as well. [SG]  1445 Continues to have improvement in tongue and neck swelling.  Tolerating secretions.  He is able to drink a sip of water without much difficulty [SG]  1518 Continued marked movement of facial swelling [SG]    Clinical Course User Index [SG] Sloan Leiter, DO     Patient here with anaphylaxis versus angioedema.  Symptoms have greatly improved.  Recommend ongoing observation, patient signed out to incoming EDP pending further observation. Would be reasonable to observe patient overnight if symptoms persist.  Additional history obtained: -Additional history obtained from spouse -External records from outside source obtained and reviewed including: Chart review including previous notes, labs, imaging, consultation notes including  Allergy list, home medications, prior labs and imaging   Lab Tests: -I ordered, reviewed, and interpreted labs.   The pertinent results include:   Labs Reviewed  BASIC METABOLIC PANEL - Abnormal; Notable  for the following components:      Result Value   Glucose, Bld 108 (*)    Calcium 8.8 (*)    All other components within normal limits  CBC WITH DIFFERENTIAL/PLATELET    Notable for labs stable  EKG   EKG Interpretation Date/Time:  Sunday February 09 2023 13:34:02 EDT Ventricular Rate:  89 PR Interval:  146 QRS Duration:  86 QT Interval:  410 QTC Calculation: 436 R Axis:   -42  Text Interpretation: Ectopic atrial rhythm Supraventricular bigeminy Confirmed by Virgina Norfolk [phone]) on 02/09/2023 3:08:11 PM         Imaging Studies ordered: na   Medicines ordered and prescription drug management: Meds ordered this encounter  Medications   EPINEPHrine (EPI-PEN) 0.3 mg/0.3 mL injection    Katrinka Blazing, Tim M: cabinet override   methylPREDNISolone sodium succinate (SOLU-MEDROL) 125 mg/2 mL injection 125 mg    IV methylprednisolone will be converted to either a q12h or q24h frequency with the same total daily dose (TDD).  Ordered Dose: 1 to 125 mg TDD; convert to: TDD q24h.  Ordered Dose: 126 to 250 mg TDD; convert to: TDD div q12h.  Ordered Dose: >250 mg TDD; DAW.   famotidine (PEPCID) IVPB 20 mg premix   ondansetron (ZOFRAN) injection 4 mg   EPINEPHrine (ADRENALIN) 0.3 mg   EPINEPHrine (EPI-PEN) 0.3 mg/0.3 mL injection    Katrinka Blazing, Tim M: cabinet override   glucagon (human recombinant) (GLUCAGEN) injection 1 mg   tranexamic acid (CYKLOKAPRON) IVPB 1,000 mg   EPINEPHrine (ADRENALIN) 1 MG/ML    Free, Courtlanda N: cabinet override   sodium chloride 0.9 % bolus 1,000 mL   sodium chloride 0.9 % bolus 1,000 mL   EPINEPHrine 0.3 mg/0.3 mL IJ SOAJ injection    Sig: Inject 0.3 mg into the muscle as needed for anaphylaxis.    Dispense:  1 each    Refill:  0   cetirizine (ZYRTEC ALLERGY) 10 MG tablet    Sig: Take 1 tablet (10 mg total) by mouth daily for 14 days.    Dispense:  14 tablet    Refill:  0   predniSONE (STERAPRED UNI-PAK 21 TAB) 10 MG (21) TBPK tablet    Sig: Take by  mouth daily. Take 6 tabs by mouth daily  for 2 days, then 5 tabs for 2 days, then 4 tabs for 2 days, then 3 tabs for 2 days, 2 tabs for 2 days, then 1 tab by mouth daily for 2 days    Dispense:  42 tablet    Refill:  0    -I have reviewed the patients home medicines and have made adjustments as needed   Consultations Obtained: na   Cardiac Monitoring: The patient was maintained on a cardiac monitor.  I personally viewed and interpreted the cardiac monitored which showed an underlying rhythm of: NSR, occ pvc's  Social Determinants of Health:  Diagnosis or treatment significantly limited by social determinants of health: former smoker   Reevaluation: After the interventions noted above, I reevaluated the patient and found that they have improved  Co morbidities that complicate the patient evaluation  Past Medical History:  Diagnosis Date   ASD (atrial septal defect) 06/10/2005   repaired with PFO in [place]   Cancer [facility])    Echocardiogram abnormal 06/11/2011   EF 50-55%, normal diastolic parameters, mild mitral regur. with his post leaflet of MV calcified   History of stress test 06/10/2006   neg.for ischemia   HTN (hypertension)    MVP (mitral valve prolapse) 06/10/2005   mitral valve repair in [place]   Other and unspecified hyperlipidemia    Dr Daphene Jaeger      Dispostion: Disposition decision including need for hospitalization was considered, and patient disposition pending at time of sign out.    Final Clinical Impression(s) / ED Diagnoses Final diagnoses:  Anaphylaxis, initial encounter  Tongue swelling        Sloan Leiter, DO 02/09/23 1543

## 2023-02-09 NOTE — ED Notes (Signed)
He tells me he is feeling "a whole lot better". His speech is now normal. His tongue also is now normal. He has a remnant of edema surrounding a bite/sting below his chin. He continues to breath normally with clear breath sounds.

## 2023-02-09 NOTE — ED Triage Notes (Addendum)
Pt to ED c/o allergic reaction aprox 20 mins ago, believe got stung by an insect , bite noted to neck, wife reports took two benadryl prior to arrival . Throat swelling, tongue swelling noted, difficulty speaking, Dr Juanita Craver to bedside.

## 2023-02-09 NOTE — Telephone Encounter (Signed)
Steroid sent to new phamracy

## 2023-02-09 NOTE — Discharge Instructions (Addendum)
Please follow-up with your primary doctor(s) within 2-3 days. Please avoid any known triggers of your allergies.  We have given you a prescription for an Epi-Pen. Please pick it up as soon as possible. Always carry this with you. Only use the Epi-Pen in the event of a severe allergic reaction with trouble breathing or throat swelling. You must go to the hospital right away if you ever use the Epi-Pen. Remember that they expire every year so you should have your doctor write a new prescription yearly. We have also given you a prescription for prednisone and an antihistamine. Please pick it up as soon as possible and use as directed.     It was a pleasure caring for you today in the emergency department.  Please return to the emergency department for any worsening or worrisome symptoms.   Discontinue Altace as discussed.  Follow-up with your primary care doctor about may be replacing this medicine.

## 2023-02-09 NOTE — ED Notes (Signed)
All of my efforts thus far have been to giving meds and observing pt. Upon arrival he had massive tongue edema and much facial edema, but never stridorous, nor was his airway ever compromised. As I write this, he tells me he is feeling "much better; and I can swallow now". He has visibly less tongue and facial edema also. He was formerly flushed and now his skin color is normal.

## 2023-02-10 ENCOUNTER — Encounter: Payer: Self-pay | Admitting: Internal Medicine

## 2023-02-10 ENCOUNTER — Other Ambulatory Visit (HOSPITAL_COMMUNITY): Payer: Self-pay

## 2023-02-11 ENCOUNTER — Ambulatory Visit (INDEPENDENT_AMBULATORY_CARE_PROVIDER_SITE_OTHER): Payer: PPO | Admitting: Allergy & Immunology

## 2023-02-11 ENCOUNTER — Other Ambulatory Visit: Payer: Self-pay

## 2023-02-11 ENCOUNTER — Encounter: Payer: Self-pay | Admitting: Allergy & Immunology

## 2023-02-11 ENCOUNTER — Encounter: Payer: Self-pay | Admitting: Internal Medicine

## 2023-02-11 VITALS — BP 132/68 | HR 65 | Temp 98.1°F | Ht 72.0 in | Wt 201.0 lb

## 2023-02-11 DIAGNOSIS — R220 Localized swelling, mass and lump, head: Secondary | ICD-10-CM

## 2023-02-11 DIAGNOSIS — C61 Malignant neoplasm of prostate: Secondary | ICD-10-CM

## 2023-02-11 DIAGNOSIS — I340 Nonrheumatic mitral (valve) insufficiency: Secondary | ICD-10-CM | POA: Diagnosis not present

## 2023-02-11 DIAGNOSIS — R221 Localized swelling, mass and lump, neck: Secondary | ICD-10-CM | POA: Diagnosis not present

## 2023-02-11 NOTE — Progress Notes (Signed)
NEW PATIENT  Date of Service/Encounter:  02/11/23  Consult requested by: Pincus Sanes, MD   Assessment:   Swelling of lip, tongue, and throat - concern for a stinging insect allergy (getting labs today)  Active biker, tennis player, and golfer as well as swimmer  History of prostate cancer status post resection as well as mitral valve regurgitation status post improvement  Plan/Recommendations:   1. Swelling of lip, tongue, and throat - We are going to get some lab work to check for allergies to stinging insects. - We are also looking for elevated mast cell activity with a serum tryptase. - We will also look for alpha gal syndrome. - EpiPen training reviewed. - Emergency Anaphylaxis Plan provided.  2. Return in about 1 year (around 02/11/2024). You can have the follow up appointment with Dr. Dellis Anes or a Nurse Practicioner (our Nurse Practitioners are excellent and always have Physician oversight!).    This note in its entirety was forwarded to the Provider who requested this consultation.  Subjective:   William Lewis is a 72 y.o. male presenting today for evaluation of  Chief Complaint  Patient presents with   Allergic Reaction    Pt states that he had a delayed reaction to a bee sting.    William Lewis has a history of the following: Patient Active Problem List   Diagnosis Date Noted   Encounter for prostate cancer screening 02/07/2023   Abnormal CT scan 02/07/2023   Strain of hamstring muscle, subsequent encounter 06/01/2020   Prediabetes 05/29/2020   Subclinical hypothyroidism 05/29/2020   Internal hemorrhoids 04/01/2018   Schatzki's ring 10/17/2017   S/P MVR (mitral valve repair),2007 along with ASD repair 04/26/2013   History of adenomatous polyp of colon 04/14/2013   Vitamin D deficiency 03/30/2012   Hypertension 06/29/2010   Hyperlipidemia 10/21/2007   MITRAL REGURGITATION 10/21/2007   HX, PERSONAL, MALIGNANCY, PROSTATE 10/23/2006    History  obtained from: chart review and patient.  William Lewis was referred by Pincus Sanes, MD.     William Lewis is a 72 y.o. male presenting for an evaluation of a stinging insect allergy .  He has a pool and goes swimming every morning for 30 minutes per day. He is a Company secretary. He also plays tennis and golf. He goes hunting and shooting and whatnot. When he was cleaning the pool on Saturday after a swim, this was a little later in the afternoon. He was cleaning and felt some stinging on his throat. This was the front part of his throat. He swished it away but it did sting him. He did have some itching and some localized swelling. Then Sunday morning he went swimming. HE got out and took a shower. When he was in the shower, he felt his throat swelling. This was around 24 hours later or so. Swelling went from this throat to his tongue. He was having some trouble swallowing. He went to the ED on September 1st. He literally could not talk or swallow. They took him in immediately and he got two EpiPen shots. He got one and then they gave him a second one as well. They then placed him on an IV and he was there for 7.5 hours.   He could talk normally after around 3-4 hours. They did have the equipment outside his room in case they needed to intubate him.  He has been stung a number of times. The only other time that he had a severe reaction  was when he had a poison ivy infection. He tells me that he had this on his hands and he then touched his groin area. He got a cortisone shot, but this was 15 years ago. He has had small outbreaks of poison ivy since that time.  He is retired. He is flying to [place] to go riding down the [facility] This is going to be a one week trip. He is doing segments.   He had prostate cancer in 1995 and this was cured with removal. PSA has been stable since then. He has mitral regurgitation and had a mitral valve repair in 2007. This was done in [place]. He actually did not have a valve  replacement - this was changes to the valve itsef.  He did not need open heart surgery.  Otherwise, there is no history of other atopic diseases, including drug allergies, stinging insect allergies, or contact dermatitis. There is no significant infectious history. Vaccinations are up to date.    Past Medical History: Patient Active Problem List   Diagnosis Date Noted   Encounter for prostate cancer screening 02/07/2023   Abnormal CT scan 02/07/2023   Strain of hamstring muscle, subsequent encounter 06/01/2020   Prediabetes 05/29/2020   Subclinical hypothyroidism 05/29/2020   Internal hemorrhoids 04/01/2018   Schatzki's ring 10/17/2017   S/P MVR (mitral valve repair),2007 along with ASD repair 04/26/2013   History of adenomatous polyp of colon 04/14/2013   Vitamin D deficiency 03/30/2012   Hypertension 06/29/2010   Hyperlipidemia 10/21/2007   MITRAL REGURGITATION 10/21/2007   HX, PERSONAL, MALIGNANCY, PROSTATE 10/23/2006    Medication List:  Allergies as of 02/11/2023       Reactions   Poison Ivy Extract         Medication List        Accurate as of February 11, 2023  3:24 PM. If you have any questions, ask your nurse or doctor.          amoxicillin 500 MG capsule Commonly known as: AMOXIL   aspirin 81 MG tablet Take 81 mg by mouth daily.   cetirizine 10 MG tablet Commonly known as: ZyrTEC Allergy Take 1 tablet (10 mg total) by mouth daily for 14 days.   EPINEPHrine 0.3 mg/0.3 mL Soaj injection Commonly known as: EPI-PEN Inject 0.3 mg into the muscle as needed for anaphylaxis.   fish oil-omega-3 fatty acids 1000 MG capsule Take by mouth daily.   metoprolol succinate 25 MG 24 hr tablet Commonly known as: TOPROL-XL Take 1/2 tablet (12.5mg  total) by mouth twice a day   predniSONE 10 MG (21) Tbpk tablet Commonly known as: STERAPRED UNI-PAK 21 TAB Take by mouth daily. Take 6 tabs by mouth daily  for 2 days, then 5 tabs for 2 days, then 4 tabs for 2 days,  then 3 tabs for 2 days, 2 tabs for 2 days, then 1 tab by mouth daily for 2 days   rosuvastatin 20 MG tablet Commonly known as: CRESTOR Take 1 tablet (20 mg total) by mouth at bedtime.   Vitamin D3 50 MCG (2000 UT) Tabs Take by mouth daily.        Birth History: non-contributory  Developmental History: non-contributory  Past Surgical History: Past Surgical History:  Procedure Laterality Date   CARDIAC CATHETERIZATION  2007   EF 65-70%, significant mitral prolapse wth 4+ MR, normal coronary arteries    COLONOSCOPY  2005 ; 2010;2014   polyp 2010 , Dr Kinnie Scales   HERNIA REPAIR  2007  I & D EXTREMITY     thumb, for infection   MITRAL VALVE REPAIR  2007   Robotic surgery @ [address] , [place] and ASD repair and PFO repair   PROSTATE SURGERY  1995   TURP , Dr Aldean Ast     Family History: Family History  Problem Relation Age of Onset   Cancer Mother 39       ? intra-abdominal   Esophageal cancer Father    Hypertension Father    Cancer Father        throat ; smoker   Prostate cancer Father    AAA (abdominal aortic aneurysm) Maternal Grandmother    Stroke Paternal Grandmother        late 85s   Heart disease Neg Hx    Diabetes Neg Hx    Colon cancer Neg Hx    Colon polyps Neg Hx    Rectal cancer Neg Hx    Stomach cancer Neg Hx      Social History: Deaveon lives at home with his girlfriend.  He lives in a house that is 3 years old.  There is wood with carpeting throughout the home.  He is gassing and central cooling.  There is a dog inside of the home.  There are no dust mite covers on the bedding.  There is no tobacco exposure.  He is currently retired.  There is no fume, chemical, or dust exposure.  There is a HEPA filter in the home.  They do not live near an interstate or industrial area.  He has 2 children.  1 is a son who lives locally and is a Photographer.  He monitors wells and methane admissions.  His other child is a daughter he lives in [place] and works as a  Clinical research associate, specifically with healthcare systems.   Review of systems otherwise negative other than that mentioned in the HPI.    Objective:   Blood pressure 132/68, pulse 65, temperature 98.1 F (36.7 C), height 6' (1.829 m), weight 201 lb (91.2 kg), SpO2 95%. Body mass index is 27.26 kg/m.     Physical Exam Vitals reviewed.  Constitutional:      Appearance: He is well-developed.     Comments: Pleasant.  HENT:     Head: Normocephalic and atraumatic.     Right Ear: Tympanic membrane, ear canal and external ear normal. No drainage, swelling or tenderness. Tympanic membrane is not injected, scarred, erythematous, retracted or bulging.     Left Ear: Tympanic membrane, ear canal and external ear normal. No drainage, swelling or tenderness. Tympanic membrane is not injected, scarred, erythematous, retracted or bulging.     Nose: No nasal deformity, septal deviation, mucosal edema or rhinorrhea.     Right Sinus: No maxillary sinus tenderness or frontal sinus tenderness.     Left Sinus: No maxillary sinus tenderness or frontal sinus tenderness.     Mouth/Throat:     Mouth: Mucous membranes are not pale and not dry.     Pharynx: Uvula midline.  Eyes:     General:        Right eye: No discharge.        Left eye: No discharge.     Conjunctiva/sclera: Conjunctivae normal.     Right eye: Right conjunctiva is not injected. No chemosis.    Left eye: Left conjunctiva is not injected. No chemosis.    Pupils: Pupils are equal, round, and reactive to light.  Cardiovascular:     Rate and  Rhythm: Normal rate and regular rhythm.     Heart sounds: Normal heart sounds.  Pulmonary:     Effort: Pulmonary effort is normal. No tachypnea, accessory muscle usage or respiratory distress.     Breath sounds: Normal breath sounds. No wheezing, rhonchi or rales.  Chest:     Chest wall: No tenderness.  Abdominal:     Tenderness: There is no abdominal tenderness. There is no guarding or rebound.   Lymphadenopathy:     Head:     Right side of head: No submandibular, tonsillar or occipital adenopathy.     Left side of head: No submandibular, tonsillar or occipital adenopathy.     Cervical: No cervical adenopathy.  Skin:    Coloration: Skin is not pale.     Findings: No abrasion, erythema, petechiae or rash. Rash is not papular, urticarial or vesicular.  Neurological:     Mental Status: He is alert.  Psychiatric:        Behavior: Behavior is cooperative.      Diagnostic studies: labs sent instead         Malachi Bonds, MD [facility]

## 2023-02-11 NOTE — Patient Instructions (Addendum)
1. Swelling of lip, tongue, and throat - We are going to get some lab work to check for allergies to stinging insects. - We are also looking for elevated mast cell activity with a serum tryptase. - We will also look for alpha gal syndrome. - EpiPen training reviewed. - Emergency Anaphylaxis Plan provided.  2. Return in about 1 year (around 02/11/2024). You can have the follow up appointment with Dr. Dellis Anes or a Nurse Practicioner (our Nurse Practitioners are excellent and always have Physician oversight!).   HAVE FUN IN [place]!    Please inform us of any Emergency Department visits, hospitalizations, or changes in symptoms. Call us before going to the ED for breathing or allergy symptoms since we might be able to fit you in for a sick visit. Feel free to contact us anytime with any questions, problems, or concerns.  It was a pleasure to meet you today!  Websites that have reliable patient information: 1. American Academy of Asthma, Allergy, and Immunology: www.aaaai.org 2. Food Allergy Research and Education (FARE): foodallergy.org 3. Mothers of Asthmatics: http://www.asthmacommunitynetwork.org 4. American College of Allergy, Asthma, and Immunology: www.acaai.org   COVID-19 Vaccine Information can be found at: [email or web address] For questions related to vaccine distribution or appointments, please email [email or web address] or call [phone].   We realize that you might be concerned about having an allergic reaction to the COVID19 vaccines. To help with that concern, WE ARE OFFERING THE COVID19 VACCINES IN OUR OFFICE! Ask the front desk for dates!     "Like" [place] on Facebook and Instagram for our latest updates!      A healthy democracy works best when [employer] participate! Make sure you are registered to vote! If you have moved or changed any of your contact information, you will need to get this updated before voting!  Scan the QR codes below to learn more!

## 2023-02-15 LAB — ALPHA-GAL PANEL
Allergen Lamb IgE: 1.66 kU/L — AB
Beef IgE: 3.09 kU/L — AB
IgE (Immunoglobulin E), Serum: 307 IU/mL (ref 6–495)
O215-IgE Alpha-Gal: 8.56 kU/L — AB
Pork IgE: 1.6 kU/L — AB

## 2023-02-20 ENCOUNTER — Encounter: Payer: Self-pay | Admitting: Allergy & Immunology

## 2023-02-20 LAB — C3 AND C4
Complement C3, Serum: 97 mg/dL (ref 82–167)
Complement C4, Serum: 19 mg/dL (ref 12–38)

## 2023-02-20 LAB — HYMENOPTERA VENOM ALLERGY PROF
I001-IgE Honeybee: 1.36 kU/L — AB
I003-IgE Yellow Jacket: 19 kU/L — AB
I004-IgE Paper Wasp: 14.9 kU/L — AB
I208-IgE Api m 1: <0.1 kU/L
I209-IgE Ves v 5: 18.4 kU/L — AB
I210-IgE Pol d 5: 18.7 kU/L — AB
I211-IgE Ves v 1: <0.1 kU/L
I214-IgE Api m 2: 0.25 kU/L — AB
I215-IgE Api m 3: 2.07 kU/L — AB
I216-IgE Api m 5: 5.9 kU/L — AB
I217-IgE Api m 10: <0.1 kU/L
Tryptase: 3.3 ug/L (ref 2.2–13.2)

## 2023-02-20 LAB — TRYPTASE

## 2023-02-20 LAB — ALLERGEN COMPONENT COMMENTS

## 2023-02-24 ENCOUNTER — Telehealth: Payer: Self-pay

## 2023-02-24 NOTE — Telephone Encounter (Signed)
Transition Care Management Unsuccessful Follow-up Telephone Call  Date of discharge and from where:  02/09/2023 [facility]  Attempts:  1st Attempt  Reason for unsuccessful TCM follow-up call:  Left voice message  William Lewis [facility]  [facility], [facility] Guide Direct Dial: [phone]  Website: [email or web address]

## 2023-02-25 ENCOUNTER — Telehealth: Payer: Self-pay

## 2023-02-25 NOTE — Telephone Encounter (Signed)
Transition Care Management Unsuccessful Follow-up Telephone Call  Date of discharge and from where:  02/09/2023 [facility]  Attempts:  2nd Attempt  Reason for unsuccessful TCM follow-up call:  Left voice message  Marijo Quizon [facility]  [facility], [facility] Guide Direct Dial: [phone]  Website: [email or web address]

## 2023-03-23 DIAGNOSIS — H6123 Impacted cerumen, bilateral: Secondary | ICD-10-CM | POA: Diagnosis not present

## 2023-04-01 ENCOUNTER — Other Ambulatory Visit: Payer: Self-pay

## 2023-04-01 ENCOUNTER — Encounter: Payer: Self-pay | Admitting: Allergy & Immunology

## 2023-04-01 ENCOUNTER — Ambulatory Visit: Payer: PPO | Admitting: Allergy & Immunology

## 2023-04-01 VITALS — BP 130/82 | HR 61 | Temp 98.0°F | Resp 17

## 2023-04-01 DIAGNOSIS — R220 Localized swelling, mass and lump, head: Secondary | ICD-10-CM

## 2023-04-01 DIAGNOSIS — R221 Localized swelling, mass and lump, neck: Secondary | ICD-10-CM

## 2023-04-01 DIAGNOSIS — T63481D Toxic effect of venom of other arthropod, accidental (unintentional), subsequent encounter: Secondary | ICD-10-CM | POA: Diagnosis not present

## 2023-04-01 NOTE — Progress Notes (Signed)
FOLLOW UP  Date of Service/Encounter:  04/01/23   Assessment:   Swelling of lip, tongue, and throat - with testing that was positive to honeybee, yellow jackets, and wasp for a stinging insect allergy  Positive alpha gal panel - with unclear clinical significance   Active biker, tennis player, and golfer as well as swimmer   History of prostate cancer status post resection as well as mitral valve regurgitation status post improvement  Plan/Recommendations:   1. Stinging insect allergy (honeybee, yellowjacket, wasp) - CPT codes for venom immunotherapy provided today.  - EpiPen is up to date.  - Call us if you decide to start the venom immunotherapy. - I would just ignore the alpha gal panel since this did not seem to make clinical sense.   2. Return in about 6 months (around 09/30/2023). You can have the follow up appointment with Dr. Dellis Anes or a Nurse Practicioner (our Nurse Practitioners are excellent and always have Physician oversight!).    Subjective:   William Lewis is a 72 y.o. male presenting today for follow up of  Chief Complaint  Patient presents with   Angioedema   Results    COTTER LIGON has a history of the following: Patient Active Problem List   Diagnosis Date Noted   Encounter for prostate cancer screening 02/07/2023   Abnormal CT scan 02/07/2023   Strain of hamstring muscle, subsequent encounter 06/01/2020   Prediabetes 05/29/2020   Subclinical hypothyroidism 05/29/2020   Internal hemorrhoids 04/01/2018   Schatzki's ring 10/17/2017   S/P MVR (mitral valve repair),2007 along with ASD repair 04/26/2013   History of adenomatous polyp of colon 04/14/2013   Vitamin D deficiency 03/30/2012   Hypertension 06/29/2010   Hyperlipidemia 10/21/2007   MITRAL REGURGITATION 10/21/2007   HX, PERSONAL, MALIGNANCY, PROSTATE 10/23/2006    History obtained from: chart review and patient.  Discussed the use of AI scribe software for clinical note  transcription with the patient and/or guardian, who gave verbal consent to proceed.  William Lewis is a 72 y.o. male presenting for a follow up visit.  He was last seen in September 2024.  At that time, we did a workup due to his history of having experienced an allergic reaction which included throat swelling and changes in his voice. Labs were notable for a positive IgE to alpha gal.  We recommended avoiding all red meats.  We also sent a stinging insect panel and he was positive to honeybee's as well as yellowjacket's and wasp.  Since last visit, he has done well.  He did go to [place] and had a great time with his biking trip.  He is planning to go to [place] soon for a hunting trip for 10 days.  The patient presents with concerns regarding recent lab results, specifically the alpha-gal and venom tests. He reports no recent exposure to ticks or any adverse reactions to meat consumption, which he estimates to be about once a week. His diet primarily consists of fish, including shrimp, tuna, flounder, trout, and occasionally cod. He also consumes chicken once or twice a week and beef or lamb once a week. He denies any adverse reactions to these foods.  The patient also discusses a recent severe reaction (the 1 that brought him in initially in September), the cause of which remains unclear. He recalls being stung on the throat by an insect the day before the reaction occurred. However, he finds the 24-hour time lag between the sting and the reaction puzzling.  He reports no similar reactions during recent travels, despite being outdoors frequently.  The patient has experienced significant weight loss, approximately 30 pounds, following the passing of his spouse. He attributes this weight loss to careful diet and increased exercise. He reports feeling better overall and has even participated in a bike trip to [place]. Despite the weight loss and increased activity, he has not noticed any changes in his reaction  patterns.  The patient is considering venom shots due to his frequent outdoor activities, including an upcoming hunting trip to [place]. He expresses concern about the potential for stings and the distance to the nearest hospital during this trip. He has previously been stung in a vine[place] and carries an Engineer, site for emergencies.     Otherwise, there have been no changes to his past medical history, surgical history, family history, or social history.    Review of systems otherwise negative other than that mentioned in the HPI.    Objective:   Blood pressure 130/82, pulse 61, temperature 98 F (36.7 C), temperature source Temporal, resp. rate 17, SpO2 98%. There is no height or weight on file to calculate BMI.    Physical Exam Vitals reviewed.  Constitutional:      Appearance: He is well-developed.     Comments: Pleasant.  HENT:     Head: Normocephalic and atraumatic.     Right Ear: Tympanic membrane, ear canal and external ear normal. No drainage, swelling or tenderness. Tympanic membrane is not injected, scarred, erythematous, retracted or bulging.     Left Ear: Tympanic membrane, ear canal and external ear normal. No drainage, swelling or tenderness. Tympanic membrane is not injected, scarred, erythematous, retracted or bulging.     Nose: No nasal deformity, septal deviation, mucosal edema or rhinorrhea.     Right Turbinates: Not enlarged, swollen or pale.     Left Turbinates: Not enlarged, swollen or pale.     Right Sinus: No maxillary sinus tenderness or frontal sinus tenderness.     Left Sinus: No maxillary sinus tenderness or frontal sinus tenderness.     Mouth/Throat:     Mouth: Mucous membranes are not pale and not dry.     Pharynx: Uvula midline.  Eyes:     General:        Right eye: No discharge.        Left eye: No discharge.     Conjunctiva/sclera: Conjunctivae normal.     Right eye: Right conjunctiva is not injected. No chemosis.    Left eye: Left  conjunctiva is not injected. No chemosis.    Pupils: Pupils are equal, round, and reactive to light.  Cardiovascular:     Rate and Rhythm: Normal rate and regular rhythm.     Heart sounds: Normal heart sounds.  Pulmonary:     Effort: Pulmonary effort is normal. No tachypnea, accessory muscle usage or respiratory distress.     Breath sounds: Normal breath sounds. No wheezing, rhonchi or rales.  Chest:     Chest wall: No tenderness.  Abdominal:     Tenderness: There is no abdominal tenderness. There is no guarding or rebound.  Lymphadenopathy:     Head:     Right side of head: No submandibular, tonsillar or occipital adenopathy.     Left side of head: No submandibular, tonsillar or occipital adenopathy.     Cervical: No cervical adenopathy.  Skin:    Coloration: Skin is not pale.     Findings: No abrasion, erythema, petechiae or  rash. Rash is not papular, urticarial or vesicular.  Neurological:     Mental Status: He is alert.  Psychiatric:        Behavior: Behavior is cooperative.      Diagnostic studies: none      Malachi Bonds, MD  [facility]

## 2023-04-01 NOTE — Patient Instructions (Addendum)
1. Stinging insect allergy (honeybee, yellowjacket, wasp) - CPT codes for venom immunotherapy provided today.  - EpiPen is up to date.  - Call us if you decide to start the venom immunotherapy. - I would just ignore the alpha gal panel since this did not seem to make clinical sense.   2. Return in about 6 months (around 09/30/2023). You can have the follow up appointment with Dr. Dellis Anes or a Nurse Practicioner (our Nurse Practitioners are excellent and always have Physician oversight!).     Please inform us of any Emergency Department visits, hospitalizations, or changes in symptoms. Call us before going to the ED for breathing or allergy symptoms since we might be able to fit you in for a sick visit. Feel free to contact us anytime with any questions, problems, or concerns.  It was a pleasure to see you again today!  Websites that have reliable patient information: 1. American Academy of Asthma, Allergy, and Immunology: www.aaaai.org 2. Food Allergy Research and Education (FARE): foodallergy.org 3. Mothers of Asthmatics: http://www.asthmacommunitynetwork.org 4. American College of Allergy, Asthma, and Immunology: www.acaai.org   COVID-19 Vaccine Information can be found at: PodExchange.nl For questions related to vaccine distribution or appointments, please email [email or web address] or call [phone].   We realize that you might be concerned about having an allergic reaction to the COVID19 vaccines. To help with that concern, WE ARE OFFERING THE COVID19 VACCINES IN OUR OFFICE! Ask the front desk for dates!     "Like" [place] on Facebook and Instagram for our latest updates!      A healthy democracy works best when [employer] participate! Make sure you are registered to vote! If you have moved or changed any of your contact information, you will need to get this updated before voting! Scan the QR codes below to learn  more!

## 2023-04-16 ENCOUNTER — Other Ambulatory Visit (HOSPITAL_COMMUNITY): Payer: Self-pay

## 2023-04-18 ENCOUNTER — Other Ambulatory Visit: Payer: Self-pay

## 2023-04-18 ENCOUNTER — Other Ambulatory Visit (HOSPITAL_COMMUNITY): Payer: Self-pay

## 2023-04-18 DIAGNOSIS — H66001 Acute suppurative otitis media without spontaneous rupture of ear drum, right ear: Secondary | ICD-10-CM | POA: Diagnosis not present

## 2023-04-18 DIAGNOSIS — H60331 Swimmer's ear, right ear: Secondary | ICD-10-CM | POA: Diagnosis not present

## 2023-04-18 DIAGNOSIS — H1031 Unspecified acute conjunctivitis, right eye: Secondary | ICD-10-CM | POA: Diagnosis not present

## 2023-04-18 MED ORDER — CEPHALEXIN 500 MG PO CAPS
ORAL_CAPSULE | ORAL | 0 refills | Status: DC
  Filled 2023-04-18: qty 30, 10d supply, fill #0

## 2023-04-18 MED ORDER — NEOMYCIN-POLYMYXIN-HC 1 % OT SOLN
OTIC | 0 refills | Status: DC
  Filled 2023-04-18: qty 10, 10d supply, fill #0

## 2023-04-18 MED ORDER — SULFACETAMIDE SODIUM 10 % OP SOLN
OPHTHALMIC | 0 refills | Status: DC
  Filled 2023-04-18: qty 15, 10d supply, fill #0

## 2023-04-19 ENCOUNTER — Other Ambulatory Visit (HOSPITAL_COMMUNITY): Payer: Self-pay

## 2023-04-22 ENCOUNTER — Other Ambulatory Visit (HOSPITAL_COMMUNITY): Payer: Self-pay

## 2023-05-26 ENCOUNTER — Other Ambulatory Visit (HOSPITAL_COMMUNITY): Payer: Self-pay

## 2023-05-26 DIAGNOSIS — H532 Diplopia: Secondary | ICD-10-CM | POA: Diagnosis not present

## 2023-05-26 DIAGNOSIS — D3131 Benign neoplasm of right choroid: Secondary | ICD-10-CM | POA: Diagnosis not present

## 2023-05-26 DIAGNOSIS — H52223 Regular astigmatism, bilateral: Secondary | ICD-10-CM | POA: Diagnosis not present

## 2023-05-26 DIAGNOSIS — H2513 Age-related nuclear cataract, bilateral: Secondary | ICD-10-CM | POA: Diagnosis not present

## 2023-05-26 DIAGNOSIS — H5203 Hypermetropia, bilateral: Secondary | ICD-10-CM | POA: Diagnosis not present

## 2023-05-26 DIAGNOSIS — H524 Presbyopia: Secondary | ICD-10-CM | POA: Diagnosis not present

## 2023-05-26 MED ORDER — FLUCONAZOLE 100 MG PO TABS
200.0000 mg | ORAL_TABLET | Freq: Every day | ORAL | 1 refills | Status: DC
  Filled 2023-05-26: qty 16, 14d supply, fill #0

## 2023-05-27 ENCOUNTER — Ambulatory Visit: Payer: PPO | Attending: Cardiovascular Disease | Admitting: Cardiovascular Disease

## 2023-05-27 ENCOUNTER — Encounter: Payer: Self-pay | Admitting: Cardiovascular Disease

## 2023-05-27 DIAGNOSIS — I77810 Thoracic aortic ectasia: Secondary | ICD-10-CM | POA: Diagnosis not present

## 2023-05-27 DIAGNOSIS — Z9889 Other specified postprocedural states: Secondary | ICD-10-CM | POA: Diagnosis not present

## 2023-05-27 DIAGNOSIS — E782 Mixed hyperlipidemia: Secondary | ICD-10-CM | POA: Diagnosis not present

## 2023-05-27 DIAGNOSIS — Z8546 Personal history of malignant neoplasm of prostate: Secondary | ICD-10-CM | POA: Diagnosis not present

## 2023-05-27 DIAGNOSIS — I10 Essential (primary) hypertension: Secondary | ICD-10-CM | POA: Diagnosis not present

## 2023-05-27 DIAGNOSIS — Z8774 Personal history of (corrected) congenital malformations of heart and circulatory system: Secondary | ICD-10-CM

## 2023-05-27 NOTE — Progress Notes (Signed)
Patient ID: [name], male   DOB: 1951/05/19, 72 y.o.   MRN: [id number]        HPI: William Lewis is a 72 y.o. male who presents to the office for a 25 month cardiology followup evaluation.  [name] underwent mitral valve repair robotically by Dr. Lamount Lewis [facility] a 04/10/2006 for significant mitral valve prolapse with partial flail posterior P2 leaflet. He had 4+ preoperative mitral regurgitation and also had an ASD as well as a patent foramen ovale which were also successfully repaired. At the time of his surgery, his left atrial appendage was closed.  Preoperatively, he was documented to have normal coronary arteries by cardiac catheterization. Last year he underwent a 18 month followup echo Doppler study which continued to show excellent result. Left ventricular ejection fraction was 60-65% and he did not have regional wall motion abnormalities. His mitral annular ring prosthesis was present. There is no mention in his current echo of any residual mitral regurgitation whereas on his prior echo in 2013 he was felt to have mild MR. All other valvular anatomy was normal.  He has a remote history of prostate CA which developed at age 70 at which time he underwent surgery.  When I saw him in 2016  he was continuing to be active and had a good year.,he was playing teis regularly and riding his bike. He had lost his brother and is mother which had led to some increased stress. He continues to do well.  He is still working at least 40-50 hours per week.  He is biking less, but playing tennis and golf without cardiovascular restrictions. He is unaware of any palpitations. He denies any chest pain.  He denies presyncope or syncope.  In June 2017 he presented to the emergency room after a piece of chicken had gotten stuck in his esophagus.  He required IV glucagon and subsequently underwent endoscopy by Dr. Kinnie Lewis which reportedly was unremarkable.   He underwent a follow-up echo Doppler  study on 04/22/2016.  Ejection fraction is 60-65%.  Mild LVHwithout wall motion abnormalities.  There was grade 1 diastolic dysfunction.  He is a setting aorta was minimally icreased at 41 mm.  His mitral annulus ring/repair was intact.  There was no mention of mitral regurgion.  There was mild biatrial enlargement.  He had normal pulmonary pressures.  I had seen him in follow-up in December 2017.  I saw him in February 2019 and last saw him in December 2019. 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  He continued to exercise.  He was not biking as much as he had in the past.  He underwent a follow-up echo Doppler study on April 20, 2018 which showed an EF of 60 to 65%.  He had normal wall motion.  His mitral valve repair was stable.  There was no significant residual mitral regurgitation.  Left atrium was mildly dilated.  He denies any chest pain.  He is unaware of any episodes of atrial fibrillation or palpitations.  He had laboratory 2 days ago.  TSH was normal.  Total cholesterol 165, triglycerides 128, HDL 58, LDL cholesterol 81.  Electrolytes were normal.  Renal function and LFTs were normal.    I saw him in Jan 2021 and over the prior year he continued to be stable from a cardiac standpoint.   With the COVID-19 pandemic he was not  playing as much tennis that he had in the past.  He continued to exercise on exercise bike at home.  He denied chest pain or change in exercise capacity.  He was on ramipril 5 mg and metoprolol succinate 12.5 mg daily for blood pressure.  He is unaware of any cardiac arrhythmia.  He is on rosuvastatin 20 mg in addition to omega-3 fatty acid.  He continues takes aspirin and takes supplemental vitamin D.   I saw him in a video telemedicine evaluation on 04/27/2020. Since his prior evaluation he continued to do well. He retired on 12/08/2019. Since that time he had traveled to [place] for 2  weeks. His exercise activity has increased and typically walks every morning with his wife, rides his bike in place tennis regularly. He denies any exertional symptoms. He is unaware of any abnormal heart rhythms. He denies exertional shortness of breath.   I evaluated him in a telemedicine encounter on April 27, 2020.  Prior to that evaluation he had undergone a follow-up echo Doppler study on 04/17/2020 which showed an EF of 60 to 65% with mild LVH. Aortic valve was trileaflet. His mitral valve repair was stable without MR or MS. An annuloplasty ring was present in the mitral position. He was noted to have mild dilatation of his aortic root at 41 mm and of the ascending aorta at 42 mm.  Laboratory was ordered from that office visit.  Total cholesterol was 161, HDL 52, LDL 82, and triglycerides 1 54.  He has been documented to have prediabetes and is followed by Dr. Cheryll Lewis.  I last saw him on May 08, 2021.  At that time he continued to be stable.  He continues to exercise regularly.   He typically plays tennis several days per week and also exercises on his bike.  His weight has been stable.  He has had recent purposeful weight loss through increased activity and since he has retired he is lost over 11 pounds.  He denies chest pain PND orthopnea.  He denies palpitations.  He continues to be on metoprolol succinate 12.5 mg twice a day and ramipril 5 mg.  He is on rosuvastatin 20 mg daily and takes a daily aspirin in addition to omega-3 fatty acid.    Since I last saw him, unfortunately, his wife was diagnosed with recurrent metastatic breast CA to her bone and diffuse spine from a prior breast cancer episode in the late 1990s.  July, her symptoms progressed rapidly and she ultimately died in 2021/12/26.  Subsequently, he had a very difficult time following her death.  He tried to remain active continues to exercise doing swimming hiking and biking with his son who works locally and his daughter who  is an Pensions consultant and [employer].  He was evaluated by William Lewis, on May 24, 2022 for yearly follow-up evaluation and remainse stable.  Prior to that evaluation he had undergone a echo Doppler study on May 08, 2022 which continued to show normal EF at 60 to 65% without wall motion abnormalities.  Left atrium was moderately dilated.  Atrial valve repair was stable.  Moderate calcification of the aortic valve consistent with sclerosis without stenosis.  There is mild dilation of ascending aorta at 39 mm.  The past year, he has continued to be active.  He is now dating his old high school girlfriend who unfortunately is widowed following the death of her husband.  They have been active, traveling together.  This past  summer, he went on a bicycle trip to the [facility] with his son and biked 160 miles with difficult terrain and tolerated it well.  Presently, he denies chest pain or shortness of breath.  He is unaware of palpitations.  He has lost approximately 30 pounds from his peak weight.  He continues to see William Lewis for primary care and will be undergoing laboratory next month.  Continues to be on metoprolol succinate 12.5 mg twice a day, and is on omega-3 fatty acid 1000 mg and rosuvastatin 20 mg for hyperlipidemia.  He is on aspirin 81 mg.  He presents for evaluation.  Past Medical History:  Diagnosis Date   ASD (atrial septal defect) 06/10/2005   repaired with PFO in [place]   Cancer [facility])    Echocardiogram abnormal 06/11/2011   EF 50-55%, normal diastolic parameters, mild mitral regur. with his post leaflet of MV calcified   History of stress test 06/10/2006   neg.for ischemia   HTN (hypertension)    MVP (mitral valve prolapse) 06/10/2005   mitral valve repair in [place]   Other and unspecified hyperlipidemia    Dr Daphene Jaeger    Past Surgical History:  Procedure Laterality Date   CARDIAC CATHETERIZATION  2007   EF 65-70%, significant mitral prolapse wth 4+ MR, normal  coronary arteries    COLONOSCOPY  2005 ; 2010;2014   polyp 2010 , Dr William Lewis   HERNIA REPAIR  2007   I & D EXTREMITY     thumb, for infection   MITRAL VALVE REPAIR  2007   Robotic surgery @ [address] [place] , [place] and ASD repair and PFO repair   PROSTATE SURGERY  1995   TURP , Dr Aldean Ast    Allergies  Allergen Reactions   Poison Ivy Extract    Yellow Jacket Venom     Current Outpatient Medications  Medication Sig Dispense Refill   aspirin 81 MG tablet Take 81 mg by mouth daily.     Cholecalciferol (VITAMIN D3) 2000 UNITS TABS Take by mouth daily.     EPINEPHrine 0.3 mg/0.3 mL IJ SOAJ injection Inject 0.3 mg into the muscle as needed for anaphylaxis. 2 each 0   fish oil-omega-3 fatty acids 1000 MG capsule Take by mouth daily.     fluconazole (DIFLUCAN) 100 MG tablet Take 2 tablets by mouth first day, then one tablet by mouth daily for 2 weeks 16 tablet 1   metoprolol succinate (TOPROL-XL) 25 MG 24 hr tablet Take 1/2 tablet (12.5mg  total) by mouth twice a day 90 tablet 2   rosuvastatin (CRESTOR) 20 MG tablet Take 1 tablet (20 mg total) by mouth at bedtime. 90 tablet 2   No current facility-administered medications for this visit.    Social History   Socioeconomic History   Marital status: Widowed    Spouse name: Not on file   Number of children: Not on file   Years of education: Not on file   Highest education level: Master's degree (e.g., MA, MS, MEng, MEd, MSW, MBA)  Occupational History   Not on file  Tobacco Use   Smoking status: Former    Current packs/day: 0.00    Types: Cigarettes    Quit date: 06/10/1977    Years since quitting: 45.9    Passive exposure: Past   Smokeless tobacco: Never   Tobacco comments:    from age [age], up to 1 ppd or less  Vaping Use   Vaping status: Never Used  Substance and Sexual  Activity   Alcohol use: Yes    Alcohol/week: 14.0 standard drinks of alcohol    Types: 14 Glasses of wine per week   Drug use: No   Sexual activity: Not  on file  Other Topics Concern   Not on file  Social History Narrative   Gets reg exercise   Social Drivers of Health   Financial Resource Strain: Low Risk  (02/06/2023)   Overall Financial Resource Strain (CARDIA)    Difficulty of Paying Living Expenses: Not hard at all  Food Insecurity: No Food Insecurity (02/06/2023)   Hunger Vital Sign    Worried About Running Out of Food in the Last Year: Never true    Ran Out of Food in the Last Year: Never true  Transportation Needs: No Transportation Needs (02/06/2023)   PRAPARE - Administrator, Civil Service (Medical): No    Lack of Transportation (Non-Medical): No  Physical Activity: Sufficiently Active (02/06/2023)   Exercise Vital Sign    Days of Exercise per Week: 7 days    Minutes of Exercise per Session: 30 min  Stress: No Stress Concern Present (02/06/2023)   [employer] of Occupational Health - Occupational Stress Questionnaire    Feeling of Stress : Not at all  Social Connections: Moderately Integrated (02/06/2023)   Social Connection and Isolation Panel [NHANES]    Frequency of Communication with Friends and Family: More than three times a week    Frequency of Social Gatherings with Friends and Family: More than three times a week    Attends Religious Services: More than 4 times per year    Active Member of [employer] or Organizations: Yes    Attends Banker Meetings: More than 4 times per year    Marital Status: Widowed  Intimate Partner Violence: Not At Risk (06/15/2021)   Humiliation, Afraid, Rape, and Kick questionnaire    Fear of Current or Ex-Partner: No    Emotionally Abused: No    Physically Abused: No    Sexually Abused: No    Family History  Problem Relation Age of Onset   Cancer Mother 97       ? intra-abdominal   Esophageal cancer Father    Hypertension Father    Cancer Father        throat ; smoker   Prostate cancer Father    AAA (abdominal aortic aneurysm) Maternal Grandmother     Stroke Paternal Grandmother        late 35s   Heart disease Neg Hx    Diabetes Neg Hx    Colon cancer Neg Hx    Colon polyps Neg Hx    Rectal cancer Neg Hx    Stomach cancer Neg Hx    Social history is notable that he is married he has 2 children. He does exercise. Is no tobacco or alcohol use.  His daughter is an attorney in [place] involved with healthcare and a large firm doing exceptionally well.  His son is working in [place].  He is expecting his first grandchild April 2019   ROS General: Negative; No fevers, chills, or night sweats;  HEENT: Negative; No changes in vision or hearing, sinus congestion, difficulty swallowing Pulmonary: Negative; No cough, wheezing, shortness of breath, hemoptysis Cardiovascular: Negative; No chest pain, presyncope, syncope, palpitations GI: Negative; No nausea, vomiting, diarrhea, or abdominal pain GU: Negative; No dysuria, hematuria, or difficulty voiding Musculoskeletal: Negative; no myalgias, joint pain, or weakness Hematologic/Oncology: Negative; no easy bruising, bleeding Endocrine: Negative;  no heat/cold intolerance; no diabetes Neuro: Negative; no changes in balance, headaches Skin: Negative; No rashes or skin lesions Psychiatric: Negative; No behavioral problems, depression Sleep: Negative; No snoring, daytime sleepiness, hypersomnolence, bruxism, restless legs, hypnogognic hallucinations, no cataplexy Other comprehensive 14 point system review is negative.  PE BP 126/84   Pulse 62   Ht 6' (1.829 m)   Wt 199 lb 9.6 oz (90.5 kg)   SpO2 97%   BMI 27.07 kg/m    Repeat blood pressure by me was 134/76.  Wt Readings from Last 3 Encounters:  05/27/23 199 lb 9.6 oz (90.5 kg)  02/11/23 201 lb (91.2 kg)  02/09/23 199 lb (90.3 kg)   General: Alert, oriented, no distress.  Skin: normal turgor, no rashes, warm and dry HEENT: Normocephalic, atraumatic. Pupils equal round and reactive to light; sclera anicteric; extraocular muscles  intact;  Nose without nasal septal hypertrophy Mouth/Parynx benign; Mallinpatti scale 2/3 Neck: No JVD, no carotid bruits; normal carotid upstroke Lungs: clear to ausculatation and percussion; no wheezing or rales Chest wall: without tenderness to palpitation Heart: PMI not displaced, RRR, s1 s2 normal, 1/6 systolic murmur, no diastolic murmur, no rubs, gallops, thrills, or heaves Abdomen: soft, nontender; no hepatosplenomehaly, BS+; abdominal aorta nontender and not dilated by palpation. Back: no CVA tenderness Pulses 2+ Musculoskeletal: full range of motion, normal strength, no joint deformities Extremities: no clubbing cyanosis or edema, Homan's sign negative  Neurologic: grossly nonfocal; Cranial nerves grossly wnl Psychologic: Normal mood and affect   EKG Interpretation Date/Time:  Tuesday May 27 2023 14:51:36 EST Ventricular Rate:  62 PR Interval:  202 QRS Duration:  78 QT Interval:  410 QTC Calculation: 416 R Axis:   -15  Text Interpretation: Sinus rhythm with Premature atrial complexes When compared with ECG of 09-Feb-2023 13:34, PREVIOUS ECG IS PRESENT Confirmed by Nicki Guadalajara ([id number]) on 05/27/2023 4:11:22 PM     May 08, 2021 ECG (independently read by me):  Sinus rhtym with PAC and sinus arrythmia  June 17, 2019 ECG (independently read by me): Normal sinus rhythm at 62 bpm.  Left axis deviation.  No ectopy.  Normal intervals.  December 2019 ECG (independently read by me): Sinus bradycardia 52 bpm.  No ectopy.  Normal intervals.  July 21, 2017 ECG (independently read by me): Sinus rhythm at 66 bpm with PAC.  Borderline first-degree block with a PR interval of 22 ms  December 2017 ECG (independently read by me): sinus bradycardia 59 bpm with a PAC.  Normal intervals.  NST segment changes.  November 2016 ECG (independently read by me): Normal sinus rhythm with an isolated PAC.  Normal intervals.  No ST segment changes.  November 2015 ECG (independently  read by me): NSR at 57; PR interval 220 ms.  QTc interval normal  Prior November 2014 ECG: Sinus at 71 beats per minute. No significant change the  LABS:     Latest Ref Rng & Units 02/09/2023    1:34 PM 06/14/2022    8:05 AM 06/13/2021    8:49 AM  BMP  Glucose 70 - 99 mg/dL 295  96  97   BUN 8 - 23 mg/dL 19  18  18    Creatinine 0.61 - 1.24 mg/dL 6.21  3.08  6.57   Sodium 135 - 145 mmol/L 138  138  138   Potassium 3.5 - 5.1 mmol/L 4.0  4.4  4.2   Chloride 98 - 111 mmol/L 102  102  104   CO2 22 - 32 mmol/L  23  25  24    Calcium 8.9 - 10.3 mg/dL 8.8  9.2  9.1       Latest Ref Rng & Units 06/14/2022    8:05 AM 06/13/2021    8:49 AM 05/03/2020    9:00 AM  Hepatic Function  Total Protein 6.0 - 8.3 g/dL 6.5  6.6  6.5   Albumin 3.5 - 5.2 g/dL 4.1  4.1  4.3   AST 0 - 37 U/L 16  15  17    ALT 0 - 53 U/L 13  12  16    Alk Phosphatase 39 - 117 U/L 42  47  68   Total Bilirubin 0.2 - 1.2 mg/dL 0.4  0.6  0.3       Latest Ref Rng & Units 02/09/2023    1:34 PM 06/14/2022    8:05 AM 06/13/2021    8:49 AM  CBC  WBC 4.0 - 10.5 K/uL 5.6  5.7  5.5   Hemoglobin 13.0 - 17.0 g/dL 40.9  81.1  91.4   Hematocrit 39.0 - 52.0 % 45.9  42.4  43.6   Platelets 150 - 400 K/uL 237  303.0  217.0    Lab Results  Component Value Date   MCV 88.3 02/09/2023   MCV 88.7 06/14/2022   MCV 88.2 06/13/2021   Lab Results  Component Value Date   TSH 4.20 06/14/2022   Lab Results  Component Value Date   HGBA1C 6.3 06/14/2022     Lab Results  Component Value Date   PSA 0.00 Repeated and verified X2. (L) 02/07/2023   PSA 0.1 05/15/2016   PSA <0.01 05/03/2015   Lipid Panel     Component Value Date/Time   CHOL 121 06/14/2022 0805   CHOL 161 05/03/2020 0900   TRIG 58.0 06/14/2022 0805   HDL 49.30 06/14/2022 0805   HDL 52 05/03/2020 0900   CHOLHDL 2 06/14/2022 0805   VLDL 11.6 06/14/2022 0805   LDLCALC 60 06/14/2022 0805   LDLCALC 82 05/03/2020 0900   LDLDIRECT 90.0 06/13/2021 0849   RADIOLOGY: No results  found.  IMPRESSION:  1. S/P MVR (mitral valve repair), 2007   2. H/O small atrial septal defect (ASD) and PFO repair   3. Primary hypertension   4. Mild dilation of ascending aorta (HCC)   5. Mixed hyperlipidemia   6. History of prostate cancer     ASSESSMENT AND PLAN: Mr. Potempa is a 72 year-old Caucasian male who is status post da Vinci robotic mitral valve repair surgery performed on 04/10/2006 for severe MR due to a ruptured chordae at which time he had placement of 4 new Gore-Tex chordae, 2 from the medial, 2 from the lateral muscle which were brought to the edges of the posterior leaflet. He also had repair of a small atrial septal defect as well as a patent foramen ovale. Preoperatively he had normal coronary arteries. He does not have any anginal symptomatology and has remained asymptomatic postoperatively.  An echo Doppler study from November 2021 continued to show normal systolic function with EF at 60 to 65%.  There was mild LVH.  There was stable mitral valve repair without stenosis or regurgitation.  There was no intra-atrial shunting.  He had mild dilatation of his ascending aorta at 42 mm and aortic root at 41 mm.  Seen by me in November 2022 he remained stable.  Unfortunately his wife who spent all his time with died in 07/03/23after she developed many years later metastatic  breast cancer to her spine and bones.  This hit him very hard but ultimately over time he has significantly improved.  At his evaluation 1 year ago with William Lewis he remained stable.  An echo Doppler study in November 2029 continue to show EF 60 to 65%.  There was moderate LA dilation.  Mitral valve repair was stable as was his ASD and PFO repairs.  There was mild dilation of ascending aorta on that exam at 39 mm.  [place]coli he feels he is in currently the best shape that he has been in some time.  He is lost 30 pounds from his peak weight of 228 to current weight at home approximately 190 2-1 95.  He has  reconnected with his old high school girlfriend' whose husband had died is now traveling with her.  He also is exercising regularly,swimming every day frequently plays golf and tennis in addition to bike riding.  Blood pressure today is stable on his current regimen of metoprolol succinate 12.5 mg twice a day.  ECG today shows sinus rhythm with a rare PAC.  Of note last summer he was stung by bee and ECG at that time showed atrial bigeminal rhythm.  I have recommended in November 2025 he undergo a 2-year follow-up echo Doppler evaluation.  I discussed my plans for retirement with him in mid 2025.  I will transition him to the care of Dr. Royann Shivers to see him in follow-up of his November 2025 echocardiographic evaluation.  He will be seeing his primary physician, Dr. Cheryll Lewis next month and complete laboratory will be obtained.    Lennette Bihari, MD, [facility]  05/27/2023 4:35 PM

## 2023-05-27 NOTE — Patient Instructions (Addendum)
Medication Instructions:  The current medical regimen is effective;  continue present plan and medications as directed. Please refer to the Current Medication list given to you today.     *If you need a refill on your cardiac medications before your next appointment, please call your pharmacy*   Lab Work: No labs were ordered during today's visit.  If you have labs (blood work) drawn today and your tests are completely normal, you will receive your results only by: MyChart Message (if you have MyChart) OR A paper copy in the mail If you have any lab test that is abnormal or we need to change your treatment, we will call you to review the results.   Testing/Procedures: Your physician has requested that you have an echocardiogram in November 2025. Echocardiography is a painless test that uses sound waves to create images of your heart. It provides your doctor with information about the size and shape of your heart and how well your heart's chambers and valves are working. This procedure takes approximately one hour. There are no restrictions for this procedure. Please do NOT wear cologne, perfume, aftershave, or lotions (deodorant is allowed). Please arrive 15 minutes prior to your appointment time.  Please note: We ask at that you not bring children with you during ultrasound (echo/ vascular) testing. Due to room size and safety concerns, children are not allowed in the ultrasound rooms during exams. Our front office staff cannot provide observation of children in our lobby area while testing is being conducted. An adult accompanying a patient to their appointment will only be allowed in the ultrasound room at the discretion of the ultrasound technician under special circumstances. We apologize for any inconvenience.    Follow-Up: At [facility], you and your health needs are our priority.  As part of our continuing mission to provide you with exceptional heart care, we have created  designated Provider Care Teams.  These Care Teams include your primary Cardiologist (physician) and Advanced Practice Providers (APPs -  Physician Assistants and Nurse Practitioners) who all work together to provide you with the care you need, when you need it.  We recommend signing up for the patient portal called "MyChart".  Sign up information is provided on this After Visit Summary.  MyChart is used to connect with patients for Virtual Visits (Telemedicine).  Patients are able to view lab/test results, encounter notes, upcoming appointments, etc.  Non-urgent messages can be sent to your provider as well.   To learn more about what you can do with MyChart, go to [email or web address].    Your next appointment:   1 year(s)  Provider:   Dr. Rachelle Hora Croitoru

## 2023-06-23 ENCOUNTER — Other Ambulatory Visit: Payer: Self-pay | Admitting: Nurse Practitioner

## 2023-06-23 ENCOUNTER — Encounter: Payer: Self-pay | Admitting: Internal Medicine

## 2023-06-23 ENCOUNTER — Telehealth: Payer: Self-pay | Admitting: Internal Medicine

## 2023-06-23 NOTE — Patient Instructions (Addendum)
 Blood work was ordered.       Medications changes include :   None    A referral was ordered and someone will call you to schedule an appointment.     Return in about 1 year (around 06/23/2024) for follow up.   Health Maintenance, Male Adopting a healthy lifestyle and getting preventive care are important in promoting health and wellness. Ask your health care provider about: The right schedule for you to have regular tests and exams. Things you can do on your own to prevent diseases and keep yourself healthy. What should I know about diet, weight, and exercise? Eat a healthy diet  Eat a diet that includes plenty of vegetables, fruits, low-fat dairy products, and lean protein. Do not eat a lot of foods that are high in solid fats, added sugars, or sodium. Maintain a healthy weight Body mass index (BMI) is a measurement that can be used to identify possible weight problems. It estimates body fat based on height and weight. Your health care provider can help determine your BMI and help you achieve or maintain a healthy weight. Get regular exercise Get regular exercise. This is one of the most important things you can do for your health. Most adults should: Exercise for at least 150 minutes each week. The exercise should increase your heart rate and make you sweat (moderate-intensity exercise). Do strengthening exercises at least twice a week. This is in addition to the moderate-intensity exercise. Spend less time sitting. Even light physical activity can be beneficial. Watch cholesterol and blood lipids Have your blood tested for lipids and cholesterol at 75 years of age, then have this test every 5 years. You may need to have your cholesterol levels checked more often if: Your lipid or cholesterol levels are high. You are older than 75 years of age. You are at high risk for heart disease. What should I know about cancer screening? Many types of cancers can be detected early  and may often be prevented. Depending on your health history and family history, you may need to have cancer screening at various ages. This may include screening for: Colorectal cancer. Prostate cancer. Skin cancer. Lung cancer. What should I know about heart disease, diabetes, and high blood pressure? Blood pressure and heart disease High blood pressure causes heart disease and increases the risk of stroke. This is more likely to develop in people who have high blood pressure readings or are overweight. Talk with your health care provider about your target blood pressure readings. Have your blood pressure checked: Every 3-5 years if you are 67-62 years of age. Every year if you are 86 years old or older. If you are between the ages of 26 and 86 and are a current or former smoker, ask your health care provider if you should have a one-time screening for abdominal aortic aneurysm (AAA). Diabetes Have regular diabetes screenings. This checks your fasting blood sugar level. Have the screening done: Once every three years after age 13 if you are at a normal weight and have a low risk for diabetes. More often and at a younger age if you are overweight or have a high risk for diabetes. What should I know about preventing infection? Hepatitis B If you have a higher risk for hepatitis B, you should be screened for this virus. Talk with your health care provider to find out if you are at risk for hepatitis B infection. Hepatitis C Blood testing is recommended for: Everyone  born from [birth year] through 1965. Anyone with known risk factors for hepatitis C. Sexually transmitted infections (STIs) You should be screened each year for STIs, including gonorrhea and chlamydia, if: You are sexually active and are younger than 75 years of age. You are older than 75 years of age and your health care provider tells you that you are at risk for this type of infection. Your sexual activity has changed since you were  last screened, and you are at increased risk for chlamydia or gonorrhea. Ask your health care provider if you are at risk. Ask your health care provider about whether you are at high risk for HIV. Your health care provider may recommend a prescription medicine to help prevent HIV infection. If you choose to take medicine to prevent HIV, you should first get tested for HIV. You should then be tested every 3 months for as long as you are taking the medicine. Follow these instructions at home: Alcohol use Do not drink alcohol if your health care provider tells you not to drink. If you drink alcohol: Limit how much you have to 0-2 drinks a day. Know how much alcohol is in your drink. In the [place] one drink equals one 12 oz bottle of beer (355 mL), one 5 oz glass of wine (148 mL), or one 1 oz glass of hard liquor (44 mL). Lifestyle Do not use any products that contain nicotine or tobacco. These products include cigarettes, chewing tobacco, and vaping devices, such as e-cigarettes. If you need help quitting, ask your health care provider. Do not use street drugs. Do not share needles. Ask your health care provider for help if you need support or information about quitting drugs. General instructions Schedule regular health, dental, and eye exams. Stay current with your vaccines. Tell your health care provider if: You often feel depressed. You have ever been abused or do not feel safe at home. Summary Adopting a healthy lifestyle and getting preventive care are important in promoting health and wellness. Follow your health care provider's instructions about healthy diet, exercising, and getting tested or screened for diseases. Follow your health care provider's instructions on monitoring your cholesterol and blood pressure. This information is not intended to replace advice given to you by your health care provider. Make sure you discuss any questions you have with your health care  provider. Document Revised: 10/16/2020 Document Reviewed: 10/16/2020 Elsevier Patient Education  2024 [employer].

## 2023-06-23 NOTE — Progress Notes (Signed)
    Subjective:    Patient ID: William Lewis, male    DOB: [birth year], 73 y.o.   MRN: [id number]     HPI Kadien is here for a physical exam and his chronic medical problems.   Had anaphylactic rxn last fall - ended of in ED, saw allergy  - he was found to be allergic to honeybees, yellow jackets and wasps which was likely the cause.  Also incidentally found to be positive for alpha gal panel.  He denied any issues with eating those foods. ? Clinical significance.    Medications and allergies reviewed with patient and updated if appropriate.  Current Outpatient Medications on File Prior to Visit  Medication Sig Dispense Refill  . aspirin 81 MG tablet Take 81 mg by mouth daily.    . Cholecalciferol (VITAMIN D3) 2000 UNITS TABS Take by mouth daily.    . EPINEPHrine  0.3 mg/0.3 mL IJ SOAJ injection Inject 0.3 mg into the muscle as needed for anaphylaxis. 2 each 0  . fish oil-omega-3 fatty acids 1000 MG capsule Take by mouth daily.     No current facility-administered medications on file prior to visit.    Review of Systems     Objective:  There were no vitals filed for this visit. There were no vitals filed for this visit. There is no height or weight on file to calculate BMI.  BP Readings from Last 3 Encounters:  09/30/23 138/84  08/01/23 130/74  05/27/23 126/84    Wt Readings from Last 3 Encounters:  08/01/23 198 lb (89.8 kg)  05/27/23 199 lb 9.6 oz (90.5 kg)  02/11/23 201 lb (91.2 kg)      Physical Exam Constitutional: He appears well-developed and well-nourished. No distress.  HENT:  Head: Normocephalic and atraumatic.  Right Ear: External ear normal.  Left Ear: External ear normal.  Normal ear canals and TM b/l  Mouth/Throat: Oropharynx is clear and moist. Eyes: Conjunctivae and EOM are normal.  Neck: Neck supple. No tracheal deviation present. No thyromegaly present.  No carotid bruit  Cardiovascular: Normal rate, regular rhythm, normal heart sounds and  intact distal pulses.   No murmur heard.  No lower extremity edema. Pulmonary/Chest: Effort normal and breath sounds normal. No respiratory distress. He has no wheezes. He has no rales.  Abdominal: Soft. He exhibits no distension. There is no tenderness.  Genitourinary: deferred  Lymphadenopathy:   He has no cervical adenopathy.  Skin: Skin is warm and dry. He is not diaphoretic.  Psychiatric: He has a normal mood and affect. His behavior is normal.         Assessment & Plan:   Physical exam: Screening blood work  ordered Exercise    Weight   Substance abuse   none   Reviewed recommended immunizations.   Health Maintenance  Topic Date Due  . Medicare Annual Wellness (AWV)  06/15/2022  . COVID-19 Vaccine (8 - Pfizer risk 2024-25 season) 08/10/2023  . INFLUENZA VACCINE  01/09/2024  . DTaP/Tdap/Td (3 - Td or Tdap) 10/27/2028  . Pneumonia Vaccine 102+ Years old  Completed  . Hepatitis C Screening  Completed  . Zoster Vaccines- Shingrix  Completed  . HPV VACCINES  Aged Out  . Meningococcal B Vaccine  Aged Out  . Colonoscopy  Discontinued     See Problem List for Assessment and Plan of chronic medical problems.   This encounter was created in error - please disregard.

## 2023-06-23 NOTE — Telephone Encounter (Signed)
 Can pt labs be ordered before his physical appointment or should he reschedule his physical?  Please advise, Thanks

## 2023-06-24 ENCOUNTER — Other Ambulatory Visit (HOSPITAL_COMMUNITY): Payer: Self-pay

## 2023-06-24 ENCOUNTER — Other Ambulatory Visit: Payer: Self-pay | Admitting: Cardiovascular Disease

## 2023-06-24 ENCOUNTER — Encounter: Payer: PPO | Admitting: Internal Medicine

## 2023-06-24 DIAGNOSIS — E782 Mixed hyperlipidemia: Secondary | ICD-10-CM

## 2023-06-24 DIAGNOSIS — I10 Essential (primary) hypertension: Secondary | ICD-10-CM

## 2023-06-24 DIAGNOSIS — Z0000 Encounter for general adult medical examination without abnormal findings: Secondary | ICD-10-CM

## 2023-06-24 DIAGNOSIS — E038 Other specified hypothyroidism: Secondary | ICD-10-CM

## 2023-06-24 DIAGNOSIS — Z113 Encounter for screening for infections with a predominantly sexual mode of transmission: Secondary | ICD-10-CM

## 2023-06-24 DIAGNOSIS — R7303 Prediabetes: Secondary | ICD-10-CM

## 2023-06-24 DIAGNOSIS — E559 Vitamin D deficiency, unspecified: Secondary | ICD-10-CM

## 2023-06-24 NOTE — Telephone Encounter (Signed)
 Message left for patient today.   He can keep appointment today and do labs afterwards or right before appointment. However the results will not be back in time if he was wanting to have lab results discussed during his visit.  In the future if he wants lab results given during his appointment he should plan to schedule his lab appointment a few days prior to allow results to come back in time.

## 2023-06-27 ENCOUNTER — Telehealth: Payer: Self-pay | Admitting: Cardiovascular Disease

## 2023-06-27 ENCOUNTER — Other Ambulatory Visit (HOSPITAL_COMMUNITY): Payer: Self-pay

## 2023-06-27 MED ORDER — METOPROLOL SUCCINATE ER 25 MG PO TB24
ORAL_TABLET | ORAL | 3 refills | Status: DC
  Filled 2023-06-27: qty 90, 90d supply, fill #0
  Filled 2023-09-12: qty 90, 90d supply, fill #1
  Filled 2023-12-23: qty 90, 90d supply, fill #2

## 2023-06-27 MED ORDER — ROSUVASTATIN CALCIUM 20 MG PO TABS
20.0000 mg | ORAL_TABLET | Freq: Every day | ORAL | 3 refills | Status: DC
  Filled 2023-06-27: qty 90, 90d supply, fill #0
  Filled 2023-09-12: qty 90, 90d supply, fill #1
  Filled 2023-12-23: qty 90, 90d supply, fill #2
  Filled 2024-03-21: qty 90, 90d supply, fill #3

## 2023-06-27 NOTE — Telephone Encounter (Signed)
Pt's medications were sent to pt's pharmacy as requested. Confirmation received.  

## 2023-06-27 NOTE — Telephone Encounter (Signed)
*  STAT* If patient is at the pharmacy, call can be transferred to refill team.   1. Which medications need to be refilled? (please list name of each medication and dose if known) metoprolol succinate (TOPROL-XL) 25 MG 24 hr tablet rosuvastatin (CRESTOR) 20 MG tablet  2. Which pharmacy/location (including street and city if local pharmacy) is medication to be sent to? [place] [facility]   3. Do they need a 30 day or 90 day supply?   90 day supply

## 2023-07-02 ENCOUNTER — Ambulatory Visit (HOSPITAL_COMMUNITY): Payer: PPO

## 2023-07-02 ENCOUNTER — Telehealth: Payer: Self-pay | Admitting: Internal Medicine

## 2023-07-02 NOTE — Telephone Encounter (Signed)
Pt has CPE scheduled 2/21 and lab appt scheduled 2/14.  Please enter lab orders.

## 2023-07-03 NOTE — Telephone Encounter (Signed)
Lab ordered previously

## 2023-07-04 ENCOUNTER — Ambulatory Visit (HOSPITAL_COMMUNITY): Payer: PPO | Attending: Pediatrics

## 2023-07-04 DIAGNOSIS — I358 Other nonrheumatic aortic valve disorders: Secondary | ICD-10-CM | POA: Insufficient documentation

## 2023-07-04 DIAGNOSIS — I77810 Thoracic aortic ectasia: Secondary | ICD-10-CM | POA: Insufficient documentation

## 2023-07-04 DIAGNOSIS — I10 Essential (primary) hypertension: Secondary | ICD-10-CM | POA: Diagnosis not present

## 2023-07-04 DIAGNOSIS — I371 Nonrheumatic pulmonary valve insufficiency: Secondary | ICD-10-CM | POA: Diagnosis not present

## 2023-07-04 DIAGNOSIS — E785 Hyperlipidemia, unspecified: Secondary | ICD-10-CM | POA: Diagnosis not present

## 2023-07-04 DIAGNOSIS — Z954 Presence of other heart-valve replacement: Secondary | ICD-10-CM | POA: Diagnosis not present

## 2023-07-04 DIAGNOSIS — Z9889 Other specified postprocedural states: Secondary | ICD-10-CM | POA: Diagnosis not present

## 2023-07-04 LAB — ECHOCARDIOGRAM COMPLETE
Area-P 1/2: 2.46 cm2
MV VTI: 2.58 cm2
S' Lateral: 3 cm

## 2023-07-09 ENCOUNTER — Encounter: Payer: Self-pay | Admitting: Cardiology

## 2023-07-21 ENCOUNTER — Other Ambulatory Visit (HOSPITAL_COMMUNITY): Payer: Self-pay

## 2023-07-21 DIAGNOSIS — S61412A Laceration without foreign body of left hand, initial encounter: Secondary | ICD-10-CM | POA: Diagnosis not present

## 2023-07-21 DIAGNOSIS — L710 Perioral dermatitis: Secondary | ICD-10-CM | POA: Diagnosis not present

## 2023-07-21 DIAGNOSIS — R21 Rash and other nonspecific skin eruption: Secondary | ICD-10-CM | POA: Diagnosis not present

## 2023-07-21 DIAGNOSIS — Z85828 Personal history of other malignant neoplasm of skin: Secondary | ICD-10-CM | POA: Diagnosis not present

## 2023-07-21 DIAGNOSIS — K130 Diseases of lips: Secondary | ICD-10-CM | POA: Diagnosis not present

## 2023-07-21 DIAGNOSIS — W260XXA Contact with knife, initial encounter: Secondary | ICD-10-CM | POA: Diagnosis not present

## 2023-07-21 MED ORDER — METRONIDAZOLE 0.75 % EX CREA
TOPICAL_CREAM | CUTANEOUS | 1 refills | Status: DC
  Filled 2023-07-21: qty 45, 30d supply, fill #0

## 2023-07-21 MED ORDER — NYSTATIN-TRIAMCINOLONE 100000-0.1 UNIT/GM-% EX OINT
TOPICAL_OINTMENT | CUTANEOUS | 1 refills | Status: DC
Start: 2023-07-21 — End: 2023-09-30
  Filled 2023-07-21: qty 30, 15d supply, fill #0

## 2023-07-29 ENCOUNTER — Other Ambulatory Visit: Payer: Self-pay | Admitting: Internal Medicine

## 2023-07-29 ENCOUNTER — Other Ambulatory Visit (INDEPENDENT_AMBULATORY_CARE_PROVIDER_SITE_OTHER): Payer: PPO

## 2023-07-29 DIAGNOSIS — E782 Mixed hyperlipidemia: Secondary | ICD-10-CM

## 2023-07-29 DIAGNOSIS — E559 Vitamin D deficiency, unspecified: Secondary | ICD-10-CM

## 2023-07-29 DIAGNOSIS — Z125 Encounter for screening for malignant neoplasm of prostate: Secondary | ICD-10-CM

## 2023-07-29 DIAGNOSIS — R7303 Prediabetes: Secondary | ICD-10-CM

## 2023-07-29 DIAGNOSIS — E038 Other specified hypothyroidism: Secondary | ICD-10-CM

## 2023-07-29 LAB — CBC WITH DIFFERENTIAL/PLATELET
Basophils Absolute: 0 10*3/uL (ref 0.0–0.1)
Basophils Relative: 1.1 % (ref 0.0–3.0)
Eosinophils Absolute: 0.2 10*3/uL (ref 0.0–0.7)
Eosinophils Relative: 3.7 % (ref 0.0–5.0)
HCT: 42.7 % (ref 39.0–52.0)
Hemoglobin: 14 g/dL (ref 13.0–17.0)
Lymphocytes Relative: 27.9 % (ref 12.0–46.0)
Lymphs Abs: 1.3 10*3/uL (ref 0.7–4.0)
MCHC: 32.8 g/dL (ref 30.0–36.0)
MCV: 89.7 fL (ref 78.0–100.0)
Monocytes Absolute: 0.5 10*3/uL (ref 0.1–1.0)
Monocytes Relative: 11.3 % (ref 3.0–12.0)
Neutro Abs: 2.6 10*3/uL (ref 1.4–7.7)
Neutrophils Relative %: 56 % (ref 43.0–77.0)
Platelets: 221 10*3/uL (ref 150.0–400.0)
RBC: 4.76 Mil/uL (ref 4.22–5.81)
RDW: 14.6 % (ref 11.5–15.5)
WBC: 4.7 10*3/uL (ref 4.0–10.5)

## 2023-07-29 LAB — COMPREHENSIVE METABOLIC PANEL
ALT: 12 U/L (ref 0–53)
AST: 17 U/L (ref 0–37)
Albumin: 4.2 g/dL (ref 3.5–5.2)
Alkaline Phosphatase: 55 U/L (ref 39–117)
BUN: 17 mg/dL (ref 6–23)
CO2: 27 meq/L (ref 19–32)
Calcium: 8.8 mg/dL (ref 8.4–10.5)
Chloride: 106 meq/L (ref 96–112)
Creatinine, Ser: 1 mg/dL (ref 0.40–1.50)
GFR: 74.24 mL/min (ref >60.00)
Glucose, Bld: 95 mg/dL (ref 70–99)
Potassium: 4.7 meq/L (ref 3.5–5.1)
Sodium: 142 meq/L (ref 135–145)
Total Bilirubin: 0.3 mg/dL (ref 0.2–1.2)
Total Protein: 6.4 g/dL (ref 6.0–8.3)

## 2023-07-29 LAB — VITAMIN D 25 HYDROXY (VIT D DEFICIENCY, FRACTURES): VITD: 30.8 ng/mL (ref 30.00–100.00)

## 2023-07-29 LAB — LIPID PANEL
Cholesterol: 142 mg/dL (ref 0–200)
HDL: 57.2 mg/dL (ref >39.00)
LDL Cholesterol: 62 mg/dL (ref 0–99)
NonHDL: 85.29
Total CHOL/HDL Ratio: 2
Triglycerides: 115 mg/dL (ref 0.0–149.0)
VLDL: 23 mg/dL (ref 0.0–40.0)

## 2023-07-29 LAB — HEMOGLOBIN A1C: Hgb A1c MFr Bld: 6.4 % (ref 4.6–6.5)

## 2023-07-29 LAB — TSH: TSH: 4.25 u[IU]/mL (ref 0.35–5.50)

## 2023-07-29 LAB — PSA, MEDICARE: PSA: 0.01 ng/mL — ABNORMAL LOW (ref 0.10–4.00)

## 2023-07-31 NOTE — Patient Instructions (Addendum)
Blood work was ordered.       Medications changes include :   None    A referral was ordered and someone will call you to schedule an appointment.     Return in about 6 months (around 01/29/2024) for follow up.   Health Maintenance, Male Adopting a healthy lifestyle and getting preventive care are important in promoting health and wellness. Ask your health care provider about: The right schedule for you to have regular tests and exams. Things you can do on your own to prevent diseases and keep yourself healthy. What should I know about diet, weight, and exercise? Eat a healthy diet  Eat a diet that includes plenty of vegetables, fruits, low-fat dairy products, and lean protein. Do not eat a lot of foods that are high in solid fats, added sugars, or sodium. Maintain a healthy weight Body mass index (BMI) is a measurement that can be used to identify possible weight problems. It estimates body fat based on height and weight. Your health care provider can help determine your BMI and help you achieve or maintain a healthy weight. Get regular exercise Get regular exercise. This is one of the most important things you can do for your health. Most adults should: Exercise for at least 150 minutes each week. The exercise should increase your heart rate and make you sweat (moderate-intensity exercise). Do strengthening exercises at least twice a week. This is in addition to the moderate-intensity exercise. Spend less time sitting. Even light physical activity can be beneficial. Watch cholesterol and blood lipids Have your blood tested for lipids and cholesterol at 75 years of age, then have this test every 5 years. You may need to have your cholesterol levels checked more often if: Your lipid or cholesterol levels are high. You are older than 75 years of age. You are at high risk for heart disease. What should I know about cancer screening? Many types of cancers can be detected  early and may often be prevented. Depending on your health history and family history, you may need to have cancer screening at various ages. This may include screening for: Colorectal cancer. Prostate cancer. Skin cancer. Lung cancer. What should I know about heart disease, diabetes, and high blood pressure? Blood pressure and heart disease High blood pressure causes heart disease and increases the risk of stroke. This is more likely to develop in people who have high blood pressure readings or are overweight. Talk with your health care provider about your target blood pressure readings. Have your blood pressure checked: Every 3-5 years if you are 45-47 years of age. Every year if you are 34 years old or older. If you are between the ages of 61 and 27 and are a current or former smoker, ask your health care provider if you should have a one-time screening for abdominal aortic aneurysm (AAA). Diabetes Have regular diabetes screenings. This checks your fasting blood sugar level. Have the screening done: Once every three years after age 65 if you are at a normal weight and have a low risk for diabetes. More often and at a younger age if you are overweight or have a high risk for diabetes. What should I know about preventing infection? Hepatitis B If you have a higher risk for hepatitis B, you should be screened for this virus. Talk with your health care provider to find out if you are at risk for hepatitis B infection. Hepatitis C Blood testing is recommended for:  Everyone born from [birth year] through 1965. Anyone with known risk factors for hepatitis C. Sexually transmitted infections (STIs) You should be screened each year for STIs, including gonorrhea and chlamydia, if: You are sexually active and are younger than 75 years of age. You are older than 75 years of age and your health care provider tells you that you are at risk for this type of infection. Your sexual activity has changed since  you were last screened, and you are at increased risk for chlamydia or gonorrhea. Ask your health care provider if you are at risk. Ask your health care provider about whether you are at high risk for HIV. Your health care provider may recommend a prescription medicine to help prevent HIV infection. If you choose to take medicine to prevent HIV, you should first get tested for HIV. You should then be tested every 3 months for as long as you are taking the medicine. Follow these instructions at home: Alcohol use Do not drink alcohol if your health care provider tells you not to drink. If you drink alcohol: Limit how much you have to 0-2 drinks a day. Know how much alcohol is in your drink. In the [place] one drink equals one 12 oz bottle of beer (355 mL), one 5 oz glass of wine (148 mL), or one 1 oz glass of hard liquor (44 mL). Lifestyle Do not use any products that contain nicotine or tobacco. These products include cigarettes, chewing tobacco, and vaping devices, such as e-cigarettes. If you need help quitting, ask your health care provider. Do not use street drugs. Do not share needles. Ask your health care provider for help if you need support or information about quitting drugs. General instructions Schedule regular health, dental, and eye exams. Stay current with your vaccines. Tell your health care provider if: You often feel depressed. You have ever been abused or do not feel safe at home. Summary Adopting a healthy lifestyle and getting preventive care are important in promoting health and wellness. Follow your health care provider's instructions about healthy diet, exercising, and getting tested or screened for diseases. Follow your health care provider's instructions on monitoring your cholesterol and blood pressure. This information is not intended to replace advice given to you by your health care provider. Make sure you discuss any questions you have with your health care  provider. Document Revised: 10/16/2020 Document Reviewed: 10/16/2020 Elsevier Patient Education  2024 [employer].

## 2023-07-31 NOTE — Progress Notes (Unsigned)
Subjective:    Patient ID: William Lewis, male    DOB: 1951/03/26, 73 y.o.   MRN: [id number]     HPI William Lewis is here for a physical exam and his chronic medical problems.   Had anaphylactic rxn last fall - ended of in ED, saw allergy - he was found to be allergic to honeybees, yellow jackets and wasps which was likely the cause.  Also incidentally found to be positive for alpha gal panel.  He denied any issues with eating those foods. ? Clinical significance.    Medications and allergies reviewed with patient and updated if appropriate.  Current Outpatient Medications on File Prior to Visit  Medication Sig Dispense Refill   aspirin 81 MG tablet Take 81 mg by mouth daily.     Cholecalciferol (VITAMIN D3) 2000 UNITS TABS Take by mouth daily.     EPINEPHrine 0.3 mg/0.3 mL IJ SOAJ injection Inject 0.3 mg into the muscle as needed for anaphylaxis. 2 each 0   fish oil-omega-3 fatty acids 1000 MG capsule Take by mouth daily.     fluconazole (DIFLUCAN) 100 MG tablet Take 2 tablets by mouth first day, then one tablet by mouth daily for 2 weeks 16 tablet 1   metoprolol succinate (TOPROL-XL) 25 MG 24 hr tablet Take 1/2 tablet (12.5mg  total) by mouth twice a day 90 tablet 3   metroNIDAZOLE (METROCREAM) 0.75 % cream Apply a small amount around mouth twice a day until clear 45 g 1   nystatin-triamcinolone ointment (MYCOLOG) Apply a small amount to skin on corner of mouth twice daily 30 g 1   rosuvastatin (CRESTOR) 20 MG tablet Take 1 tablet (20 mg total) by mouth at bedtime. 90 tablet 3   No current facility-administered medications on file prior to visit.    Review of Systems     Objective:  There were no vitals filed for this visit. There were no vitals filed for this visit. There is no height or weight on file to calculate BMI.  BP Readings from Last 3 Encounters:  05/27/23 126/84  04/01/23 130/82  02/11/23 132/68    Wt Readings from Last 3 Encounters:  05/27/23 199 lb 9.6 oz  (90.5 kg)  02/11/23 201 lb (91.2 kg)  02/09/23 199 lb (90.3 kg)      Physical Exam Constitutional: He appears well-developed and well-nourished. No distress.  HENT:  Head: Normocephalic and atraumatic.  Right Ear: External ear normal.  Left Ear: External ear normal.  Normal ear canals and TM b/l  Mouth/Throat: Oropharynx is clear and moist. Eyes: Conjunctivae and EOM are normal.  Neck: Neck supple. No tracheal deviation present. No thyromegaly present.  No carotid bruit  Cardiovascular: Normal rate, regular rhythm, normal heart sounds and intact distal pulses.   No murmur heard.  No lower extremity edema. Pulmonary/Chest: Effort normal and breath sounds normal. No respiratory distress. He has no wheezes. He has no rales.  Abdominal: Soft. He exhibits no distension. There is no tenderness.  Genitourinary: deferred  Lymphadenopathy:   He has no cervical adenopathy.  Skin: Skin is warm and dry. He is not diaphoretic.  Psychiatric: He has a normal mood and affect. His behavior is normal.         Assessment & Plan:   Physical exam: Screening blood work  ordered Exercise    Weight   Substance abuse   none   Reviewed recommended immunizations.   Health Maintenance  Topic Date Due   Medicare Annual Wellness (  AWV)  06/15/2022   COVID-19 Vaccine (8 - 2024-25 season) 04/07/2023   DTaP/Tdap/Td (3 - Td or Tdap) 10/27/2028   Pneumonia Vaccine 25+ Years old  Completed   INFLUENZA VACCINE  Completed   Hepatitis C Screening  Completed   Zoster Vaccines- Shingrix  Completed   HPV VACCINES  Aged Out   Colonoscopy  Discontinued     See Problem List for Assessment and Plan of chronic medical problems.

## 2023-08-01 ENCOUNTER — Ambulatory Visit (INDEPENDENT_AMBULATORY_CARE_PROVIDER_SITE_OTHER): Payer: PPO | Admitting: Internal Medicine

## 2023-08-01 ENCOUNTER — Encounter: Payer: Self-pay | Admitting: Internal Medicine

## 2023-08-01 VITALS — BP 130/74 | HR 78 | Temp 98.0°F | Ht 72.0 in | Wt 198.0 lb

## 2023-08-01 DIAGNOSIS — R7303 Prediabetes: Secondary | ICD-10-CM

## 2023-08-01 DIAGNOSIS — E782 Mixed hyperlipidemia: Secondary | ICD-10-CM

## 2023-08-01 DIAGNOSIS — E559 Vitamin D deficiency, unspecified: Secondary | ICD-10-CM

## 2023-08-01 DIAGNOSIS — I10 Essential (primary) hypertension: Secondary | ICD-10-CM

## 2023-08-01 DIAGNOSIS — Z91030 Bee allergy status: Secondary | ICD-10-CM

## 2023-08-01 DIAGNOSIS — E038 Other specified hypothyroidism: Secondary | ICD-10-CM | POA: Diagnosis not present

## 2023-08-01 DIAGNOSIS — Z8546 Personal history of malignant neoplasm of prostate: Secondary | ICD-10-CM | POA: Diagnosis not present

## 2023-08-01 NOTE — Assessment & Plan Note (Signed)
Chronic Lab Results  Component Value Date   HGBA1C 6.4 07/29/2023   Continue low sugar/carbohydrate diet Continue regular exercise

## 2023-08-01 NOTE — Assessment & Plan Note (Signed)
Chronic Continue regular exercise and healthy diet  Lab Results  Component Value Date   LDLCALC 62 07/29/2023   LDL well-controlled-62 Continue Crestor 20 mg daily

## 2023-08-01 NOTE — Assessment & Plan Note (Signed)
Chronic Lab Results  Component Value Date   TSH 4.25 07/29/2023   Tsh in normal range No medication needed - monitor

## 2023-08-01 NOTE — Assessment & Plan Note (Signed)
 Has epi pen.

## 2023-08-01 NOTE — Assessment & Plan Note (Signed)
Chronic recent PSA wnl - 0.01

## 2023-08-01 NOTE — Assessment & Plan Note (Addendum)
Chronic Blood pressure well controlled CMP, cbc normal Continue metoprolol XL 12.5 mg twice daily

## 2023-08-01 NOTE — Assessment & Plan Note (Signed)
Chronic He is taking vitamin D supplementation Vitamin D level within normal range  Last vitamin D Lab Results  Component Value Date   VD25OH 30.80 07/29/2023

## 2023-09-02 DIAGNOSIS — D485 Neoplasm of uncertain behavior of skin: Secondary | ICD-10-CM | POA: Diagnosis not present

## 2023-09-02 DIAGNOSIS — L814 Other melanin hyperpigmentation: Secondary | ICD-10-CM | POA: Diagnosis not present

## 2023-09-02 DIAGNOSIS — L853 Xerosis cutis: Secondary | ICD-10-CM | POA: Diagnosis not present

## 2023-09-02 DIAGNOSIS — D2261 Melanocytic nevi of right upper limb, including shoulder: Secondary | ICD-10-CM | POA: Diagnosis not present

## 2023-09-02 DIAGNOSIS — D2239 Melanocytic nevi of other parts of face: Secondary | ICD-10-CM | POA: Diagnosis not present

## 2023-09-02 DIAGNOSIS — D2271 Melanocytic nevi of right lower limb, including hip: Secondary | ICD-10-CM | POA: Diagnosis not present

## 2023-09-02 DIAGNOSIS — C44319 Basal cell carcinoma of skin of other parts of face: Secondary | ICD-10-CM | POA: Diagnosis not present

## 2023-09-02 DIAGNOSIS — D1801 Hemangioma of skin and subcutaneous tissue: Secondary | ICD-10-CM | POA: Diagnosis not present

## 2023-09-02 DIAGNOSIS — Z85828 Personal history of other malignant neoplasm of skin: Secondary | ICD-10-CM | POA: Diagnosis not present

## 2023-09-02 DIAGNOSIS — L570 Actinic keratosis: Secondary | ICD-10-CM | POA: Diagnosis not present

## 2023-09-02 DIAGNOSIS — L821 Other seborrheic keratosis: Secondary | ICD-10-CM | POA: Diagnosis not present

## 2023-09-02 DIAGNOSIS — I788 Other diseases of capillaries: Secondary | ICD-10-CM | POA: Diagnosis not present

## 2023-09-08 ENCOUNTER — Other Ambulatory Visit (HOSPITAL_COMMUNITY): Payer: Self-pay

## 2023-09-08 MED ORDER — CEPHALEXIN 500 MG PO CAPS
ORAL_CAPSULE | ORAL | 0 refills | Status: DC
  Filled 2023-09-08: qty 19, 3d supply, fill #0

## 2023-09-30 ENCOUNTER — Encounter: Payer: Self-pay | Admitting: Allergy & Immunology

## 2023-09-30 ENCOUNTER — Ambulatory Visit: Payer: PPO | Admitting: Allergy & Immunology

## 2023-09-30 ENCOUNTER — Other Ambulatory Visit: Payer: Self-pay

## 2023-09-30 VITALS — BP 138/84 | HR 56 | Temp 97.8°F | Resp 12

## 2023-09-30 DIAGNOSIS — R220 Localized swelling, mass and lump, head: Secondary | ICD-10-CM

## 2023-09-30 DIAGNOSIS — R221 Localized swelling, mass and lump, neck: Secondary | ICD-10-CM | POA: Diagnosis not present

## 2023-09-30 DIAGNOSIS — C61 Malignant neoplasm of prostate: Secondary | ICD-10-CM

## 2023-09-30 DIAGNOSIS — I340 Nonrheumatic mitral (valve) insufficiency: Secondary | ICD-10-CM | POA: Diagnosis not present

## 2023-09-30 DIAGNOSIS — T63481D Toxic effect of venom of other arthropod, accidental (unintentional), subsequent encounter: Secondary | ICD-10-CM | POA: Diagnosis not present

## 2023-09-30 NOTE — Progress Notes (Signed)
 FOLLOW UP  Date of Service/Encounter:  09/30/23   Assessment:   Swelling of lip, tongue, and throat - with testing that was positive to honeybee, yellow jackets, and wasp for a stinging insect allergy    Positive alpha gal panel - with unclear clinical significance (tolerance)   Active biker, tennis player, and golfer as well as swimmer - planning for a bike ride from [place] to [place]   History of prostate cancer status post resection as well as mitral valve regurgitation status post improvement    Plan/Recommendations:   1. Stinging insect allergy  (honeybee, yellowjacket, wasp) - I think it is fine to hold off on the venom shots. - EpiPen  is up to date.  - I think that the alpha gal was a red herring.  2. Return in about 1 year (around 09/29/2024).   Subjective:   William Lewis is a 73 y.o. male presenting today for follow up of  Chief Complaint  Patient presents with   insect allergy    Angioedema    William Lewis has a history of the following: Patient Active Problem List   Diagnosis Date Noted   Allergy  to bee sting 08/01/2023   Abnormal CT scan 02/07/2023   Strain of hamstring muscle, subsequent encounter 06/01/2020   Prediabetes 05/29/2020   Subclinical hypothyroidism 05/29/2020   Internal hemorrhoids 04/01/2018   Schatzki's ring 10/17/2017   S/P MVR (mitral valve repair),2007 along with ASD repair 04/26/2013   History of adenomatous polyp of colon 04/14/2013   Vitamin D  deficiency 03/30/2012   Hypertension 06/29/2010   Hyperlipidemia 10/21/2007   MITRAL REGURGITATION 10/21/2007   HX, PERSONAL, MALIGNANCY, PROSTATE 10/23/2006    History obtained from: chart review and patient.  Discussed the use of AI scribe software for clinical note transcription with the patient and/or guardian, who gave verbal consent to proceed.  William Lewis is a 73 y.o. male presenting for a follow up visit.  He was last seen in October 2024.  At that time, we provided the CPT  codes for venom immunotherapy.  We made sure his EpiPen  was up-to-date.  We recommended that he ignore the alpha gal panel since it did not seem to make clinical sense with his history.  Since last visit, he has done very well.  He has not experienced any recent insect stings or close calls since his last visit to the emergency room. He remains vigilant about his surroundings during outdoor activities to prevent future incidents.  However, he has not changed his activity level due to the reaction to stinging insects.  He continues to be an avid outdoorsman, including quite a bit of bike riding.  He consumes meat, including chicken, lamb, or steak, about once a week, and primarily eats fish or shellfish four to five nights a week. Despite previous concerns about a possible meat allergy , he has not experienced any adverse reactions or symptoms such as stomach pain.  He inquires about the expiration of his EpiPen , which is dated April 2026. He has had it for a year. He was not aware that it would last longer than one year.   Prostate cancer is stable.  He continues to follow with cardiology for his mitral valve repair.  It was performed in November 2007.  His last visit was in December 2024.  He had an EKG that at that time that showed normal sinus rhythm with a rare premature atrial contraction.  He remains active, engaging in outdoor activities such as gardening, biking, swimming, and  playing tennis and golf. He typically bikes on trails and tries to ride with others for safety. He recounts a past biking trip along the [employer] and plans a future biking adventure on [place] from [place] to [place].     Otherwise, there have been no changes to his past medical history, surgical history, family history, or social history.    Review of systems otherwise negative other than that mentioned in the HPI.    Objective:   Blood pressure 138/84, pulse (!) 56, temperature 97.8 F (36.6  C), temperature source Temporal, resp. rate 12, SpO2 96%. There is no height or weight on file to calculate BMI.    Physical Exam Vitals reviewed.  Constitutional:      Appearance: He is well-developed.     Comments: Pleasant.  HENT:     Head: Normocephalic and atraumatic.     Right Ear: Tympanic membrane, ear canal and external ear normal. No drainage, swelling or tenderness. Tympanic membrane is not injected, scarred, erythematous, retracted or bulging.     Left Ear: Tympanic membrane, ear canal and external ear normal. No drainage, swelling or tenderness. Tympanic membrane is not injected, scarred, erythematous, retracted or bulging.     Nose: No nasal deformity, septal deviation, mucosal edema or rhinorrhea.     Right Turbinates: Not enlarged, swollen or pale.     Left Turbinates: Not enlarged, swollen or pale.     Right Sinus: No maxillary sinus tenderness or frontal sinus tenderness.     Left Sinus: No maxillary sinus tenderness or frontal sinus tenderness.     Mouth/Throat:     Mouth: Mucous membranes are not pale and not dry.     Pharynx: Uvula midline.  Eyes:     General:        Right eye: No discharge.        Left eye: No discharge.     Conjunctiva/sclera: Conjunctivae normal.     Right eye: Right conjunctiva is not injected. No chemosis.    Left eye: Left conjunctiva is not injected. No chemosis.    Pupils: Pupils are equal, round, and reactive to light.  Cardiovascular:     Rate and Rhythm: Normal rate and regular rhythm.     Heart sounds: Normal heart sounds.  Pulmonary:     Effort: Pulmonary effort is normal. No tachypnea, accessory muscle usage or respiratory distress.     Breath sounds: Normal breath sounds. No wheezing, rhonchi or rales.     Comments: Moving air well in all lung fields. No increased work of breathing noted.  Chest:     Chest wall: No tenderness.  Abdominal:     Tenderness: There is no abdominal tenderness. There is no guarding or rebound.   Lymphadenopathy:     Head:     Right side of head: No submandibular, tonsillar or occipital adenopathy.     Left side of head: No submandibular, tonsillar or occipital adenopathy.     Cervical: No cervical adenopathy.  Skin:    General: Skin is warm.     Capillary Refill: Capillary refill takes less than 2 seconds.     Coloration: Skin is not pale.     Findings: No abrasion, erythema, petechiae or rash. Rash is not papular, urticarial or vesicular.     Comments: No eczematous or urticarial lesions noted.  Neurological:     Mental Status: He is alert.  Psychiatric:        Behavior: Behavior is cooperative.  Diagnostic studies: none      Drexel Gentles, MD  [facility] 

## 2023-09-30 NOTE — Patient Instructions (Addendum)
 1. Stinging insect allergy  (honeybee, yellowjacket, wasp) - I think it is fine to hold off on the venom shots. - EpiPen  is up to date.  - I think that the alpha gal was a red herring.  2. Return in about 1 year (around 09/29/2024).    Please inform us  of any Emergency Department visits, hospitalizations, or changes in symptoms. Call us  before going to the ED for breathing or allergy  symptoms since we might be able to fit you in for a sick visit. Feel free to contact us  anytime with any questions, problems, or concerns.  It was a pleasure to see you again today!  Websites that have reliable patient information: 1. American Academy of Asthma, Allergy , and Immunology: www.aaaai.org 2. Food Allergy  Research and Education (FARE): foodallergy.org 3. Mothers of Asthmatics: http://www.asthmacommunitynetwork.org 4. American College of Allergy , Asthma, and Immunology: www.acaai.org      "Like" us  on Facebook and Instagram for our latest updates!      A healthy democracy works best when [employer] participate! Make sure you are registered to vote! If you have moved or changed any of your contact information, you will need to get this updated before voting! Scan the QR codes below to learn more!

## 2023-10-08 DIAGNOSIS — C44319 Basal cell carcinoma of skin of other parts of face: Secondary | ICD-10-CM | POA: Diagnosis not present

## 2023-10-08 DIAGNOSIS — Z85828 Personal history of other malignant neoplasm of skin: Secondary | ICD-10-CM | POA: Diagnosis not present

## 2023-10-13 ENCOUNTER — Ambulatory Visit (INDEPENDENT_AMBULATORY_CARE_PROVIDER_SITE_OTHER): Admitting: Internal Medicine

## 2023-10-13 ENCOUNTER — Encounter: Payer: Self-pay | Admitting: Internal Medicine

## 2023-10-13 VITALS — BP 134/80 | HR 72 | Temp 98.4°F | Ht 72.0 in | Wt 198.0 lb

## 2023-10-13 DIAGNOSIS — N5089 Other specified disorders of the male genital organs: Secondary | ICD-10-CM | POA: Diagnosis not present

## 2023-10-13 NOTE — Progress Notes (Signed)
    Subjective:    Patient ID: William Lewis, male    DOB: 19-Jul-1950, 73 y.o.   MRN: [id number]      HPI William Lewis is here for  Chief Complaint  Patient presents with   Mass    Lump located on right side of scrotum (no pain)     Lump in scrotum - noticed it recently.  He is not sure how long it has been there, feels like it could have been there for a while and he may not know.  He denies any pain.  He states it is in the right scrotum next to the testicle.      Medications and allergies reviewed with patient and updated if appropriate.  Current Outpatient Medications on File Prior to Visit  Medication Sig Dispense Refill   aspirin 81 MG tablet Take 81 mg by mouth daily.     Cholecalciferol (VITAMIN D3) 2000 UNITS TABS Take by mouth daily.     EPINEPHrine  0.3 mg/0.3 mL IJ SOAJ injection Inject 0.3 mg into the muscle as needed for anaphylaxis. 2 each 0   fish oil-omega-3 fatty acids 1000 MG capsule Take by mouth daily.     metoprolol  succinate (TOPROL -XL) 25 MG 24 hr tablet Take 1/2 tablet (12.5mg  total) by mouth twice a day 90 tablet 3   rosuvastatin  (CRESTOR ) 20 MG tablet Take 1 tablet (20 mg total) by mouth at bedtime. 90 tablet 3   No current facility-administered medications on file prior to visit.    Review of Systems     Objective:   Vitals:   10/13/23 1555  BP: 134/80  Pulse: 72  Temp: 98.4 F (36.9 C)  SpO2: 98%   BP Readings from Last 3 Encounters:  10/13/23 134/80  09/30/23 138/84  08/01/23 130/74   Wt Readings from Last 3 Encounters:  10/13/23 198 lb (89.8 kg)  08/01/23 198 lb (89.8 kg)  05/27/23 199 lb 9.6 oz (90.5 kg)   Body mass index is 26.85 kg/m.    Physical Exam Constitutional:      General: He is not in acute distress.    Appearance: Normal appearance. He is not ill-appearing.  Genitourinary:    Comments: Deferred Skin:    General: Skin is warm and dry.  Neurological:     Mental Status: He is alert.             Assessment & Plan:    See Problem List for Assessment and Plan of chronic medical problems.

## 2023-10-13 NOTE — Patient Instructions (Signed)
       A referral was ordered for [facility] and someone will call you to schedule an appointment.

## 2023-10-13 NOTE — Assessment & Plan Note (Signed)
 Acute Exam deferred since we will be referring him to urology Probable hydrocele versus spermatocele Referral ordered for urology If anything changes before he gets to see them he will let me know

## 2023-10-14 ENCOUNTER — Ambulatory Visit: Admitting: Internal Medicine

## 2023-11-04 ENCOUNTER — Encounter: Payer: Self-pay | Admitting: Internal Medicine

## 2023-11-05 ENCOUNTER — Other Ambulatory Visit (HOSPITAL_COMMUNITY): Payer: Self-pay

## 2023-11-05 DIAGNOSIS — H60331 Swimmer's ear, right ear: Secondary | ICD-10-CM | POA: Diagnosis not present

## 2023-11-05 MED ORDER — NEOMYCIN-POLYMYXIN-HC 3.5-10000-1 OT SUSP
3.0000 [drp] | Freq: Two times a day (BID) | OTIC | 0 refills | Status: DC
  Filled 2023-11-05: qty 10, 30d supply, fill #0

## 2023-11-06 ENCOUNTER — Other Ambulatory Visit (HOSPITAL_COMMUNITY): Payer: Self-pay

## 2023-11-06 ENCOUNTER — Ambulatory Visit: Payer: Self-pay

## 2023-11-06 NOTE — Telephone Encounter (Signed)
 Copied from CRM [id number]. Topic: Clinical - Red Word Triage >> Nov 06, 2023  8:17 AM Freya Jesus wrote: Red Word that prompted transfer to Nurse Triage: Swollen right ear, possible ear infection. Went to urgent care yesterday and they give him drops but he was not able to pick them up.   Chief Complaint: Swimmers ear Symptoms: ear canal blocked, decrease hearing, and liquid discharge Frequency: constant Pertinent Negatives: Patient denies fever Disposition: [] ED /[] Urgent Care (no appt availability in office) / [] Appointment(In office/virtual)/ []  [facility]/ [] Home Care/ [] Refused Recommended Disposition /[x] [facility]/ []  Follow-up with PCP Additional Notes: Seen at [facility] yesterday and given ear drops, but would like a second opinion and possibly get started on oral abx. Also the drops prescribed are not covered by insurance. Pt requesting to be seen today since he will be out of town, no avail appts. RN advising [facility] located at [facility] today, pt is agreeable.  Reason for Disposition  Yellow or green discharge from ear canal  Answer Assessment - Initial Assessment Questions 1. LOCATION: "Which ear is involved?"      Right ear  2. SYMPTOMS: "What are the main symptoms?" (e.g., pain, redness, itching, discharge)     Swelling (internally), pain, discharge  3. MOVEMENT: "Does the pain increase when the ear is moved up and down?" Does pushing on the tab of tissue in the front of the ear increase the pain?"      No  4. PAIN: "How bad is the pain?"  (Scale 1-10; mild, moderate or severe)   - MILD (1-3): doesn't interfere with normal activities    - MODERATE (4-7): interferes with normal activities or awakens from sleep    - SEVERE (8-10): excruciating pain, unable to do any normal activities      Moderate  5. ONSET: "When did the ear symptoms start?"      Started 1 week ago  6. DISCHARGE: "Is there any discharge? What color is it?"      "Liquidy" ear  wax   7. SWIMMING: "Have you been swimming recently?" If Yes, ask: "How often do you swim? Is it in a pool, lake or ocean?"      Yes  8. COTTON EAR SWABS: "Do you use cotton ear swabs (Q-tips)? How often?" (e.g., never, daily, weekly)     No  Protocols used: Ear - Swimmer's-A-AH

## 2023-11-13 ENCOUNTER — Encounter: Payer: Self-pay | Admitting: Internal Medicine

## 2023-11-16 ENCOUNTER — Encounter: Payer: Self-pay | Admitting: Internal Medicine

## 2023-11-16 DIAGNOSIS — H938X1 Other specified disorders of right ear: Secondary | ICD-10-CM | POA: Insufficient documentation

## 2023-11-16 NOTE — Progress Notes (Unsigned)
    Subjective:    Patient ID: William Lewis, male    DOB: [birth year], 73 y.o.   MRN: [id number]      HPI Normal is here for No chief complaint on file.    Right ear fullness-    Medications and allergies reviewed with patient and updated if appropriate.  Current Outpatient Medications on File Prior to Visit  Medication Sig Dispense Refill   aspirin 81 MG tablet Take 81 mg by mouth daily.     Cholecalciferol (VITAMIN D3) 2000 UNITS TABS Take by mouth daily.     EPINEPHrine  0.3 mg/0.3 mL IJ SOAJ injection Inject 0.3 mg into the muscle as needed for anaphylaxis. 2 each 0   fish oil-omega-3 fatty acids 1000 MG capsule Take by mouth daily.     metoprolol  succinate (TOPROL -XL) 25 MG 24 hr tablet Take 1/2 tablet (12.5mg  total) by mouth twice a day 90 tablet 3   neomycin -polymyxin-hydrocortisone (CORTISPORIN ) 3.5-10000-1 OTIC suspension Place 3 drops into the right ear 2 (two) times daily. 10 mL 0   rosuvastatin  (CRESTOR ) 20 MG tablet Take 1 tablet (20 mg total) by mouth at bedtime. 90 tablet 3   No current facility-administered medications on file prior to visit.    Review of Systems     Objective:  There were no vitals filed for this visit. BP Readings from Last 3 Encounters:  10/13/23 134/80  09/30/23 138/84  08/01/23 130/74   Wt Readings from Last 3 Encounters:  10/13/23 198 lb (89.8 kg)  08/01/23 198 lb (89.8 kg)  05/27/23 199 lb 9.6 oz (90.5 kg)   There is no height or weight on file to calculate BMI.    Physical Exam         Assessment & Plan:    See Problem List for Assessment and Plan of chronic medical problems.

## 2023-11-16 NOTE — Patient Instructions (Incomplete)
     Medications changes include :   None    A referral was ordered for [facility] and someone will call you to schedule an appointment.    [facility] [address] [place] [address]  [phone]

## 2023-11-17 ENCOUNTER — Encounter: Payer: Self-pay | Admitting: Internal Medicine

## 2023-11-17 ENCOUNTER — Ambulatory Visit (INDEPENDENT_AMBULATORY_CARE_PROVIDER_SITE_OTHER): Admitting: Internal Medicine

## 2023-11-17 VITALS — BP 136/74 | HR 72 | Temp 98.4°F | Ht 72.0 in | Wt 198.0 lb

## 2023-11-17 DIAGNOSIS — I10 Essential (primary) hypertension: Secondary | ICD-10-CM | POA: Diagnosis not present

## 2023-11-17 DIAGNOSIS — H938X1 Other specified disorders of right ear: Secondary | ICD-10-CM

## 2023-11-17 DIAGNOSIS — H60391 Other infective otitis externa, right ear: Secondary | ICD-10-CM

## 2023-11-17 NOTE — Assessment & Plan Note (Signed)
 Acute He does swim He did have some fullness sensation in his ear and tried some over-the-counter eardrops which did not seem to help, he did go to urgent care and was given prescription eardrops there was not much improvement Evidence of persistent otitis externa-concern for fungal infection Associated hearing loss Referral to ENT

## 2023-11-17 NOTE — Assessment & Plan Note (Signed)
 Chronic Blood pressure initially elevated-better on repeat Advised monitoring BP at home-discussed BP goals Continue metoprolol  XL 12.5 mg twice daily-can increase metoprolol  if BP is persistently elevated

## 2023-12-10 DIAGNOSIS — N451 Epididymitis: Secondary | ICD-10-CM | POA: Diagnosis not present

## 2023-12-10 DIAGNOSIS — N5201 Erectile dysfunction due to arterial insufficiency: Secondary | ICD-10-CM | POA: Diagnosis not present

## 2023-12-17 ENCOUNTER — Ambulatory Visit (INDEPENDENT_AMBULATORY_CARE_PROVIDER_SITE_OTHER): Admitting: Physician Assistant

## 2023-12-17 ENCOUNTER — Encounter (INDEPENDENT_AMBULATORY_CARE_PROVIDER_SITE_OTHER): Payer: Self-pay | Admitting: Physician Assistant

## 2023-12-17 ENCOUNTER — Ambulatory Visit (INDEPENDENT_AMBULATORY_CARE_PROVIDER_SITE_OTHER): Admitting: Audiology

## 2023-12-17 VITALS — BP 162/98 | HR 67

## 2023-12-17 DIAGNOSIS — H903 Sensorineural hearing loss, bilateral: Secondary | ICD-10-CM | POA: Diagnosis not present

## 2023-12-17 DIAGNOSIS — Z09 Encounter for follow-up examination after completed treatment for conditions other than malignant neoplasm: Secondary | ICD-10-CM

## 2023-12-17 DIAGNOSIS — Z8669 Personal history of other diseases of the nervous system and sense organs: Secondary | ICD-10-CM | POA: Diagnosis not present

## 2023-12-17 DIAGNOSIS — H60331 Swimmer's ear, right ear: Secondary | ICD-10-CM

## 2023-12-17 NOTE — Progress Notes (Signed)
 Dear Dr. Geofm, Here is my assessment for our mutual patient, William Lewis. Thank you for allowing me the opportunity to care for your patient. Please do not hesitate to contact me should you have any other questions. Sincerely, William Cohen PA-C  Otolaryngology Clinic Note Referring provider: Dr. Geofm HPI:  William Lewis is a 73 y.o. male kindly referred by Dr. Geofm   The patient is a 73 year old gentleman presenting today for evaluation of otitis externa.  The patient notes that he is an avid swimmer, he has had occasional episodes of otitis externa that were treated with otic drops.  He notes that in June he did develop a another right sided episode of otitis externa.  He was placed on cortisporin  drops which seem to alleviate his symptoms.  He denies any significant hearing changes.  He denies any significant pain.  No history of head or neck surgery.  He is not diabetic   Independent Review of Additional Tests or Records:  Audiological evaluation on 12/17/2023    Symmetric sensorineural hearing loss, tympanometry shows right As, left A PMH/Meds/All/SocHx/FamHx/ROS:   Past Medical History:  Diagnosis Date   ASD (atrial septal defect) 06/10/2005   repaired with PFO in [place]   Cancer [facility])    Echocardiogram abnormal 06/11/2011   EF 50-55%, normal diastolic parameters, mild mitral regur. with his post leaflet of MV calcified   History of stress test 06/10/2006   neg.for ischemia   HTN (hypertension)    MVP (mitral valve prolapse) 06/10/2005   mitral valve repair in [place]   Other and unspecified hyperlipidemia    Dr Charlena Sor     Past Surgical History:  Procedure Laterality Date   CARDIAC CATHETERIZATION  2007   EF 65-70%, significant mitral prolapse wth 4+ MR, normal coronary arteries    COLONOSCOPY  2005 ; 2010;2014   polyp 2010 , Dr Luis   HERNIA REPAIR  2007   I & D EXTREMITY     thumb, for infection   MITRAL VALVE REPAIR  2007   Robotic surgery @ [facility] ,  [place] and ASD repair and PFO repair   PROSTATE SURGERY  1995   TURP , Dr Mardy    Family History  Problem Relation Age of Onset   Cancer Mother 41       ? intra-abdominal   Esophageal cancer Father    Hypertension Father    Cancer Father        throat ; smoker   Prostate cancer Father    Colon cancer Sister    AAA (abdominal aortic aneurysm) Maternal Grandmother    Stroke Paternal Grandmother        late 8s   Heart disease Neg Hx    Diabetes Neg Hx    Colon polyps Neg Hx    Rectal cancer Neg Hx    Stomach cancer Neg Hx      Social Connections: Socially Integrated (07/28/2023)   Social Connection and Isolation Panel    Frequency of Communication with Friends and Family: More than three times a week    Frequency of Social Gatherings with Friends and Family: Once a week    Attends Religious Services: More than 4 times per year    Active Member of [employer] or Organizations: Yes    Attends Engineer, structural: More than 4 times per year    Marital Status: Living with partner      Current Outpatient Medications:    aspirin 81 MG tablet,  Take 81 mg by mouth daily., Disp: , Rfl:    Cholecalciferol (VITAMIN D3) 2000 UNITS TABS, Take by mouth daily., Disp: , Rfl:    EPINEPHrine  0.3 mg/0.3 mL IJ SOAJ injection, Inject 0.3 mg into the muscle as needed for anaphylaxis., Disp: 2 each, Rfl: 0   fish oil-omega-3 fatty acids 1000 MG capsule, Take by mouth daily., Disp: , Rfl:    metoprolol  succinate (TOPROL -XL) 25 MG 24 hr tablet, Take 1/2 tablet (12.5mg  total) by mouth twice a day, Disp: 90 tablet, Rfl: 3   neomycin -polymyxin-hydrocortisone (CORTISPORIN ) 3.5-10000-1 OTIC suspension, Place 3 drops into the right ear 2 (two) times daily., Disp: 10 mL, Rfl: 0   rosuvastatin  (CRESTOR ) 20 MG tablet, Take 1 tablet (20 mg total) by mouth at bedtime., Disp: 90 tablet, Rfl: 3   Physical Exam:   There were no vitals taken for this visit.  Pertinent Findings  CN II-XII  intact Bilateral external auditory canals with minimal cerumen, TMs intact with well-pneumatized middle ear space, EACs with no signs of infection Anterior rhinoscopy: Septum midline No lesions of oral cavity/oropharynx; dentition within normal limits No obviously palpable neck masses/lymphadenopathy/thyromegaly No respiratory distress or stridor  Seprately Identifiable Procedures:  None  Impression & Plans:  William Lewis is a 73 y.o. male with the following   Otitis externa-  No findings of otitis externa on exam today it seems that the otic drops have successfully treated this.  I would recommend the patient to return in 6 months for repeat evaluation and cerumen removal as he does have a history of cerumen impaction that may be contributing to his episodes of otitis externa.  He will continue using water protection when swimming.  He may follow-up sooner as needed.  The patient verbalized understanding and agreement to today's plan had no further questions or concerns.   - f/u 6 months for cerumen removal   Thank you for allowing me the opportunity to care for your patient. Please do not hesitate to contact me should you have any other questions.  Sincerely, William Cohen PA-C [facility] Phone: [phone] Fax: [phone]  12/17/2023, 10:37 AM

## 2023-12-18 NOTE — Progress Notes (Signed)
  [address] [place], [place] [address] [phone]  Audiological Evaluation    Name: William Lewis     DOB:   [birth year]      MRN:   [id number]                                                                                     Service Date: 12/17/2023     Accompanied by: unaccompanied   Patient comes today after Reyes Cohen, PA-C sent a referral for a hearing evaluation due to concerns with otitis externa.   Symptoms Yes Details  Hearing loss  [x]  Reports cannot hear well when his external ear canal was inflamed   Tinnitus  []    Ear pain/ infections/pressure  [x]  Left otitis externa, treated with drops  Balance problems  []    Noise exposure history  []    Previous ear surgeries  []    Family history of hearing loss  []    Amplification  []    Other  []      Otoscopy: Right ear: Clear external ear canal and notable landmarks visualized on the tympanic membrane. Left ear:  Clear external ear canal and notable landmarks visualized on the tympanic membrane.  Tympanometry: Right ear: Type As- Normal external ear canal volume with normal middle ear pressure and low tympanic membrane compliance. Left ear: Type A- Normal external ear canal volume with normal middle ear pressure and tympanic membrane compliance.     Pure tone Audiometry: Both ears- Normal to moderately severe sensorineural hearing loss from 250 Hz - 8000 Hz.    Speech Audiometry: Right ear- Speech Reception Threshold (SRT) was obtained at 30 dBHL. Left ear-Speech Reception Threshold (SRT) was obtained at 30 dBHL.   Word Recognition Score Tested using NU-6 (recorded) Right ear: 100% was obtained at a presentation level of 65 dBHL with contralateral masking which is deemed as  excellent. Left ear: 100% was obtained at a presentation level of 65 dBHL with contralateral masking which is deemed as  excellent.   The hearing test results were completed under headphones and re-checked with inserts  and results are deemed to be of good reliability. Test technique:  conventional     Recommendations: Follow up with ENT as scheduled for today. Return for a hearing evaluation if concerns with hearing changes arise or per MD recommendation. Consider a communication needs assessment after medical clearance for hearing aids is obtained, pending patient's motivation.   Ashaunte Standley MARIE LEROUX-MARTINEZ, AUD

## 2024-01-22 ENCOUNTER — Ambulatory Visit (INDEPENDENT_AMBULATORY_CARE_PROVIDER_SITE_OTHER)

## 2024-01-22 VITALS — Ht 72.0 in | Wt 199.0 lb

## 2024-01-22 DIAGNOSIS — Z0000 Encounter for general adult medical examination without abnormal findings: Secondary | ICD-10-CM | POA: Diagnosis not present

## 2024-01-22 NOTE — Patient Instructions (Addendum)
 William Lewis , Thank you for taking time out of your busy schedule to complete your Annual Wellness Visit with me. I enjoyed our conversation and look forward to speaking with you again next year. I, as well as your care team,  appreciate your ongoing commitment to your health goals. Please review the following plan we discussed and let me know if I can assist you in the future. Your Game plan/ To Do List    Referrals: If you haven't heard from the office you've been referred to, please reach out to them at the phone provided.   Follow up Visits: We will see or speak with you next year for your Next Medicare AWV with our clinical staff Have you seen your provider in the last 6 months (3 months if uncontrolled diabetes)? No  Clinician Recommendations:  Aim for 30 minutes of exercise or brisk walking, 6-8 glasses of water, and 5 servings of fruits and vegetables each day.       This is a list of the screenings recommended for you:  Health Maintenance  Topic Date Due   COVID-19 Vaccine (8 - Pfizer risk 2024-25 season) 08/10/2023   Flu Shot  01/09/2024   Medicare Annual Wellness Visit  01/21/2025   DTaP/Tdap/Td vaccine (3 - Td or Tdap) 10/27/2028   Pneumococcal Vaccine for age over 67  Completed   Hepatitis C Screening  Completed   Zoster (Shingles) Vaccine  Completed   HPV Vaccine  Aged Out   Meningitis B Vaccine  Aged Out   Pneumococcal Vaccine  Discontinued   Colon Cancer Screening  Discontinued    Advanced directives: (Copy Requested) Please bring a copy of your health care power of attorney and living will to the office to be added to your chart at your convenience. You can mail to [facility] [address] [place], [place] [address] or email to [email or web address] Advance Care Planning is important because it:  [x]  Makes sure you receive the medical care that is consistent with your values, goals, and preferences  [x]  It provides guidance to your family and  loved ones and reduces their decisional burden about whether or not they are making the right decisions based on your wishes.  Follow the link provided in your after visit summary or read over the paperwork we have mailed to you to help you started getting your Advance Directives in place. If you need assistance in completing these, please reach out to us  so that we can help you!

## 2024-01-22 NOTE — Progress Notes (Signed)
 Subjective:   William Lewis is a 73 y.o. who presents for a Medicare Wellness preventive visit.  As a reminder, Annual Wellness Visits don't include a physical exam, and some assessments may be limited, especially if this visit is performed virtually. We may recommend an in-person follow-up visit with your provider if needed.  Visit Complete: Virtual I connected with  William Lewis on 01/22/24 by a audio enabled telemedicine application and verified that I am speaking with the correct person using two identifiers.  Patient Location: Home  Provider Location: Office/Clinic  I discussed the limitations of evaluation and management by telemedicine. The patient expressed understanding and agreed to proceed.  Vital Signs: Because this visit was a virtual/telehealth visit, some criteria may be missing or patient reported. Any vitals not documented were not able to be obtained and vitals that have been documented are patient reported.  VideoDeclined- This patient declined Librarian, academic. Therefore the visit was completed with audio only.  Persons Participating in Visit: Patient.  AWV Questionnaire: Yes: Patient Medicare AWV questionnaire was completed by the patient on 01/18/2024; I have confirmed that all information answered by patient is correct and no changes since this date.  Cardiac Risk Factors include: advanced age (>40men, >43 women);dyslipidemia;male gender;hypertension     Objective:    Today's Vitals   01/22/24 1111  Weight: 199 lb (90.3 kg)  Height: 6' (1.829 m)   Body mass index is 26.99 kg/m.     01/22/2024   11:12 AM 06/15/2021    1:03 PM 06/25/2016    9:13 AM 12/08/2015    1:10 PM  Advanced Directives  Does Patient Have a Medical Advance Directive? Yes Yes No  No   Type of Estate agent of [employer];Living will Living will;Healthcare Power of Attorney    Does patient want to make changes to medical advance  directive?  No - Patient declined    Copy of Healthcare Power of Attorney in Chart? No - copy requested No - copy requested       Data saved with a previous flowsheet row definition    Current Medications (verified) Outpatient Encounter Medications as of 01/22/2024  Medication Sig   aspirin 81 MG tablet Take 81 mg by mouth daily.   Cholecalciferol (VITAMIN D3) 2000 UNITS TABS Take by mouth daily.   EPINEPHrine  0.3 mg/0.3 mL IJ SOAJ injection Inject 0.3 mg into the muscle as needed for anaphylaxis.   fish oil-omega-3 fatty acids 1000 MG capsule Take by mouth daily.   metoprolol  succinate (TOPROL -XL) 25 MG 24 hr tablet Take 1/2 tablet (12.5mg  total) by mouth twice a day   rosuvastatin  (CRESTOR ) 20 MG tablet Take 1 tablet (20 mg total) by mouth at bedtime.   [DISCONTINUED] neomycin -polymyxin-hydrocortisone (CORTISPORIN ) 3.5-10000-1 OTIC suspension Place 3 drops into the right ear 2 (two) times daily.   No facility-administered encounter medications on file as of 01/22/2024.    Allergies (verified) Poison ivy extract and Yellow jacket venom   History: Past Medical History:  Diagnosis Date   ASD (atrial septal defect) 06/10/2005   repaired with PFO in [place]   Cancer [facility])    Echocardiogram abnormal 06/11/2011   EF 50-55%, normal diastolic parameters, mild mitral regur. with his post leaflet of MV calcified   History of stress test 06/10/2006   neg.for ischemia   HTN (hypertension)    MVP (mitral valve prolapse) 06/10/2005   mitral valve repair in [place]   Other and unspecified hyperlipidemia  Dr Charlena Sor   Past Surgical History:  Procedure Laterality Date   CARDIAC CATHETERIZATION  2007   EF 65-70%, significant mitral prolapse wth 4+ MR, normal coronary arteries    COLONOSCOPY  2005 ; 2010;2014   polyp 2010 , Dr Luis   HERNIA REPAIR  2007   I & D EXTREMITY     thumb, for infection   MITRAL VALVE REPAIR  2007   Robotic surgery @ [facility] , [place] and ASD repair  and PFO repair   PROSTATE SURGERY  1995   TURP , Dr Mardy   Family History  Problem Relation Age of Onset   Cancer Mother 65       ? intra-abdominal   Esophageal cancer Father    Hypertension Father    Cancer Father        throat ; smoker   Prostate cancer Father    Colon cancer Sister    AAA (abdominal aortic aneurysm) Maternal Grandmother    Stroke Paternal Grandmother        late 58s   Heart disease Neg Hx    Diabetes Neg Hx    Colon polyps Neg Hx    Rectal cancer Neg Hx    Stomach cancer Neg Hx    Social History   Socioeconomic History   Marital status: Widowed    Spouse name: Not on file   Number of children: Not on file   Years of education: Not on file   Highest education level: Master's Lewis (e.g., MA, MS, MEng, MEd, MSW, MBA)  Occupational History   Not on file  Tobacco Use   Smoking status: Former    Current packs/day: 0.00    Types: Cigarettes    Quit date: 06/10/1977    Years since quitting: 46.6    Passive exposure: Past   Smokeless tobacco: Never   Tobacco comments:    from age [age], up to 1 ppd or less  Vaping Use   Vaping status: Never Used  Substance and Sexual Activity   Alcohol use: Yes    Alcohol/week: 1.0 standard drink of alcohol    Types: 1 Cans of beer per week    Comment: socially   Drug use: No   Sexual activity: Yes  Other Topics Concern   Not on file  Social History Narrative   Gets reg exercise   Social Drivers of Health   Financial Resource Strain: Low Risk  (01/22/2024)   Overall Financial Resource Strain (CARDIA)    Difficulty of Paying Living Expenses: Not hard at all  Food Insecurity: No Food Insecurity (01/22/2024)   Hunger Vital Sign    Worried About Running Out of Food in the Last Year: Never true    Ran Out of Food in the Last Year: Never true  Transportation Needs: No Transportation Needs (01/22/2024)   PRAPARE - Administrator, Civil Service (Medical): No    Lack of Transportation (Non-Medical):  No  Physical Activity: Sufficiently Active (01/22/2024)   Exercise Vital Sign    Days of Exercise per Week: 7 days    Minutes of Exercise per Session: 60 min  Stress: No Stress Concern Present (01/22/2024)   [employer] of Occupational Health - Occupational Stress Questionnaire    Feeling of Stress: Not at all  Social Connections: Moderately Integrated (01/22/2024)   Social Connection and Isolation Panel    Frequency of Communication with Friends and Family: More than three times a week    Frequency  of Social Gatherings with Friends and Family: More than three times a week    Attends Religious Services: More than 4 times per year    Active Member of [employer] or Organizations: Yes    Attends Banker Meetings: More than 4 times per year    Marital Status: Widowed    Tobacco Counseling Counseling given: No Tobacco comments: from age 53-28, up to 1 ppd or less    Clinical Intake:  Pre-visit preparation completed: Yes  Pain : No/denies pain     BMI - recorded: 26.99 Nutritional Status: BMI 25 -29 Overweight Nutritional Risks: None Diabetes: No  Lab Results  Component Value Date   HGBA1C 6.4 07/29/2023   HGBA1C 6.3 06/14/2022   HGBA1C 6.4 06/13/2021     How often do you need to have someone help you when you read instructions, pamphlets, or other written materials from your doctor or pharmacy?: 1 - Never  Interpreter Needed?: No  Information entered by :: Verdie Saba, CMA   Activities of Daily Living     01/22/2024   11:17 AM 01/18/2024   10:07 AM  In your present state of health, do you have any difficulty performing the following activities:  Hearing? 0 0  Vision? 0 0  Difficulty concentrating or making decisions? 0 0  Walking or climbing stairs? 0 0  Dressing or bathing? 0 0  Doing errands, shopping? 0 0  Preparing Food and eating ? N N  Using the Toilet? N N  In the past six months, have you accidently leaked urine? N N  Do you have  problems with loss of bowel control? N N  Managing your Medications? N N  Managing your Finances? N N  Housekeeping or managing your Housekeeping? N N    Patient Care Team: Geofm Glade PARAS, MD as PCP - General (Internal Medicine) Burnard Debby LABOR, MD (Inactive) as PCP - Cardiology (Cardiology) Kennyth Cy RAMAN, DO (Osteopathic Medicine) Kennyth Cy RAMAN, DO (Ophthalmology)  I have updated your Care Teams any recent Medical Services you may have received from other providers in the past year.     Assessment:   This is a routine wellness examination for William Lewis.  Hearing/Vision screen Hearing Screening - Comments:: Denies hearing difficulties   Vision Screening - Comments:: Wears rx glasses - up to date with routine eye exams with Elvie Kennyth   Goals Addressed               This Visit's Progress     Patient Stated (pt-stated)        Patient stated he plans to lose weight (8-9lbs_ and continue exercising and do more       Depression Screen     01/22/2024   11:18 AM 08/01/2023    2:04 PM 02/07/2023   11:15 AM 01/06/2023    3:12 PM 06/17/2022    8:26 AM 06/17/2022    8:25 AM 06/15/2021   12:50 PM  PHQ 2/9 Scores  PHQ - 2 Score 0 0 0 0 0 0 0  PHQ- 9 Score 0  0 0 0      Fall Risk     01/22/2024   11:17 AM 01/18/2024   10:07 AM 08/01/2023    2:04 PM 02/07/2023   11:15 AM 01/06/2023    3:12 PM  Fall Risk   Falls in the past year? 1 1 0 0 0  Number falls in past yr: 0 0 0 0 0  Injury with  Fall? 0 0 0 0 0  Risk for fall due to :   No Fall Risks No Fall Risks No Fall Risks  Follow up Falls evaluation completed;Falls prevention discussed  Falls prevention discussed Falls evaluation completed Falls evaluation completed    MEDICARE RISK AT HOME:  Medicare Risk at Home Any stairs in or around the home?: Yes If so, are there any without handrails?: No Home free of loose throw rugs in walkways, pet beds, electrical cords, etc?: Yes Adequate lighting in your home to reduce risk of  falls?: Yes Life alert?: No Use of a cane, walker or w/c?: No Grab bars in the bathroom?: No Shower chair or bench in shower?: No Elevated toilet seat or a handicapped toilet?: No  TIMED UP AND GO:  Was the test performed?  No  Cognitive Function: 6CIT completed        01/22/2024   11:21 AM  6CIT Screen  What Year? 0 points  What month? 0 points  What time? 0 points  Count back from 20 0 points  Months in reverse 0 points  Repeat phrase 0 points  Total Score 0 points    Immunizations Immunization History  Administered Date(s) Administered   Fluad Quad(high Dose 65+) 02/22/2019, 02/16/2020, 02/18/2022   Influenza Whole 03/02/2012, 03/01/2013   Influenza, High Dose Seasonal PF 02/18/2018   Influenza-Unspecified 02/28/2014, 02/20/2015, 03/04/2016, 03/03/2017, 02/19/2021, 02/18/2022, 01/22/2023   MMR 04/16/2019   PFIZER(Purple Top)SARS-COV-2 Vaccination 06/30/2019, 07/21/2019, 03/13/2020, 09/22/2020   Pfizer Covid-19 Vaccine Bivalent Booster 70yrs & up 03/21/2021, 02/25/2022   Pfizer(Comirnaty)Fall Seasonal Vaccine 12 years and older 02/10/2023   Pneumococcal Conjugate-13 05/16/2014   Pneumococcal Polysaccharide-23 05/19/2015   Rsv, Mab, [place], 0.5 Ml, Neonate To 24 Mos(Beyfortus) 06/12/2022   Td 09/02/2008   Tdap 10/28/2018   Zoster Recombinant(Shingrix) 08/14/2017, 10/15/2017   Zoster, Live 03/27/2011    Screening Tests Health Maintenance  Topic Date Due   COVID-19 Vaccine (8 - Pfizer risk 2024-25 season) 08/10/2023   INFLUENZA VACCINE  01/09/2024   Medicare Annual Wellness (AWV)  01/21/2025   DTaP/Tdap/Td (3 - Td or Tdap) 10/27/2028   Pneumococcal Vaccine: 50+ Years  Completed   Hepatitis C Screening  Completed   Zoster Vaccines- Shingrix  Completed   HPV VACCINES  Aged Out   Meningococcal B Vaccine  Aged Out   Pneumococcal Vaccine  Discontinued   Colonoscopy  Discontinued    Health Maintenance  Health Maintenance Due  Topic Date Due    COVID-19 Vaccine (8 - Pfizer risk 2024-25 season) 08/10/2023   INFLUENZA VACCINE  01/09/2024   Health Maintenance Items Addressed: 01/22/2024  Additional Screening:  Vision Screening: Recommended annual ophthalmology exams for early detection of glaucoma and other disorders of the eye. Would you like a referral to an eye doctor? No    Dental Screening: Recommended annual dental exams for proper oral hygiene  Community Resource Referral / Chronic Care Management: CRR required this visit?  No   CCM required this visit?  No   Plan:    I have personally reviewed and noted the following in the patient's chart:   Medical and social history Use of alcohol, tobacco or illicit drugs  Current medications and supplements including opioid prescriptions. Patient is not currently taking opioid prescriptions. Functional ability and status Nutritional status Physical activity Advanced directives List of other physicians Hospitalizations, surgeries, and ER visits in previous 12 months Vitals Screenings to include cognitive, depression, and falls Referrals and appointments  In addition, I have reviewed  and discussed with patient certain preventive protocols, quality metrics, and best practice recommendations. A written personalized care plan for preventive services as well as general preventive health recommendations were provided to patient.   Verdie CHRISTELLA Saba, CMA   01/22/2024   After Visit Summary: (MyChart) Due to this being a telephonic visit, the after visit summary with patients personalized plan was offered to patient via MyChart   Notes: Nothing significant to report at this time.

## 2024-01-29 ENCOUNTER — Encounter: Payer: Self-pay | Admitting: Internal Medicine

## 2024-01-29 ENCOUNTER — Ambulatory Visit: Payer: PPO | Admitting: Internal Medicine

## 2024-01-29 DIAGNOSIS — E782 Mixed hyperlipidemia: Secondary | ICD-10-CM

## 2024-01-29 DIAGNOSIS — Z125 Encounter for screening for malignant neoplasm of prostate: Secondary | ICD-10-CM

## 2024-01-29 DIAGNOSIS — I10 Essential (primary) hypertension: Secondary | ICD-10-CM

## 2024-01-29 DIAGNOSIS — R7303 Prediabetes: Secondary | ICD-10-CM

## 2024-01-29 DIAGNOSIS — E559 Vitamin D deficiency, unspecified: Secondary | ICD-10-CM

## 2024-01-29 DIAGNOSIS — E038 Other specified hypothyroidism: Secondary | ICD-10-CM

## 2024-02-02 ENCOUNTER — Encounter: Payer: Self-pay | Admitting: Internal Medicine

## 2024-02-02 ENCOUNTER — Other Ambulatory Visit (INDEPENDENT_AMBULATORY_CARE_PROVIDER_SITE_OTHER)

## 2024-02-02 DIAGNOSIS — Z125 Encounter for screening for malignant neoplasm of prostate: Secondary | ICD-10-CM | POA: Diagnosis not present

## 2024-02-02 DIAGNOSIS — I10 Essential (primary) hypertension: Secondary | ICD-10-CM | POA: Diagnosis not present

## 2024-02-02 DIAGNOSIS — E782 Mixed hyperlipidemia: Secondary | ICD-10-CM | POA: Diagnosis not present

## 2024-02-02 DIAGNOSIS — R7303 Prediabetes: Secondary | ICD-10-CM | POA: Diagnosis not present

## 2024-02-02 DIAGNOSIS — E038 Other specified hypothyroidism: Secondary | ICD-10-CM | POA: Diagnosis not present

## 2024-02-02 DIAGNOSIS — E559 Vitamin D deficiency, unspecified: Secondary | ICD-10-CM

## 2024-02-02 LAB — CBC WITH DIFFERENTIAL/PLATELET
Basophils Absolute: 0 K/uL (ref 0.0–0.1)
Basophils Relative: 0.7 % (ref 0.0–3.0)
Eosinophils Absolute: 0.2 K/uL (ref 0.0–0.7)
Eosinophils Relative: 3.2 % (ref 0.0–5.0)
HCT: 44.3 % (ref 39.0–52.0)
Hemoglobin: 14.4 g/dL (ref 13.0–17.0)
Lymphocytes Relative: 23.8 % (ref 12.0–46.0)
Lymphs Abs: 1.2 K/uL (ref 0.7–4.0)
MCHC: 32.6 g/dL (ref 30.0–36.0)
MCV: 89.4 fl (ref 78.0–100.0)
Monocytes Absolute: 0.5 K/uL (ref 0.1–1.0)
Monocytes Relative: 9.8 % (ref 3.0–12.0)
Neutro Abs: 3.2 K/uL (ref 1.4–7.7)
Neutrophils Relative %: 62.5 % (ref 43.0–77.0)
Platelets: 226 K/uL (ref 150.0–400.0)
RBC: 4.95 Mil/uL (ref 4.22–5.81)
RDW: 14.2 % (ref 11.5–15.5)
WBC: 5.2 K/uL (ref 4.0–10.5)

## 2024-02-02 LAB — COMPREHENSIVE METABOLIC PANEL WITH GFR
ALT: 13 U/L (ref 0–53)
AST: 16 U/L (ref 0–37)
Albumin: 4.2 g/dL (ref 3.5–5.2)
Alkaline Phosphatase: 48 U/L (ref 39–117)
BUN: 21 mg/dL (ref 6–23)
CO2: 24 meq/L (ref 19–32)
Calcium: 8.8 mg/dL (ref 8.4–10.5)
Chloride: 103 meq/L (ref 96–112)
Creatinine, Ser: 0.91 mg/dL (ref 0.40–1.50)
GFR: 82.84 mL/min (ref >60.00)
Glucose, Bld: 97 mg/dL (ref 70–99)
Potassium: 4.4 meq/L (ref 3.5–5.1)
Sodium: 139 meq/L (ref 135–145)
Total Bilirubin: 0.6 mg/dL (ref 0.2–1.2)
Total Protein: 6.5 g/dL (ref 6.0–8.3)

## 2024-02-02 LAB — LIPID PANEL
Cholesterol: 154 mg/dL (ref 0–200)
HDL: 57.5 mg/dL (ref >39.00)
LDL Cholesterol: 82 mg/dL (ref 0–99)
NonHDL: 96.96
Total CHOL/HDL Ratio: 3
Triglycerides: 75 mg/dL (ref 0.0–149.0)
VLDL: 15 mg/dL (ref 0.0–40.0)

## 2024-02-02 LAB — TSH: TSH: 4.84 u[IU]/mL (ref 0.35–5.50)

## 2024-02-02 LAB — VITAMIN D 25 HYDROXY (VIT D DEFICIENCY, FRACTURES): VITD: 38.13 ng/mL (ref 30.00–100.00)

## 2024-02-02 LAB — HEMOGLOBIN A1C: Hgb A1c MFr Bld: 6.6 % — ABNORMAL HIGH (ref 4.6–6.5)

## 2024-02-02 LAB — PSA, MEDICARE: PSA: 0 ng/mL — ABNORMAL LOW (ref 0.10–4.00)

## 2024-02-02 NOTE — Progress Notes (Unsigned)
 Subjective:    Patient ID: William Lewis, male    DOB: 05-23-1951, 73 y.o.   MRN: [id number]     HPI Sadiq is here for a physical exam and his chronic medical problems.   Overall doing well.   He is concerned about his blood pressure-seems to be higher initially, but improves after a few minutes-BP 150/90 initially at home -> 140/90   -> 134/85   Medications and allergies reviewed with patient and updated if appropriate.  Current Outpatient Medications on File Prior to Visit  Medication Sig Dispense Refill   aspirin 81 MG tablet Take 81 mg by mouth daily.     Cholecalciferol (VITAMIN D3) 2000 UNITS TABS Take by mouth daily.     EPINEPHrine  0.3 mg/0.3 mL IJ SOAJ injection Inject 0.3 mg into the muscle as needed for anaphylaxis. 2 each 0   fish oil-omega-3 fatty acids 1000 MG capsule Take by mouth daily.     metoprolol  succinate (TOPROL -XL) 25 MG 24 hr tablet Take 1/2 tablet (12.5mg  total) by mouth twice a day 90 tablet 3   rosuvastatin  (CRESTOR ) 20 MG tablet Take 1 tablet (20 mg total) by mouth at bedtime. 90 tablet 3   No current facility-administered medications on file prior to visit.    Review of Systems  Constitutional:  Negative for fever.  Eyes:  Negative for visual disturbance.  Respiratory:  Negative for cough, shortness of breath and wheezing.   Cardiovascular:  Negative for chest pain, palpitations and leg swelling.  Gastrointestinal:  Negative for abdominal pain, blood in stool, constipation and diarrhea.       No gerd  Genitourinary:  Negative for difficulty urinating and dysuria.  Musculoskeletal:  Negative for arthralgias and back pain.  Skin:  Negative for rash.  Neurological:  Negative for light-headedness and headaches.  Psychiatric/Behavioral:  Negative for dysphoric mood. The patient is not nervous/anxious.        Objective:   Vitals:   02/03/24 0904  BP: (!) 142/80  Pulse: 71  Temp: 98.6 F (37 C)  SpO2: 93%   Filed Weights   02/03/24  0904  Weight: 198 lb (89.8 kg)   Body mass index is 26.85 kg/m.  BP Readings from Last 3 Encounters:  02/03/24 (!) 142/80  12/17/23 (!) 162/98  11/17/23 136/74    Wt Readings from Last 3 Encounters:  02/03/24 198 lb (89.8 kg)  01/22/24 199 lb (90.3 kg)  11/17/23 198 lb (89.8 kg)      Physical Exam Constitutional: He appears well-developed and well-nourished. No distress.  HENT:  Head: Normocephalic and atraumatic.  Right Ear: External ear normal.  Left Ear: External ear normal.  Normal ear canals and TM b/l  Mouth/Throat: Oropharynx is clear and moist. Eyes: Conjunctivae and EOM are normal.  Neck: Neck supple. No tracheal deviation present. No thyromegaly present.  No carotid bruit  Cardiovascular: Normal rate, regular rhythm, normal heart sounds and intact distal pulses.   No murmur heard.  No lower extremity edema. Pulmonary/Chest: Effort normal and breath sounds normal. No respiratory distress. He has no wheezes. He has no rales.  Abdominal: Soft. He exhibits no distension. There is no tenderness.  Genitourinary: deferred  Lymphadenopathy:   He has no cervical adenopathy.  Skin: Skin is warm and dry. He is not diaphoretic.  Psychiatric: He has a normal mood and affect. His behavior is normal.         Assessment & Plan:   Physical exam: Screening blood work-reviewed  blood work you done yesterday Exercise   regular- swim daily, bike, tennis, golf Weight  normal Substance abuse   none   Reviewed recommended immunizations.   Health Maintenance  Topic Date Due   INFLUENZA VACCINE  01/09/2024   COVID-19 Vaccine (9 - 2024-25 season) 03/22/2024   Medicare Annual Wellness (AWV)  01/21/2025   DTaP/Tdap/Td (3 - Td or Tdap) 10/27/2028   Pneumococcal Vaccine: 50+ Years  Completed   Hepatitis C Screening  Completed   Zoster Vaccines- Shingrix  Completed   HPV VACCINES  Aged Out   Meningococcal B Vaccine  Aged Out   Colonoscopy  Discontinued     See Problem  List for Assessment and Plan of chronic medical problems.

## 2024-02-02 NOTE — Patient Instructions (Addendum)
 Medications changes include :   None    A referral was ordered and someone will call you to schedule an appointment.     Return in about 1 year (around 02/02/2025) for Physical Exam.    Health Maintenance, Male Adopting a healthy lifestyle and getting preventive care are important in promoting health and wellness. Ask your health care provider about: The right schedule for you to have regular tests and exams. Things you can do on your own to prevent diseases and keep yourself healthy. What should I know about diet, weight, and exercise? Eat a healthy diet  Eat a diet that includes plenty of vegetables, fruits, low-fat dairy products, and lean protein. Do not eat a lot of foods that are high in solid fats, added sugars, or sodium. Maintain a healthy weight Body mass index (BMI) is a measurement that can be used to identify possible weight problems. It estimates body fat based on height and weight. Your health care provider can help determine your BMI and help you achieve or maintain a healthy weight. Get regular exercise Get regular exercise. This is one of the most important things you can do for your health. Most adults should: Exercise for at least 150 minutes each week. The exercise should increase your heart rate and make you sweat (moderate-intensity exercise). Do strengthening exercises at least twice a week. This is in addition to the moderate-intensity exercise. Spend less time sitting. Even light physical activity can be beneficial. Watch cholesterol and blood lipids Have your blood tested for lipids and cholesterol at 75 years of age, then have this test every 5 years. You may need to have your cholesterol levels checked more often if: Your lipid or cholesterol levels are high. You are older than 76 years of age. You are at high risk for heart disease. What should I know about cancer screening? Many types of cancers can be detected early and may often be prevented.  Depending on your health history and family history, you may need to have cancer screening at various ages. This may include screening for: Colorectal cancer. Prostate cancer. Skin cancer. Lung cancer. What should I know about heart disease, diabetes, and high blood pressure? Blood pressure and heart disease High blood pressure causes heart disease and increases the risk of stroke. This is more likely to develop in people who have high blood pressure readings or are overweight. Talk with your health care provider about your target blood pressure readings. Have your blood pressure checked: Every 3-5 years if you are 20-89 years of age. Every year if you are 53 years old or older. If you are between the ages of 46 and 19 and are a current or former smoker, ask your health care provider if you should have a one-time screening for abdominal aortic aneurysm (AAA). Diabetes Have regular diabetes screenings. This checks your fasting blood sugar level. Have the screening done: Once every three years after age 66 if you are at a normal weight and have a low risk for diabetes. More often and at a younger age if you are overweight or have a high risk for diabetes. What should I know about preventing infection? Hepatitis B If you have a higher risk for hepatitis B, you should be screened for this virus. Talk with your health care provider to find out if you are at risk for hepatitis B infection. Hepatitis C Blood testing is recommended for: Everyone born from [birth year] through 1967. Anyone with known risk  factors for hepatitis C. Sexually transmitted infections (STIs) You should be screened each year for STIs, including gonorrhea and chlamydia, if: You are sexually active and are younger than 75 years of age. You are older than 75 years of age and your health care provider tells you that you are at risk for this type of infection. Your sexual activity has changed since you were last screened, and you are  at increased risk for chlamydia or gonorrhea. Ask your health care provider if you are at risk. Ask your health care provider about whether you are at high risk for HIV. Your health care provider may recommend a prescription medicine to help prevent HIV infection. If you choose to take medicine to prevent HIV, you should first get tested for HIV. You should then be tested every 3 months for as long as you are taking the medicine. Follow these instructions at home: Alcohol use Do not drink alcohol if your health care provider tells you not to drink. If you drink alcohol: Limit how much you have to 0-2 drinks a day. Know how much alcohol is in your drink. In the [place] one drink equals one 12 oz bottle of beer (355 mL), one 5 oz glass of wine (148 mL), or one 1 oz glass of hard liquor (44 mL). Lifestyle Do not use any products that contain nicotine or tobacco. These products include cigarettes, chewing tobacco, and vaping devices, such as e-cigarettes. If you need help quitting, ask your health care provider. Do not use street drugs. Do not share needles. Ask your health care provider for help if you need support or information about quitting drugs. General instructions Schedule regular health, dental, and eye exams. Stay current with your vaccines. Tell your health care provider if: You often feel depressed. You have ever been abused or do not feel safe at home. Summary Adopting a healthy lifestyle and getting preventive care are important in promoting health and wellness. Follow your health care provider's instructions about healthy diet, exercising, and getting tested or screened for diseases. Follow your health care provider's instructions on monitoring your cholesterol and blood pressure. This information is not intended to replace advice given to you by your health care provider. Make sure you discuss any questions you have with your health care provider. Document Revised: 10/16/2020  Document Reviewed: 10/16/2020 Elsevier Patient Education  2024 [employer].

## 2024-02-03 ENCOUNTER — Ambulatory Visit (INDEPENDENT_AMBULATORY_CARE_PROVIDER_SITE_OTHER): Admitting: Internal Medicine

## 2024-02-03 VITALS — BP 128/82 | HR 71 | Temp 98.6°F | Ht 72.0 in | Wt 198.0 lb

## 2024-02-03 DIAGNOSIS — I10 Essential (primary) hypertension: Secondary | ICD-10-CM | POA: Diagnosis not present

## 2024-02-03 DIAGNOSIS — Z0001 Encounter for general adult medical examination with abnormal findings: Secondary | ICD-10-CM

## 2024-02-03 DIAGNOSIS — E559 Vitamin D deficiency, unspecified: Secondary | ICD-10-CM | POA: Diagnosis not present

## 2024-02-03 DIAGNOSIS — Z8546 Personal history of malignant neoplasm of prostate: Secondary | ICD-10-CM | POA: Diagnosis not present

## 2024-02-03 DIAGNOSIS — E038 Other specified hypothyroidism: Secondary | ICD-10-CM

## 2024-02-03 DIAGNOSIS — E782 Mixed hyperlipidemia: Secondary | ICD-10-CM | POA: Diagnosis not present

## 2024-02-03 DIAGNOSIS — R7303 Prediabetes: Secondary | ICD-10-CM

## 2024-02-03 DIAGNOSIS — Z0000 Encounter for general adult medical examination without abnormal findings: Secondary | ICD-10-CM

## 2024-02-03 DIAGNOSIS — E1169 Type 2 diabetes mellitus with other specified complication: Secondary | ICD-10-CM | POA: Diagnosis not present

## 2024-02-03 MED ORDER — METOPROLOL SUCCINATE ER 25 MG PO TB24: ORAL_TABLET | ORAL | Status: DC

## 2024-02-03 NOTE — Assessment & Plan Note (Addendum)
 Chronic Blood pressure not ideally controlled Continue monitoring BP at home Increase metoprolol  XL to 12.5 mg in the morning and 25 mg in the evening. Discussed goal of BP and if still above goal can increase to 25 mg twice daily

## 2024-02-03 NOTE — Assessment & Plan Note (Addendum)
 Chronic Continue regular exercise and healthy diet  Lab Results  Component Value Date   LDLCALC 82 02/02/2024   LDL well-controlled Continue Crestor  20 mg daily

## 2024-02-03 NOTE — Assessment & Plan Note (Addendum)
 New We discussed that his sugars are in the diabetic range-no need for medication  Lab Results  Component Value Date   HGBA1C 6.6 (H) 02/02/2024   Continue low sugar/carbohydrate diet-he will work on decreasing alcohol use and any other sources of sugars Continue regular exercise

## 2024-02-03 NOTE — Assessment & Plan Note (Addendum)
 Chronic He is taking vitamin D  supplementation Vitamin D  level within normal range  Last vitamin D  Lab Results  Component Value Date   VD25OH 38.13 02/02/2024

## 2024-02-03 NOTE — Assessment & Plan Note (Signed)
 Chronic Following with urology No evidence of recurrence

## 2024-02-03 NOTE — Assessment & Plan Note (Addendum)
 Chronic Lab Results  Component Value Date   TSH 4.84 02/02/2024   Tsh in normal range No medication needed - monitor

## 2024-02-06 ENCOUNTER — Ambulatory Visit: Admitting: Internal Medicine

## 2024-02-12 ENCOUNTER — Ambulatory Visit: Payer: PPO | Admitting: Allergy & Immunology

## 2024-02-28 DIAGNOSIS — H1031 Unspecified acute conjunctivitis, right eye: Secondary | ICD-10-CM | POA: Diagnosis not present

## 2024-03-02 ENCOUNTER — Ambulatory Visit (INDEPENDENT_AMBULATORY_CARE_PROVIDER_SITE_OTHER): Admitting: Internal Medicine

## 2024-03-02 ENCOUNTER — Other Ambulatory Visit (HOSPITAL_COMMUNITY): Payer: Self-pay

## 2024-03-02 ENCOUNTER — Encounter: Payer: Self-pay | Admitting: Internal Medicine

## 2024-03-02 VITALS — BP 136/80 | HR 71 | Temp 98.4°F | Ht 72.0 in | Wt 196.0 lb

## 2024-03-02 DIAGNOSIS — H0100A Unspecified blepharitis right eye, upper and lower eyelids: Secondary | ICD-10-CM | POA: Diagnosis not present

## 2024-03-02 DIAGNOSIS — H0100B Unspecified blepharitis left eye, upper and lower eyelids: Secondary | ICD-10-CM | POA: Diagnosis not present

## 2024-03-02 DIAGNOSIS — H1033 Unspecified acute conjunctivitis, bilateral: Secondary | ICD-10-CM

## 2024-03-02 MED ORDER — METOPROLOL SUCCINATE ER 25 MG PO TB24
ORAL_TABLET | ORAL | 1 refills | Status: AC
  Filled 2024-03-02: qty 135, 90d supply, fill #0
  Filled 2024-06-14: qty 135, 90d supply, fill #1

## 2024-03-02 MED ORDER — DOXYCYCLINE HYCLATE 100 MG PO TABS
100.0000 mg | ORAL_TABLET | Freq: Two times a day (BID) | ORAL | 0 refills | Status: AC
  Filled 2024-03-02: qty 20, 10d supply, fill #0

## 2024-03-02 MED ORDER — ERYTHROMYCIN 5 MG/GM OP OINT
1.0000 | TOPICAL_OINTMENT | Freq: Every day | OPHTHALMIC | 0 refills | Status: DC
  Filled 2024-03-02: qty 3.5, 3d supply, fill #0

## 2024-03-02 MED ORDER — MOXIFLOXACIN HCL 0.5 % OP SOLN
1.0000 [drp] | Freq: Three times a day (TID) | OPHTHALMIC | 0 refills | Status: DC
  Filled 2024-03-02: qty 3, 20d supply, fill #0

## 2024-03-02 NOTE — Assessment & Plan Note (Signed)
 Acute Symptoms started just over a week ago Has combination of conjunctivitis and blepharitis No improvement with sulfacetamide  eyedrops Start moxifloxacin  eyedrops, erythromycin  ointment to eyelids at night, doxycycline  100 mg twice daily 7-10 days Continue washing gently eyelids with warm compresses

## 2024-03-02 NOTE — Progress Notes (Signed)
    Subjective:    Patient ID: William Lewis, male    DOB: May 30, 1951, 73 y.o.   MRN: [id number]      HPI Rathana is here for  Chief Complaint  Patient presents with   Eye Pain    Bilateral eye pain and redness   He just returned home from a vacation and has residual cold symptoms that are improving.  His eye symptoms started 9/19.  His right eye was crusted shut and had some crusting on the eyelashes and eyelids.  He is having watery discharge, redness inside the eye and right eye discomfort.  He started using an old prescription-sulfacetamide  sodium eyedrops  He went to an urgent care 9/20.  He denies any headaches, vision changes, photophobia, eye pain, pain with movement of the eyes, sensation of foreign body.  He was diagnosed with conjunctivitis and advised to continue using same eyedrops which have not expired.  He has not seen much improvement in the right eye and now his left eye is having similar symptoms.  He has itchiness all around his eyelids and they are slightly swollen.  His eyes are watery.  There is redness inside the eyes.  He has some blurry vision which could just be from the tearing with eyedrops.  He denies any eye pain.    Medications and allergies reviewed with patient and updated if appropriate.  Current Outpatient Medications on File Prior to Visit  Medication Sig Dispense Refill   aspirin 81 MG tablet Take 81 mg by mouth daily.     Cholecalciferol (VITAMIN D3) 2000 UNITS TABS Take by mouth daily.     EPINEPHrine  0.3 mg/0.3 mL IJ SOAJ injection Inject 0.3 mg into the muscle as needed for anaphylaxis. 2 each 0   fish oil-omega-3 fatty acids 1000 MG capsule Take by mouth daily.     metoprolol  succinate (TOPROL -XL) 25 MG 24 hr tablet Take 1/2 tablet in morning and take 1 tablet in the evening     rosuvastatin  (CRESTOR ) 20 MG tablet Take 1 tablet (20 mg total) by mouth at bedtime. 90 tablet 3   No current facility-administered medications on file prior to  visit.    Review of Systems     Objective:   Vitals:   03/02/24 1626  BP: 136/80  Pulse: 71  Temp: 98.4 F (36.9 C)  SpO2: 97%   BP Readings from Last 3 Encounters:  03/02/24 136/80  02/03/24 128/82  12/17/23 (!) 162/98   Wt Readings from Last 3 Encounters:  03/02/24 196 lb (88.9 kg)  02/03/24 198 lb (89.8 kg)  01/22/24 199 lb (90.3 kg)   Body mass index is 26.58 kg/m.    Physical Exam Constitutional:      General: He is not in acute distress.    Appearance: Normal appearance. He is not ill-appearing.  HENT:     Head: Normocephalic and atraumatic.  Eyes:     Extraocular Movements: Extraocular movements intact.     Comments: Bilateral conjunctival erythema.  Watery discharge from right eye.  Right upper and lower eyelids slightly swollen and erythematous, pronounced at lateral eye.  Minimal swelling left upper and lower eyelids  Neurological:     Mental Status: He is alert.            Assessment & Plan:    See Problem List for Assessment and Plan of chronic medical problems.

## 2024-03-02 NOTE — Patient Instructions (Addendum)
    Medications changes include :   doxycycline  100 mg twice a day for 7-10 days.   Erythromycin  ointment at bedtime to eyelids.  Moxifloxacin  eye drops.           Return if symptoms worsen or fail to improve.

## 2024-03-26 ENCOUNTER — Encounter: Payer: Self-pay | Admitting: Internal Medicine

## 2024-04-14 DIAGNOSIS — R229 Localized swelling, mass and lump, unspecified: Secondary | ICD-10-CM | POA: Diagnosis not present

## 2024-04-14 DIAGNOSIS — R399 Unspecified symptoms and signs involving the genitourinary system: Secondary | ICD-10-CM | POA: Diagnosis not present

## 2024-04-21 ENCOUNTER — Other Ambulatory Visit: Payer: Self-pay

## 2024-04-21 DIAGNOSIS — R229 Localized swelling, mass and lump, unspecified: Secondary | ICD-10-CM

## 2024-04-27 ENCOUNTER — Ambulatory Visit: Admission: RE | Admit: 2024-04-27 | Discharge: 2024-04-27 | Disposition: A | Source: Ambulatory Visit

## 2024-04-27 DIAGNOSIS — R229 Localized swelling, mass and lump, unspecified: Secondary | ICD-10-CM

## 2024-05-18 DIAGNOSIS — N4341 Spermatocele of epididymis, single: Secondary | ICD-10-CM | POA: Diagnosis not present

## 2024-05-25 ENCOUNTER — Ambulatory Visit (INDEPENDENT_AMBULATORY_CARE_PROVIDER_SITE_OTHER): Admitting: Physician Assistant

## 2024-05-25 ENCOUNTER — Encounter (INDEPENDENT_AMBULATORY_CARE_PROVIDER_SITE_OTHER): Payer: Self-pay | Admitting: Physician Assistant

## 2024-05-25 VITALS — BP 136/78 | HR 57

## 2024-05-25 DIAGNOSIS — H6123 Impacted cerumen, bilateral: Secondary | ICD-10-CM | POA: Diagnosis not present

## 2024-05-25 DIAGNOSIS — H61893 Other specified disorders of external ear, bilateral: Secondary | ICD-10-CM | POA: Diagnosis not present

## 2024-05-25 NOTE — Progress Notes (Unsigned)
 Patient having BP managed. Patient explains that he is nervous and usually has a higher BP read at the doctor's office.

## 2024-05-26 NOTE — Progress Notes (Signed)
 Dear Dr. Geofm, Here is my assessment for our mutual patient, William Lewis. Thank you for allowing me the opportunity to care for your patient. Please do not hesitate to contact me should you have any other questions. Sincerely, Chyrl Cohen PA-C  Otolaryngology Clinic Note Referring provider: Dr. Geofm HPI:  William Lewis is a 73 y.o. male kindly referred by Dr. Geofm   Discussed the use of AI scribe software for clinical note transcription with the patient, who gave verbal consent to proceed.  History of Present Illness    William Lewis is a 73 year old male who presents with left ear blockage and wax impaction.  Patient was last seen in the office on 12/17/2023.  Below is a recap of the encounter.   The patient is a 73 year old gentleman presenting today for evaluation of otitis externa.  The patient notes that he is an avid swimmer, he has had occasional episodes of otitis externa that were treated with otic drops.  He notes that in June he did develop a another right sided episode of otitis externa.  He was placed on cortisporin  drops which seem to alleviate his symptoms.  He denies any significant hearing changes.  He denies any significant pain.  No history of head or neck surgery.  He is not d    Update 05/26/2024  He has experienced intermittent blockage in his left ear over the past few days, describing the sensation as his ear being 'stopped up' and 'encrusted' when touched. He reports that yesterday his ear was blocked in the morning but cleared up later in the day. As of today, his ear has remained mostly clear.  This is the first issue with his ear since July, approximately six months ago. He attributes his previous success in avoiding ear problems to using earplugs and a band while swimming. He notes that the blockage sometimes makes it harder to hear, particularly during social situations such as dining out. He recalls a significant amount of wax coming out of his ear right  before it became completely blocked.  No itching in the ear, but he mentions that it feels dry. He has not experienced any pain associated with the blockage. He swims regularly and uses earplugs and a band to prevent ear issues.       Independent Review of Additional Tests or Records:  None   PMH/Meds/All/SocHx/FamHx/ROS:   Past Medical History:  Diagnosis Date   ASD (atrial septal defect) 06/10/2005   repaired with PFO in [place]   Cancer [facility])    Echocardiogram abnormal 06/11/2011   EF 50-55%, normal diastolic parameters, mild mitral regur. with his post leaflet of MV calcified   History of stress test 06/10/2006   neg.for ischemia   HTN (hypertension)    MVP (mitral valve prolapse) 06/10/2005   mitral valve repair in [place]   Other and unspecified hyperlipidemia    Dr Charlena Sor     Past Surgical History:  Procedure Laterality Date   CARDIAC CATHETERIZATION  2007   EF 65-70%, significant mitral prolapse wth 4+ MR, normal coronary arteries    COLONOSCOPY  2005 ; 2010;2014   polyp 2010 , Dr Luis   HERNIA REPAIR  2007   I & D EXTREMITY     thumb, for infection   MITRAL VALVE REPAIR  2007   Robotic surgery @ [address] [place] , [place] and ASD repair and PFO repair   PROSTATE SURGERY  1995   TURP , Dr Mardy  Family History  Problem Relation Age of Onset   Cancer Mother 61       ? intra-abdominal   Esophageal cancer Father    Hypertension Father    Cancer Father        throat ; smoker   Prostate cancer Father    Colon cancer Sister    AAA (abdominal aortic aneurysm) Maternal Grandmother    Stroke Paternal Grandmother        late 5s   Heart disease Neg Hx    Diabetes Neg Hx    Colon polyps Neg Hx    Rectal cancer Neg Hx    Stomach cancer Neg Hx      Social Connections: Moderately Integrated (01/22/2024)   Social Connection and Isolation Panel    Frequency of Communication with Friends and Family: More than three times a week    Frequency of Social  Gatherings with Friends and Family: More than three times a week    Attends Religious Services: More than 4 times per year    Active Member of [employer] or Organizations: Yes    Attends Banker Meetings: More than 4 times per year    Marital Status: Widowed     Current Medications[1]   Physical Exam:   BP 136/78   Pulse (!) 57   SpO2 96%   Pertinent Findings  CN II-XII grossly intact Bilateral EAC cerumen impaction bilateral EAC  No obviously palpable neck masses/lymphadenopathy/thyromegaly No respiratory distress or stridor   Seprately Identifiable Procedures:  Procedure: Bilateral ear microscopy and cerumen removal using microscope (CPT [phone]) - Mod 50 Pre-procedure diagnosis: bilateral cerumen impaction external auditory canals Post-procedure diagnosis: same Indication: bilateral cerumen impaction; given patient's otologic complaints and history as well as for improved and comprehensive examination of external ear and tympanic membrane, bilateral otologic examination using microscope was performed and impacted cerumen removed  Procedure: Patient was placed semi-recumbent. Both ear canals were examined using the microscope with findings above. Cerumen removed from bilateral external auditory canals using suction and currette with improvement in EAC examination and patency. Left: EAC was patent. TM was intact . Middle ear was aerated. Drainage: none Right: EAC was patent. TM was intact . Middle ear was aerated . Drainage: none Patient tolerated the procedure well.   Impression & Plans:  William Lewis is a 73 y.o. male with the following   Assessment and Plan    Impacted cerumen, bilateral  Intermittent blockage due to impacted cerumen, improved post-removal. No infection or significant discomfort. - Advised against ear drops  - Recommended monitoring for symptom recurrence and evaluation if symptoms return.  Dry skin of external ear canals, bilateral Bilateral dry  skin likely due to chronic swimming, earplug use, and environmental dryness. No significant itching. - Apply Vaseline to the base of both ears to moisturize and prevent dryness.         - f/u PRN   Thank you for allowing me the opportunity to care for your patient. Please do not hesitate to contact me should you have any other questions.  Sincerely, Chyrl Cohen PA-C [facility] Phone: [phone] Fax: [phone]  05/26/2024, 1:43 PM        [1]  Current Outpatient Medications:    aspirin 81 MG tablet, Take 81 mg by mouth daily., Disp: , Rfl:    Cholecalciferol (VITAMIN D3) 2000 UNITS TABS, Take by mouth daily., Disp: , Rfl:    EPINEPHrine  0.3 mg/0.3 mL IJ SOAJ injection, Inject 0.3 mg  into the muscle as needed for anaphylaxis., Disp: 2 each, Rfl: 0   erythromycin  ophthalmic ointment, Place 1 Application into both eyes at bedtime., Disp: 3.5 g, Rfl: 0   fish oil-omega-3 fatty acids 1000 MG capsule, Take by mouth daily., Disp: , Rfl:    metoprolol  succinate (TOPROL -XL) 25 MG 24 hr tablet, Take 1 tablet in morning and take 1/2 tablet in the evening, Disp: 135 tablet, Rfl: 1   moxifloxacin  (VIGAMOX ) 0.5 % ophthalmic solution, Place 1 drop into both eyes 3 (three) times daily. Use for 5-7 days, Disp: 3 mL, Rfl: 0   rosuvastatin  (CRESTOR ) 20 MG tablet, Take 1 tablet (20 mg total) by mouth at bedtime., Disp: 90 tablet, Rfl: 3

## 2024-06-14 ENCOUNTER — Other Ambulatory Visit: Payer: Self-pay

## 2024-06-14 ENCOUNTER — Other Ambulatory Visit (HOSPITAL_COMMUNITY): Payer: Self-pay

## 2024-06-14 ENCOUNTER — Telehealth: Payer: Self-pay | Admitting: Cardiovascular Disease

## 2024-06-14 MED ORDER — AMOXICILLIN 500 MG PO CAPS
2000.0000 mg | ORAL_CAPSULE | Freq: Once | ORAL | 2 refills | Status: AC
  Filled 2024-06-14: qty 4, 1d supply, fill #0

## 2024-06-14 NOTE — Telephone Encounter (Signed)
" °*  STAT* If patient is at the pharmacy, call can be transferred to refill team.   1. Which medications need to be refilled? (please list name of each medication and dose if known)  rosuvastatin  (CRESTOR ) 20 MG tablet   2. Would you like to learn more about the convenience, safety, & potential cost savings by using the [facility]? no   3. Are you open to using the [facility] (Type [facility]. no   4. Which pharmacy/location (including street and city if local pharmacy) is medication to be sent to?  [facility]   5. Do they need a 30 day or 90 day supply? 90 day   "

## 2024-06-15 ENCOUNTER — Other Ambulatory Visit (HOSPITAL_COMMUNITY): Payer: Self-pay

## 2024-06-15 MED ORDER — AMOXICILLIN 500 MG PO CAPS
2000.0000 mg | ORAL_CAPSULE | ORAL | 2 refills | Status: AC
  Filled 2024-06-15: qty 16, 4d supply, fill #0

## 2024-06-16 ENCOUNTER — Ambulatory Visit: Attending: Cardiovascular Disease | Admitting: Cardiovascular Disease

## 2024-06-16 ENCOUNTER — Other Ambulatory Visit (HOSPITAL_COMMUNITY): Payer: Self-pay

## 2024-06-16 ENCOUNTER — Encounter: Payer: Self-pay | Admitting: Cardiovascular Disease

## 2024-06-16 VITALS — BP 136/76 | HR 64 | Ht 72.0 in | Wt 209.0 lb

## 2024-06-16 DIAGNOSIS — Z8774 Personal history of (corrected) congenital malformations of heart and circulatory system: Secondary | ICD-10-CM | POA: Diagnosis not present

## 2024-06-16 DIAGNOSIS — Z9889 Other specified postprocedural states: Secondary | ICD-10-CM | POA: Diagnosis not present

## 2024-06-16 DIAGNOSIS — I77810 Thoracic aortic ectasia: Secondary | ICD-10-CM | POA: Diagnosis not present

## 2024-06-16 DIAGNOSIS — E7800 Pure hypercholesterolemia, unspecified: Secondary | ICD-10-CM

## 2024-06-16 DIAGNOSIS — I10 Essential (primary) hypertension: Secondary | ICD-10-CM

## 2024-06-16 MED ORDER — ROSUVASTATIN CALCIUM 20 MG PO TABS
20.0000 mg | ORAL_TABLET | Freq: Every day | ORAL | 3 refills | Status: AC
  Filled 2024-06-16: qty 90, 90d supply, fill #0

## 2024-06-16 NOTE — Patient Instructions (Signed)
 Medication Instructions:  Your physician recommends that you continue on your current medications as directed. Please refer to the Current Medication list given to you today.  *If you need a refill on your cardiac medications before your next appointment, please call your pharmacy*  Follow-Up: At [facility], you and your health needs are our priority.  As part of our continuing mission to provide you with exceptional heart care, our providers are all part of one team.  This team includes your primary Cardiologist (physician) and Advanced Practice Providers or APPs (Physician Assistants and Nurse Practitioners) who all work together to provide you with the care you need, when you need it.  Your next appointment:   1 year(s)   Provider:   Jerel Balding, MD

## 2024-06-20 DIAGNOSIS — I77810 Thoracic aortic ectasia: Secondary | ICD-10-CM | POA: Insufficient documentation

## 2024-06-20 NOTE — Progress Notes (Signed)
 " Cardiology Office Note   Date:  06/20/2024  ID:  William Lewis, DOB 1950/07/13, MRN [id number] PCP: Geofm Glade PARAS, MD  [facility] Providers Cardiologist:  Jerel Balding, MD     History of Present Illness William Lewis is a 74 y.o. male with a history of mitral valve repair in 07-26-2005 (MVP with flail P2 scallop, also had closure of a ASD and closure of his left atrial appendage), returning for follow-up to establish new cardiology care after Dr. Joesphine retirement.  He has hypercholesterolemia, controlled on statin. Coronary angiography before mitral valve surgery showed normal coronaries.  He has a mildly dilated ascending aorta at 42 mm.  He also has a history of early onset prostate cancer and underwent curative surgery at age 54.  He is doing great and is very physically active.  He swims 30 minutes a day.  He plays doubles tennis with his wife or friends.  He plays golf and rides his bicycle.  He recently swam over 2000 yards and his Apple watch showed an average heart rate of 91-95 bpm.  The patient specifically denies any chest pain at rest or with exertion, dyspnea at rest or with exertion, orthopnea, paroxysmal nocturnal dyspnea, syncope, palpitations, focal neurological deficits, intermittent claudication, lower extremity edema, unexplained weight gain, cough, hemoptysis or wheezing.  Echo in 2025-02-16showed EF 55-60%, mean mitral gradient 2 mm Hg at 65 bpm,  trivial MR, mild ascending aorta dilation 41 mm.  His wife of over 50 years passed away in 2021/07/26 from metastatic breast cancer, but he has reconnected with a high school friend who is also a widow and they recently were married.  Studies Reviewed  Echo 07/04/2023  EKG Interpretation Date/Time:  Wednesday June 16 2024 14:44:30 EST Ventricular Rate:  64 PR Interval:  174 QRS Duration:  78 QT Interval:  424 QTC Calculation: 437 R Axis:   -22  Text Interpretation: Sinus rhythm with Premature  supraventricular complexes When compared with ECG of 27-May-2023 14:51, Ectopic atrial rhythm has replaced Sinus rhythm Nonspecific T wave abnormality now evident in Inferior leads Confirmed by Dwayne Begay ([id number]) on 06/16/2024 3:22:02 PM    Lipid Panel     Component Value Date/Time   CHOL 154 02/02/2024 0824   CHOL 161 05/03/2020 0900   TRIG 75.0 02/02/2024 0824   HDL 57.50 02/02/2024 0824   HDL 52 05/03/2020 0900   CHOLHDL 3 02/02/2024 0824   VLDL 15.0 02/02/2024 0824   LDLCALC 82 02/02/2024 0824   LDLCALC 82 05/03/2020 0900   LDLDIRECT 90.0 06/13/2021 0849   LABVLDL 27 05/03/2020 0900    Risk Assessment/Calculations        Physical Exam VS:  BP 136/76 (BP Location: Left Arm, Patient Position: Sitting, Cuff Size: Normal)   Pulse 64   Ht 6' (1.829 m)   Wt 209 lb (94.8 kg)   SpO2 98%   BMI 28.35 kg/m        Wt Readings from Last 3 Encounters:  06/16/24 209 lb (94.8 kg)  03/02/24 196 lb (88.9 kg)  02/03/24 198 lb (89.8 kg)    GEN: Well nourished, well developed in no acute distress NECK: No JVD; No carotid bruits CARDIAC: Occasional ectopy on a background of RRR, no murmurs, rubs, gallops RESPIRATORY:  Clear to auscultation without rales, wheezing or rhonchi  ABDOMEN: Soft, non-tender, non-distended EXTREMITIES:  No edema; No deformity   ASSESSMENT AND PLAN MV repair: Excellent long-term result.  Reminded him of the  need for endocarditis prophylaxis, although the risk is lower with a well-healed chronic annuloplasty ring. Recheck echo 2027. PACs: The 1 possible long-term complication of his successful heart surgery would be the development of atrial fibrillation as his age advances.  Showed him how to turn on the irregular rhythm notification on his smart watch and how to send ECG samples via MyChart.  Due to his age, he would qualify for anticoagulation if he develops atrial fibrillation. HLP: adequate control on statin. Continue. Asc Ao dil: very mild.  May choose  to confirm with CTA if it becomes more significant.       Dispo: f/u in 1 year  Signed, Jerel Balding, MD   "

## 2024-08-05 ENCOUNTER — Ambulatory Visit: Admitting: Internal Medicine
